# Patient Record
Sex: Male | Born: 1950
Health system: Southern US, Community
[De-identification: ages and names within clinical notes are randomized; demographics above are authoritative.]

## PROBLEM LIST (undated history)

## (undated) DIAGNOSIS — I10 Essential (primary) hypertension: Secondary | ICD-10-CM

## (undated) DIAGNOSIS — K644 Residual hemorrhoidal skin tags: Secondary | ICD-10-CM

## (undated) HISTORY — DX: Residual hemorrhoidal skin tags: K64.4

## (undated) HISTORY — DX: Essential (primary) hypertension: I10

## (undated) HISTORY — PX: APPENDECTOMY: SHX54

## (undated) HISTORY — PX: INGUINAL HERNIA REPAIR: SUR1180

## (undated) HISTORY — PX: KNEE SURGERY: SHX244

---

## 2004-12-18 ENCOUNTER — Ambulatory Visit (HOSPITAL_BASED_OUTPATIENT_CLINIC_OR_DEPARTMENT_OTHER): Admission: RE | Admit: 2004-12-18 | Discharge: 2004-12-18 | Payer: Self-pay | Admitting: General Surgery

## 2004-12-18 ENCOUNTER — Ambulatory Visit (HOSPITAL_COMMUNITY): Admission: RE | Admit: 2004-12-18 | Discharge: 2004-12-18 | Payer: Self-pay | Admitting: General Surgery

## 2010-12-07 ENCOUNTER — Emergency Department (HOSPITAL_COMMUNITY)
Admission: EM | Admit: 2010-12-07 | Discharge: 2010-12-07 | Disposition: A | Discharge status: Home / Self Care | Payer: Self-pay | Attending: Emergency Medicine | Admitting: Emergency Medicine

## 2010-12-07 DIAGNOSIS — K644 Residual hemorrhoidal skin tags: Secondary | ICD-10-CM | POA: Insufficient documentation

## 2010-12-07 DIAGNOSIS — K921 Melena: Secondary | ICD-10-CM | POA: Insufficient documentation

## 2010-12-07 DIAGNOSIS — K5900 Constipation, unspecified: Secondary | ICD-10-CM | POA: Insufficient documentation

## 2010-12-07 DIAGNOSIS — K6289 Other specified diseases of anus and rectum: Secondary | ICD-10-CM | POA: Insufficient documentation

## 2010-12-08 ENCOUNTER — Encounter: Payer: Self-pay | Admitting: Internal Medicine

## 2010-12-30 ENCOUNTER — Encounter: Payer: Self-pay | Admitting: Internal Medicine

## 2010-12-30 ENCOUNTER — Ambulatory Visit (INDEPENDENT_AMBULATORY_CARE_PROVIDER_SITE_OTHER): Payer: Self-pay | Admitting: Internal Medicine

## 2010-12-30 DIAGNOSIS — K625 Hemorrhage of anus and rectum: Secondary | ICD-10-CM

## 2010-12-30 DIAGNOSIS — K219 Gastro-esophageal reflux disease without esophagitis: Secondary | ICD-10-CM | POA: Insufficient documentation

## 2010-12-30 DIAGNOSIS — K644 Residual hemorrhoidal skin tags: Secondary | ICD-10-CM

## 2010-12-30 DIAGNOSIS — K5909 Other constipation: Secondary | ICD-10-CM | POA: Insufficient documentation

## 2010-12-30 MED ORDER — PEG-KCL-NACL-NASULF-NA ASC-C 100 G PO SOLR: 1.0000 | Freq: Once | ORAL | Status: DC

## 2010-12-30 NOTE — Progress Notes (Signed)
Subjective:    Patient ID: Wesley Bright, male    DOB: 1950-07-09, 60 y.o.   MRN: [id number]  HPI Wesley Bright is a 60 year old man who presents to clinic for the first time today accompanied by his daughter for evaluation of hemorrhoidal bleeding.  The patient was seen in the [facility] emergency department in late July for the same problem. At that time he reports he was having pain and bright red rectal bleeding with bowel movement. He was given a prescription for ProctoFoam and reports that this helped his symptoms. He has since stopped having rectal bleeding and is no longer having painful defecation.  He is no longer using ProctoFoam but he is using Preparation H as needed.  At that time he was also started on MiraLax 17 g a day and he reports this helps with constipation.  He reports that he is still having trouble with constipation and reports that his stools are small. He reports that he is having bowel movements 3-4 days per week, having prior daily bowel movement. He is not taking the MiraLax on a regular basis and he is at times using a mint tea to help with his constipation. He also reports intermittent thin stools and the feeling of abdominal fullness and bloating.  He feels like the changes in his bowel habits or diet related. He reports when he eats food his wife cooks he usually has regular bowel movements however when he eats "American food" he becomes irregular. He has not had nausea vomiting or any abdominal pain. His appetite remains good he has not lost weight. He does report intermittent heartburn but prior to the last year he was having heartburn more frequently. No dysphagia or odynophagia.  Of note, he does report years of trouble with hemorrhoids but prior to July 2012 bleeding and pain were never an issue.   Review of Systems  Constitutional: Positive for fever. Negative for chills and fatigue.  Eyes: Negative for pain and visual disturbance.  Respiratory: Negative for cough,  chest tightness and shortness of breath.   Cardiovascular: Negative for chest pain and leg swelling.  Gastrointestinal:       See HPI  Genitourinary: Positive for difficulty urinating. Negative for flank pain.  Musculoskeletal: Negative for back pain, joint swelling and arthralgias.  Skin: Negative for color change and rash.  Neurological: Negative for weakness, numbness and headaches.  Hematological: Does not bruise/bleed easily.  Psychiatric/Behavioral: Negative for dysphoric mood. The patient is not nervous/anxious.    SH: Works at the [employer]. 1/2 pk/day cigarettes. Prior history of heavy drinking but now only rarely drinks alcohol. Denies illicit drug use. FH: His father had esophageal cancer diagnosed in his late 55s, his brother had liver cancer in the setting of alcoholic cirrhosis. No report of colorectal cancer or polyps.     Objective:     Physical Exam  Constitutional: He is oriented to person, place, and time. He appears well-developed and well-nourished. No distress.  HENT:  Head: Normocephalic and atraumatic.  Mouth/Throat: No oropharyngeal exudate.  Eyes: EOM are normal. Pupils are equal, round, and reactive to light. No scleral icterus.  Neck: Normal range of motion. Neck supple. No tracheal deviation present.  Cardiovascular: Normal rate, regular rhythm and normal heart sounds.   Pulmonary/Chest: Effort normal and breath sounds normal. He has no wheezes.  Abdominal: Soft. Bowel sounds are normal. He exhibits no distension and no mass. There is no tenderness.  Musculoskeletal: He exhibits no edema.  Neurological: He  is alert and oriented to person, place, and time.  Skin: Skin is warm and dry. No rash noted.  Psychiatric: He has a normal mood and affect.       Assessment & Plan:  This is a 60 year old man with little past medical history who presents for evaluation of recent painful hemorrhoidal bleeding as well as change in bowel habits.  1. Hemorrhoidal  bleeding/change in stool - it is very likely that the pain and bleeding that he's been experiencing is related to hemorrhoids. However given his change in bowel habits along with his age we will proceed with colonoscopy.  I would like for him to go back on MiraLax 17 g a day in an attempt to avoid constipation. I think the bloating and fullness that he is feeling is related to constipation and not being able to effectively empty his bowels.  I have advised that he is taking this on a daily basis and still feels constipated and he can take it twice daily.  I will better evaluate his hemorrhoids at the colonoscopy and determine if additional medical therapy is needed.  2. GERD - the patient in the past has had daily symptoms but now has symptoms more intermittently. Given his long-standing history of gastroesophageal reflux and family history of esophageal cancer I will perform upper endoscopy on the same day as his colonoscopy. He can take over-the-counter H2 blocker such as famotidine as needed reflux.  Should symptoms become more daily then we would consider PPI therapy.  --EGD/colon --daily miralax

## 2010-12-30 NOTE — Patient Instructions (Signed)
You have been scheduled for an Upper Endoscopy/Colonoscopy for tomorrow.  Separate instructions given. Pick up your prep kit from your pharmacy.

## 2010-12-31 ENCOUNTER — Ambulatory Visit (AMBULATORY_SURGERY_CENTER): Payer: Self-pay | Admitting: Internal Medicine

## 2010-12-31 ENCOUNTER — Encounter: Payer: Self-pay | Admitting: Internal Medicine

## 2010-12-31 DIAGNOSIS — K2100 Gastro-esophageal reflux disease with esophagitis, without bleeding: Secondary | ICD-10-CM

## 2010-12-31 DIAGNOSIS — K219 Gastro-esophageal reflux disease without esophagitis: Secondary | ICD-10-CM

## 2010-12-31 DIAGNOSIS — K2940 Chronic atrophic gastritis without bleeding: Secondary | ICD-10-CM

## 2010-12-31 DIAGNOSIS — D126 Benign neoplasm of colon, unspecified: Secondary | ICD-10-CM

## 2010-12-31 DIAGNOSIS — K625 Hemorrhage of anus and rectum: Secondary | ICD-10-CM

## 2010-12-31 MED ORDER — SODIUM CHLORIDE 0.9 % IV SOLN: 500.0000 mL | INTRAVENOUS | Status: DC

## 2010-12-31 NOTE — Progress Notes (Signed)
PT STATES THAT HIS ABDOMEN IS CRAMPING. ENCOURAGED PT TO LAY ON LEFT SIDE, PULLS KNEES TO CHEST AND BARE DOWN TO PASS GAS. PT DID PASS MINIMAL GAS

## 2010-12-31 NOTE — Progress Notes (Signed)
MEDICINE TITRATED PER MD DURING PROCEDURE. ADDITIONAL FENTANYL OF ORDERED PER DR PYRTLE AFTER INITIAL 100 MCG FENTANYL AND 10MG   VERSED DUE TO PT MOANING AND YELLING OUT IN PAIN. EWM  PT WOKE AT THE END OF PROCEDURE AFTER SLEEPING AFTER REACHING CECUM, STATING HE IS IN PAIN STILL. DR PYRTLE QUESTIONED WHETHER HE WAS FEELING SCOPE IRRITATING HEMORRHOIDS. PT DID GRIMACE AND COMPLAIN OF PAIN WITH END OF PROCEDURE AND MD AWARE BUT NO FURTHER SEDATION GIVEN. AFTER PROCEDURE PT STATED HE WASN'T OK, AND REPOSITIONED TO LEFT SIDE. PT  ENCOURAGED TO PASS GAS. MAY BE A LANGUAGE BARRIER DUE TO [place] AND SPEAKS SOME BUT LITTLE ENGLISH. EWM

## 2010-12-31 NOTE — Progress Notes (Signed)
PT PASSED GAS MULTIPLE TIMES. PT STATES THE HIS CRAMPING IS MUCH BETTER, ALMOST RESOLVED.

## 2010-12-31 NOTE — Patient Instructions (Signed)
Please refer to your blue and neon green sheets for instructions regarding diet and activity for the rest of today.  You may resume your medications as you would normally take them.  You will receive a letter in the mail in about 2 weeks regarding the results of your pathology, if H Pylori results are positive, Dr Lauro Franklin office will contact you regarding treatment.

## 2011-01-01 ENCOUNTER — Telehealth: Payer: Self-pay | Admitting: Certified Registered Nurse Anesthetist

## 2011-01-01 NOTE — Telephone Encounter (Signed)
No answer

## 2011-01-05 ENCOUNTER — Encounter: Payer: Self-pay | Admitting: Internal Medicine

## 2011-11-11 ENCOUNTER — Encounter: Payer: Self-pay | Admitting: Internal Medicine

## 2012-08-30 ENCOUNTER — Encounter: Payer: Self-pay | Admitting: Internal Medicine

## 2016-05-18 DIAGNOSIS — C9590 Leukemia, unspecified not having achieved remission: Secondary | ICD-10-CM

## 2016-05-18 HISTORY — DX: Leukemia, unspecified not having achieved remission: C95.90

## 2016-12-07 ENCOUNTER — Ambulatory Visit (INDEPENDENT_AMBULATORY_CARE_PROVIDER_SITE_OTHER): Payer: BLUE CROSS/BLUE SHIELD | Admitting: Emergency Medicine

## 2016-12-07 ENCOUNTER — Encounter: Payer: Self-pay | Admitting: Emergency Medicine

## 2016-12-07 ENCOUNTER — Ambulatory Visit (INDEPENDENT_AMBULATORY_CARE_PROVIDER_SITE_OTHER): Payer: BLUE CROSS/BLUE SHIELD

## 2016-12-07 VITALS — BP 121/73 | HR 73 | Temp 97.7°F | Resp 16 | Ht 69.25 in | Wt 189.4 lb

## 2016-12-07 DIAGNOSIS — C9190 Lymphoid leukemia, unspecified not having achieved remission: Secondary | ICD-10-CM

## 2016-12-07 DIAGNOSIS — D72829 Elevated white blood cell count, unspecified: Secondary | ICD-10-CM | POA: Diagnosis not present

## 2016-12-07 DIAGNOSIS — R634 Abnormal weight loss: Secondary | ICD-10-CM | POA: Diagnosis not present

## 2016-12-07 DIAGNOSIS — R531 Weakness: Secondary | ICD-10-CM | POA: Diagnosis not present

## 2016-12-07 DIAGNOSIS — F17200 Nicotine dependence, unspecified, uncomplicated: Secondary | ICD-10-CM | POA: Insufficient documentation

## 2016-12-07 DIAGNOSIS — C9110 Chronic lymphocytic leukemia of B-cell type not having achieved remission: Secondary | ICD-10-CM

## 2016-12-07 LAB — POCT CBC
HCT, POC: 343.5 % — AB (ref 43.5–53.7)
Hemoglobin: 14 g/dL — AB (ref 14.1–18.1)
MCH, POC: 29.2 pg (ref 27–31.2)
MCHC: 32.3 g/dL (ref 31.8–35.4)
MCV: 90.5 fL (ref 80–97)
MPV: 7.8 fL (ref 0–99.8)
Platelet Count, POC: 131 10*3/uL — AB (ref 142–424)
RBC: 4.81 M/uL (ref 4.69–6.13)
RDW, POC: 14.2 %
WBC: 142.3 10*3/uL — AB (ref 4.6–10.2)

## 2016-12-07 LAB — POCT GLYCOSYLATED HEMOGLOBIN (HGB A1C): HEMOGLOBIN A1C: 5.4

## 2016-12-07 NOTE — Patient Instructions (Addendum)
IF you received an x-ray today, you will receive an invoice from [facility]. Please contact [facility] at [phone] with questions or concerns regarding your invoice.   IF you received labwork today, you will receive an invoice from [facility]. Please contact [facility] at [phone] with questions or concerns regarding your invoice.   Our billing staff will not be able to assist you with questions regarding bills from these companies.  You will be contacted with the lab results as soon as they are available. The fastest way to get your results is to activate your My Chart account. Instructions are located on the last page of this paperwork. If you have not heard from [place] regarding the results in 2 weeks, please contact this office.     Myelodysplastic Syndrome Myelodysplastic syndrome (MDS) is a group of cancers that affect blood cells. New blood cells are also called immature cells or stem cells. MDS starts in the bone marrow where new blood cells are made. The bone marrow makes:  Red blood cells. These carry oxygen.  White blood cells. These fight infection.  Platelets. These stop bleeding.  With MDS, some immature cells do not grow into adult blood cells. These cells will die before they reach maturity. Immature blood cells build up in the bone marrow and crowd out normal adult blood cells. This causes a low blood count and can affect how many healthy red blood cells, white blood cells, and platelets you have. If you have too few red blood cells, your blood may not have enough oxygen, and you will feel tired. If you do not have enough white blood cells, you might get infections often. If you have too few platelets, you may bruise or bleed easily. What are the causes? This condition may be caused by:  Being exposed to certain medicines or chemicals.  Radiation treatment.  In some cases, the cause may not be known. What increases the risk? This condition is  more likely to develop in:  Caucasians.  Males.  People who are 65 years of age or older.  People who have been treated with cancer medicines or radiation.  People who have been exposed to tobacco smoke, pesticides, lead, mercury, or the benzene chemical.  People with a family history of MDS.  People who inherit certain abnormal genes that lead to bone marrow problems.  What are the signs or symptoms? Symptoms often develop slowly over time. Symptoms depend on which blood cells are affected. Symptoms include:  Being tired all the time.  Feeling out of breath.  Having pale skin.  Getting infections often.  Having pain in the bones.  Feeling weak.  Having a fever often.  Bruising easily.  Bleeding that lasts longer than normal.  Having tiny spots of bleeding under the skin (petechiae).  How is this diagnosed? This condition is diagnosed based on your symptoms, medical history, and physical exam. You may also have tests, including:  A complete blood count. This test counts the number of red, white, and platelet blood cells.  Peripheral blood smear. This test checks the number and type of blood cells. It also checks their size and shape.  Bone marrow aspiration and biopsy. Aspiration involves using a needle to remove the liquid portion of the bone marrow, and a biopsy involves removing the bone marrow tissue. The samples removed will be examined under a microscope.  How is this treated? There is no cure for MDS. Treatment focuses on relieving your symptoms and  slowing down the production of immature blood cells. The type of treatment that is best for you depends on the type of MDS you have. Treatment may include:  Blood transfusions.  Chemotherapy. This is the use of medicines to kill cancer cells.  Targeted therapy. This treatment targets specific parts of cancer cells and the area around them to block the growth and spread of the cancer.  Immunotherapy or  biologic therapy. This treatment helps your body boost its own immune system and natural defenses to stop the growth and spread of cancer cells.  Medicines that: ? Slow down the number of immature blood cells that are made. ? Help immature cells develop into adult cells. ? Make more blood cells. ? Treat infections (antibiotics).  Bone marrow stem cell transplant. In this procedure, your stem cells will be replaced with healthy stem cells from a donor.  Follow these instructions at home: Medicines  Take over-the-counter and prescription medicines only as told by your health care provider.  Do not take aspirin unless your health care provider approves. Aspirin can make bleeding more likely.  If you were prescribed an antibiotic, take it as told by your health care provider. Do not stop taking the antibiotic even if you start to feel better. Lifestyle   Avoid dangerous activities that could lead to bleeding. Ask your health care provider what activities are safe for you.  Wash all fruits and vegetables before eating them. Make sure meat and eggs are well cooked. Do not eat raw fish and shellfish.  Wash your hands often with soap and water. If soap and water are not available, use hand sanitizer.  Do not use any tobacco products, such as cigarettes, chewing tobacco, and e-cigarettes. If you need help quitting, ask your health care provider. General instructions  Stay away from people who are sick.  Keep all follow-up visits as told by your health care provider. This is important.  Talk to your health care provider before having any medical or dental procedure. Contact a health care provider if:  You feel more tired than normal.  You have more bruising than usual.  You have trouble catching your breath.  You have any symptoms of infection, such as: ? Fever. ? Chills. ? Abnormal urinary symptoms. ? Cough. ? Body aches. ? Headache. ? Weakness. ? Dizziness. Get help right  away if:  Your symptoms get worse.  You have bleeding that does not stop.  You have trouble breathing.  You have chest pain. This information is not intended to replace advice given to you by your health care provider. Make sure you discuss any questions you have with your health care provider. Document Released: 11/03/2011 Document Revised: 12/28/2015 Document Reviewed: 05/19/2015 Elsevier Interactive Patient Education  [employer].

## 2016-12-07 NOTE — Progress Notes (Addendum)
Wesley Bright 66 y.o.   Chief Complaint  Patient presents with  . Fatigue    2-3 months  . Weight Loss    HISTORY OF PRESENT ILLNESS: This is a 66 y.o. male complaining of feeling tired and fatigued for 4-5 months along with weight loss, 15 lbs in 5 months.  HPI   Prior to Admission medications   Medication Sig Start Date End Date Taking? Authorizing Provider  ibuprofen (ADVIL,MOTRIN) 600 MG tablet Take 600 mg by mouth 3 (three) times daily with meals.     Yes [provider]  hydrocortisone 1 % cream Apply 1 application topically 2 (two) times daily.      [provider]  hydrocortisone-pramoxine (PROCTOFOAM HC) rectal foam Place 1 applicator rectally 3 (three) times daily.      [provider]  polyethylene glycol powder (GLYCOLAX/MIRALAX) powder Take 17 g by mouth daily.      [provider]    No Known Allergies  Patient Active Problem List   Diagnosis Date Noted  . GERD (gastroesophageal reflux disease) 12/30/2010  . Constipation 12/30/2010  . Hemorrhoids, external 12/30/2010    Past Medical History:  Diagnosis Date  . External hemorrhoid     Past Surgical History:  Procedure Laterality Date  . APPENDECTOMY    . INGUINAL HERNIA REPAIR     left  . KNEE SURGERY     Left Knee    Social History   Social History  . Marital status: Married    Spouse name: N/A  . Number of children: 2  . Years of education: N/A   Occupational History  . 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 Smoking status: Current Every Day Smoker    Packs/day: 0.50  . Smokeless tobacco: Never Used  . Alcohol use Yes     Comment: occasional  . Drug use: No  . Sexual activity: Not on file   Other Topics Concern  . Not on file   Social History Narrative  . No narrative on file    Family History  Problem Relation Age of Onset  . Liver cancer Brother   . Esophageal cancer Father      Review of Systems    Constitutional: Positive for malaise/fatigue and weight loss. Negative for chills and fever.  HENT: Negative.  Negative for congestion, ear pain, nosebleeds and sore throat.   Eyes: Negative.  Negative for blurred vision and double vision.  Respiratory: Negative.  Negative for cough, shortness of breath and wheezing.   Cardiovascular: Negative.  Negative for chest pain, claudication and leg swelling.  Gastrointestinal: Negative.  Negative for abdominal pain, blood in stool, diarrhea, nausea and vomiting.  Genitourinary: Negative.  Negative for dysuria and hematuria.  Musculoskeletal: Negative for back pain, myalgias and neck pain.  Skin: Negative for rash.  Neurological: Positive for weakness. Negative for dizziness, sensory change, focal weakness and headaches.  Endo/Heme/Allergies: Negative.   All other systems reviewed and are negative.  Vitals:   12/07/16 1324  BP: 121/73  Pulse: 73  Resp: 16  Temp: 97.7 F (36.5 C)     Physical Exam  Constitutional: He is oriented to person, place, and time. He appears well-developed and well-nourished.  HENT:  Head: Normocephalic and atraumatic.  Right Ear: External ear normal.  Left Ear: External ear normal.  Nose: Nose normal.  Mouth/Throat: Oropharynx is clear and moist.  Eyes: Pupils are equal, round, and reactive to light. Conjunctivae and EOM are  normal.  Neck: Normal range of motion. Neck supple. No JVD present.  Cardiovascular: Normal rate, regular rhythm, normal heart sounds and intact distal pulses.   Pulmonary/Chest: Effort normal and breath sounds normal.  Abdominal: Soft. Bowel sounds are normal. He exhibits no distension and no mass. There is no tenderness. No hernia.  Musculoskeletal: Normal range of motion.  Lymphadenopathy:    He has no cervical adenopathy.  Neurological: He is alert and oriented to person, place, and time. No sensory deficit. He exhibits normal muscle tone.  Skin: Skin is warm and dry. Capillary  refill takes less than 2 seconds. No rash noted.  Psychiatric: He has a normal mood and affect. His behavior is normal.  Vitals reviewed.  Dg Chest 2 View  Result Date: 12/07/2016 CLINICAL DATA:  Smoker.  Weight loss. EXAM: CHEST  2 VIEW COMPARISON:  06/06/2007 FINDINGS: Heart size is normal. There are no focal consolidations or pleural effusions. No pulmonary edema. Visualized osseous structures have a normal appearance. IMPRESSION: No evidence for acute cardiopulmonary abnormality. Electronically Signed   By: Nolon Nations M.D.   On: 12/07/2016 13:51   Results for orders placed or performed in visit on 12/07/16 (from the past 24 hour(s))  POCT glycosylated hemoglobin (Hb A1C)     Status: None   Collection Time: 12/07/16  2:04 PM  Result Value Ref Range   Hemoglobin A1C 5.4   POCT CBC     Status: Abnormal   Collection Time: 12/07/16  2:06 PM  Result Value Ref Range   WBC 142.3 (A) 4.6 - 10.2 K/uL   Lymph, poc  0.6 - 3.4   POC LYMPH PERCENT  10 - 50 %L   MID (cbc)  0 - 0.9   POC MID %  0 - 12 %M   POC Granulocyte  2 - 6.9   Granulocyte percent  37 - 80 %G   RBC 4.81 4.69 - 6.13 M/uL   Hemoglobin 14.0 (A) 14.1 - 18.1 g/dL   HCT, POC 343.5 (A) 43.5 - 53.7 %   MCV 90.5 80 - 97 fL   MCH, POC 29.2 27 - 31.2 pg   MCHC 32.3 31.8 - 35.4 g/dL   RDW, POC 14.2 %   Platelet Count, POC 131 (A) 142 - 424 K/uL   MPV 7.8 0 - 99.8 fL      ASSESSMENT & PLAN: Wesley Bright was seen today for fatigue and weight loss.  Diagnoses and all orders for this visit:  General weakness -     Comprehensive metabolic panel -     POCT CBC -     POCT glycosylated hemoglobin (Hb A1C) -     CBC with Differential/Platelet  Smoker -     DG Chest 2 View; Future  Loss of weight -     PSA  Leukocytosis, unspecified type Comments: leukemia most likely Orders: -     Ambulatory referral to Hematology -     CBC with Differential/Platelet  CLL (chronic lymphocytic leukemia) ([place])   Case also discussed  over the phone with daughter as requested by patient and wife. 12/08/16 CBC with Diff report reviewed. WBC's mostly atypical lymphocytes with smudge cells. Findings c/w CLL. Scheduled to see Dr. Lebron Conners (Oncology/Hematology) 7/31.   Patient Instructions       IF you received an x-ray today, you will receive an invoice from [facility]. Please contact [facility] at [phone] with questions or concerns regarding your invoice.   IF you received labwork today,  you will receive an invoice from [facility]. Please contact [facility] at [phone] with questions or concerns regarding your invoice.   Our billing staff will not be able to assist you with questions regarding bills from these companies.  You will be contacted with the lab results as soon as they are available. The fastest way to get your results is to activate your My Chart account. Instructions are located on the last page of this paperwork. If you have not heard from [place] regarding the results in 2 weeks, please contact this office.     Myelodysplastic Syndrome Myelodysplastic syndrome (MDS) is a group of cancers that affect blood cells. New blood cells are also called immature cells or stem cells. MDS starts in the bone marrow where new blood cells are made. The bone marrow makes:  Red blood cells. These carry oxygen.  White blood cells. These fight infection.  Platelets. These stop bleeding.  With MDS, some immature cells do not grow into adult blood cells. These cells will die before they reach maturity. Immature blood cells build up in the bone marrow and crowd out normal adult blood cells. This causes a low blood count and can affect how many healthy red blood cells, white blood cells, and platelets you have. If you have too few red blood cells, your blood may not have enough oxygen, and you will feel tired. If you do not have enough white blood cells, you might get infections often. If you have too few  platelets, you may bruise or bleed easily. What are the causes? This condition may be caused by:  Being exposed to certain medicines or chemicals.  Radiation treatment.  In some cases, the cause may not be known. What increases the risk? This condition is more likely to develop in:  Caucasians.  Males.  People who are 54 years of age or older.  People who have been treated with cancer medicines or radiation.  People who have been exposed to tobacco smoke, pesticides, lead, mercury, or the benzene chemical.  People with a family history of MDS.  People who inherit certain abnormal genes that lead to bone marrow problems.  What are the signs or symptoms? Symptoms often develop slowly over time. Symptoms depend on which blood cells are affected. Symptoms include:  Being tired all the time.  Feeling out of breath.  Having pale skin.  Getting infections often.  Having pain in the bones.  Feeling weak.  Having a fever often.  Bruising easily.  Bleeding that lasts longer than normal.  Having tiny spots of bleeding under the skin (petechiae).  How is this diagnosed? This condition is diagnosed based on your symptoms, medical history, and physical exam. You may also have tests, including:  A complete blood count. This test counts the number of red, white, and platelet blood cells.  Peripheral blood smear. This test checks the number and type of blood cells. It also checks their size and shape.  Bone marrow aspiration and biopsy. Aspiration involves using a needle to remove the liquid portion of the bone marrow, and a biopsy involves removing the bone marrow tissue. The samples removed will be examined under a microscope.  How is this treated? There is no cure for MDS. Treatment focuses on relieving your symptoms and slowing down the production of immature blood cells. The type of treatment that is best for you depends on the type of MDS you have. Treatment may  include:  Blood transfusions.  Chemotherapy. This is the  use of medicines to kill cancer cells.  Targeted therapy. This treatment targets specific parts of cancer cells and the area around them to block the growth and spread of the cancer.  Immunotherapy or biologic therapy. This treatment helps your body boost its own immune system and natural defenses to stop the growth and spread of cancer cells.  Medicines that: ? Slow down the number of immature blood cells that are made. ? Help immature cells develop into adult cells. ? Make more blood cells. ? Treat infections (antibiotics).  Bone marrow stem cell transplant. In this procedure, your stem cells will be replaced with healthy stem cells from a donor.  Follow these instructions at home: Medicines  Take over-the-counter and prescription medicines only as told by your health care provider.  Do not take aspirin unless your health care provider approves. Aspirin can make bleeding more likely.  If you were prescribed an antibiotic, take it as told by your health care provider. Do not stop taking the antibiotic even if you start to feel better. Lifestyle   Avoid dangerous activities that could lead to bleeding. Ask your health care provider what activities are safe for you.  Wash all fruits and vegetables before eating them. Make sure meat and eggs are well cooked. Do not eat raw fish and shellfish.  Wash your hands often with soap and water. If soap and water are not available, use hand sanitizer.  Do not use any tobacco products, such as cigarettes, chewing tobacco, and e-cigarettes. If you need help quitting, ask your health care provider. General instructions  Stay away from people who are sick.  Keep all follow-up visits as told by your health care provider. This is important.  Talk to your health care provider before having any medical or dental procedure. Contact a health care provider if:  You feel more tired than  normal.  You have more bruising than usual.  You have trouble catching your breath.  You have any symptoms of infection, such as: ? Fever. ? Chills. ? Abnormal urinary symptoms. ? Cough. ? Body aches. ? Headache. ? Weakness. ? Dizziness. Get help right away if:  Your symptoms get worse.  You have bleeding that does not stop.  You have trouble breathing.  You have chest pain. This information is not intended to replace advice given to you by your health care provider. Make sure you discuss any questions you have with your health care provider. Document Released: 11/03/2011 Document Revised: 12/28/2015 Document Reviewed: 05/19/2015 Elsevier Interactive Patient Education  2018 [employer].     Agustina Caroli, MD [facility]

## 2016-12-08 ENCOUNTER — Telehealth: Payer: Self-pay | Admitting: Hematology and Oncology

## 2016-12-08 ENCOUNTER — Encounter: Payer: Self-pay | Admitting: Hematology and Oncology

## 2016-12-08 ENCOUNTER — Encounter: Payer: Self-pay | Admitting: Emergency Medicine

## 2016-12-08 LAB — CBC WITH DIFFERENTIAL/PLATELET
Basophils Absolute: 0.6 10*3/uL — ABNORMAL HIGH (ref 0.0–0.2)
Basos: 0 %
EOS (ABSOLUTE): 0.3 10*3/uL (ref 0.0–0.4)
Eos: 0 %
Hematocrit: 42.6 % (ref 37.5–51.0)
Hemoglobin: 13.3 g/dL (ref 13.0–17.7)
IMMATURE GRANS (ABS): 0 10*3/uL (ref 0.0–0.1)
Immature Granulocytes: 0 %
LYMPHS: 96 %
Lymphocytes Absolute: 140.2 10*3/uL — ABNORMAL HIGH (ref 0.7–3.1)
MCH: 29.2 pg (ref 26.6–33.0)
MCHC: 31.2 g/dL — ABNORMAL LOW (ref 31.5–35.7)
MCV: 94 fL (ref 79–97)
Monocytes Absolute: 2.4 10*3/uL — ABNORMAL HIGH (ref 0.1–0.9)
Monocytes: 2 %
NEUTROS ABS: 2.9 10*3/uL (ref 1.4–7.0)
Neutrophils: 2 %
PLATELETS: 123 10*3/uL — AB (ref 150–379)
RBC: 4.55 x10E6/uL (ref 4.14–5.80)
RDW: 14.4 % (ref 12.3–15.4)
WBC: 146.5 10*3/uL (ref 3.4–10.8)

## 2016-12-08 LAB — COMPREHENSIVE METABOLIC PANEL
A/G RATIO: 2.2 (ref 1.2–2.2)
ALT: 14 IU/L (ref 0–44)
AST: 17 IU/L (ref 0–40)
Albumin: 4.3 g/dL (ref 3.6–4.8)
Alkaline Phosphatase: 107 IU/L (ref 39–117)
BILIRUBIN TOTAL: 0.3 mg/dL (ref 0.0–1.2)
BUN/Creatinine Ratio: 8 — ABNORMAL LOW (ref 10–24)
BUN: 11 mg/dL (ref 8–27)
CHLORIDE: 108 mmol/L — AB (ref 96–106)
CO2: 22 mmol/L (ref 20–29)
Calcium: 8.8 mg/dL (ref 8.6–10.2)
Creatinine, Ser: 1.3 mg/dL — ABNORMAL HIGH (ref 0.76–1.27)
GFR calc non Af Amer: 58 mL/min/{1.73_m2} — ABNORMAL LOW (ref >59)
GFR, EST AFRICAN AMERICAN: 67 mL/min/{1.73_m2} (ref >59)
Globulin, Total: 2 g/dL (ref 1.5–4.5)
Glucose: 91 mg/dL (ref 65–99)
POTASSIUM: 4.7 mmol/L (ref 3.5–5.2)
Sodium: 145 mmol/L — ABNORMAL HIGH (ref 134–144)
Total Protein: 6.3 g/dL (ref 6.0–8.5)

## 2016-12-08 LAB — PSA: Prostate Specific Ag, Serum: 5.1 ng/mL — ABNORMAL HIGH (ref 0.0–4.0)

## 2016-12-08 NOTE — Telephone Encounter (Signed)
Appt has been scheduled for the pt to see Dr. Lebron Conners on 7/31 at 10am. Aware to arrive 30 minutes early. Letter mailed.

## 2016-12-08 NOTE — Telephone Encounter (Signed)
Please look into Hematology/Oncology referral and schedule ASAP. Call patient to let them know.

## 2016-12-09 DIAGNOSIS — C9110 Chronic lymphocytic leukemia of B-cell type not having achieved remission: Secondary | ICD-10-CM | POA: Insufficient documentation

## 2016-12-15 ENCOUNTER — Ambulatory Visit (HOSPITAL_BASED_OUTPATIENT_CLINIC_OR_DEPARTMENT_OTHER): Payer: BLUE CROSS/BLUE SHIELD | Admitting: Hematology and Oncology

## 2016-12-15 ENCOUNTER — Telehealth: Payer: Self-pay

## 2016-12-15 ENCOUNTER — Other Ambulatory Visit (HOSPITAL_COMMUNITY)
Admission: RE | Admit: 2016-12-15 | Discharge: 2016-12-15 | Disposition: A | Discharge status: Home / Self Care | Payer: BLUE CROSS/BLUE SHIELD | Source: Ambulatory Visit | Attending: Hematology and Oncology | Admitting: Hematology and Oncology

## 2016-12-15 ENCOUNTER — Ambulatory Visit (HOSPITAL_BASED_OUTPATIENT_CLINIC_OR_DEPARTMENT_OTHER): Payer: BLUE CROSS/BLUE SHIELD

## 2016-12-15 ENCOUNTER — Encounter: Payer: Self-pay | Admitting: Hematology and Oncology

## 2016-12-15 VITALS — BP 139/69 | HR 71 | Temp 98.6°F | Resp 18 | Ht 70.5 in | Wt 191.9 lb

## 2016-12-15 DIAGNOSIS — C9190 Lymphoid leukemia, unspecified not having achieved remission: Secondary | ICD-10-CM | POA: Insufficient documentation

## 2016-12-15 DIAGNOSIS — C9110 Chronic lymphocytic leukemia of B-cell type not having achieved remission: Secondary | ICD-10-CM | POA: Diagnosis not present

## 2016-12-15 LAB — CBC & DIFF AND RETIC
BASO%: 0.4 % (ref 0.0–2.0)
Basophils Absolute: 0.6 10*3/uL — ABNORMAL HIGH (ref 0.0–0.1)
EOS%: 0.1 % (ref 0.0–7.0)
Eosinophils Absolute: 0.2 10*3/uL (ref 0.0–0.5)
HCT: 43.7 % (ref 38.4–49.9)
HGB: 13.7 g/dL (ref 13.0–17.1)
Immature Retic Fract: 2.3 % — ABNORMAL LOW (ref 3.00–10.60)
LYMPH%: 97.7 % — ABNORMAL HIGH (ref 14.0–49.0)
MCH: 29.5 pg (ref 27.2–33.4)
MCHC: 31.4 g/dL — ABNORMAL LOW (ref 32.0–36.0)
MCV: 94.2 fL (ref 79.3–98.0)
MONO#: 0.2 10*3/uL (ref 0.1–0.9)
MONO%: 0.1 % (ref 0.0–14.0)
NEUT%: 1.7 % — ABNORMAL LOW (ref 39.0–75.0)
NEUTROS ABS: 2.4 10*3/uL (ref 1.5–6.5)
NRBC: 0 % (ref 0–0)
Platelets: 109 10*3/uL — ABNORMAL LOW (ref 140–400)
RBC: 4.64 10*6/uL (ref 4.20–5.82)
RDW: 15 % — AB (ref 11.0–14.6)
Retic %: 0.7 % — ABNORMAL LOW (ref 0.80–1.80)
Retic Ct Abs: 32.48 10*3/uL — ABNORMAL LOW (ref 34.80–93.90)
WBC: 143.8 10*3/uL — AB (ref 4.0–10.3)
lymph#: 140.5 10*3/uL — ABNORMAL HIGH (ref 0.9–3.3)

## 2016-12-15 LAB — COMPREHENSIVE METABOLIC PANEL
ALT: 12 U/L (ref 0–55)
ANION GAP: 6 meq/L (ref 3–11)
AST: 18 U/L (ref 5–34)
Albumin: 3.8 g/dL (ref 3.5–5.0)
Alkaline Phosphatase: 104 U/L (ref 40–150)
BILIRUBIN TOTAL: 0.33 mg/dL (ref 0.20–1.20)
BUN: 11.6 mg/dL (ref 7.0–26.0)
CALCIUM: 9.1 mg/dL (ref 8.4–10.4)
CO2: 26 meq/L (ref 22–29)
CREATININE: 1.3 mg/dL (ref 0.7–1.3)
Chloride: 110 mEq/L — ABNORMAL HIGH (ref 98–109)
EGFR: 58 mL/min/{1.73_m2} — ABNORMAL LOW (ref >90)
Glucose: 93 mg/dl (ref 70–140)
Potassium: 4.4 mEq/L (ref 3.5–5.1)
Sodium: 142 mEq/L (ref 136–145)
TOTAL PROTEIN: 6.5 g/dL (ref 6.4–8.3)

## 2016-12-15 LAB — MORPHOLOGY: PLT EST: DECREASED

## 2016-12-15 LAB — LACTATE DEHYDROGENASE: LDH: 159 U/L (ref 125–245)

## 2016-12-15 LAB — URIC ACID: Uric Acid, Serum: 6.1 mg/dl (ref 2.6–7.4)

## 2016-12-15 NOTE — Telephone Encounter (Signed)
appts made and avs printed for patient,patient aware that they will get a call with scan appts,contrast also given  Wesley Bright

## 2016-12-15 NOTE — Progress Notes (Signed)
[facility] New Visit:  Assessment: CLL (chronic lymphocytic leukemia) ([place]) 66 year old male presenting with severe lymphocytic leukocytosis with atypical lymphocytes and smudge cells present on the peripheral blood smear suggestive of presence of chronic lymphocytic leukemia or small lymphocytic lymphoma. At the present time, patient only qualifies for Rai Stage 0 as he does not have significant enough thrombocytopenia, anemia, and no palpable lymphadenopathy. The initial phase of assessment will involve [place] confirming presence of the diagnosis as well as completion staging by obtaining additional imaging.  Once diagnosis is confirmed, I'm likely to recommend treatment for his disease due to current symptoms of severe fatigue as suspected be directly related to presence of the malignancy. The treatment will depend on the cytogenetics and molecular is off the underlying malignancy. Due to presence of thrombocytopenia, I also recommend patient to undergo a bone marrow biopsy  Plan: --Labs today as outlined below --CT N/C/A/P --Consult IR for BM Bx --RTC 1 week after CT scans  Voice recognition software was used and creation of this note. Despite my best effort at editing the text, some misspelling/errors may have occurred.   Orders Placed This Encounter  Procedures  . CT Soft Tissue Neck W Contrast    Standing Status:   Future    Standing Expiration Date:   12/15/2017    Order Specific Question:   If indicated for the ordered procedure, I authorize the administration of contrast media per Radiology protocol    Answer:   Yes    Order Specific Question:   Reason for Exam (SYMPTOM  OR DIAGNOSIS REQUIRED)    Answer:   Evaluation for pathological lymphadenopathy in context of CLL    Order Specific Question:   Preferred imaging location?    Answer:   [facility]    Order Specific Question:   Radiology Contrast Protocol - do NOT remove file path    Answer:    \\[email or web address]  . CT Chest W Contrast    Standing Status:   Future    Standing Expiration Date:   12/15/2017    Order Specific Question:   If indicated for the ordered procedure, I authorize the administration of contrast media per Radiology protocol    Answer:   Yes    Order Specific Question:   Reason for Exam (SYMPTOM  OR DIAGNOSIS REQUIRED)    Answer:   Evaluation for pathological lymphadenopathy in context of CLL    Order Specific Question:   Preferred imaging location?    Answer:   [facility]    Order Specific Question:   Radiology Contrast Protocol - do NOT remove file path    Answer:   \\[email or web address]  . CT Abdomen Pelvis W Contrast    Standing Status:   Future    Standing Expiration Date:   12/15/2017    Order Specific Question:   If indicated for the ordered procedure, I authorize the administration of contrast media per Radiology protocol    Answer:   Yes    Order Specific Question:   Reason for Exam (SYMPTOM  OR DIAGNOSIS REQUIRED)    Answer:   Evaluation for pathological lymphadenopathy in context of CLL    Order Specific Question:   Preferred imaging location?    Answer:   [facility]    Order Specific Question:   Radiology Contrast Protocol - do NOT remove file path    Answer:   \\[email or web address]  . CT Biopsy    Standing Status:  Future    Standing Expiration Date:   12/15/2017    Order Specific Question:   Lab orders requested (DO NOT place separate lab orders, these will be automatically ordered during procedure specimen collection):    Answer:   Cytology - Non Pap    Order Specific Question:   Lab orders requested (DO NOT place separate lab orders, these will be automatically ordered during procedure specimen collection):    Answer:   Surgical Pathology    Order Specific Question:   Reason for Exam (SYMPTOM  OR DIAGNOSIS REQUIRED)    Answer:   CLL evaluation     Order Specific Question:   Preferred imaging location?    Answer:   [facility]    Order Specific Question:   Radiology Contrast Protocol - do NOT remove file path    Answer:   \\[email or web address]  . CT BONE MARROW BIOPSY & ASPIRATION    Standing Status:   Future    Standing Expiration Date:   03/17/2018    Order Specific Question:   Reason for Exam (SYMPTOM  OR DIAGNOSIS REQUIRED)    Answer:   CLL evaluation    Order Specific Question:   Preferred imaging location?    Answer:   [facility]    Order Specific Question:   Radiology Contrast Protocol - do NOT remove file path    Answer:   \\[email or web address]  . CBC & Diff and Retic    Standing Status:   Future    Number of Occurrences:   1    Standing Expiration Date:   12/15/2017  . Morphology    Standing Status:   Future    Number of Occurrences:   1    Standing Expiration Date:   12/15/2017  . Lactate dehydrogenase (LDH)    Standing Status:   Future    Number of Occurrences:   1    Standing Expiration Date:   12/15/2017  . Comprehensive metabolic panel    Standing Status:   Future    Number of Occurrences:   1    Standing Expiration Date:   12/15/2017  . Uric acid    Standing Status:   Future    Number of Occurrences:   1    Standing Expiration Date:   12/15/2017  . FISH, CLL Prognostic Panel    Standing Status:   Future    Number of Occurrences:   1    Standing Expiration Date:   12/15/2017  . Cytogenetics, Peripheral Blood    Standing Status:   Future    Number of Occurrences:   1    Standing Expiration Date:   12/15/2017  . Flow Cytometry    Standing Status:   Future    Number of Occurrences:   1    Standing Expiration Date:   12/15/2017  . Haptoglobin    Standing Status:   Future    Number of Occurrences:   1    Standing Expiration Date:   12/15/2017  . QIG  (Quant. immunoglobulins  - IgG, IgA, IgM)    Standing Status:   Future    Number of Occurrences:    1    Standing Expiration Date:   12/15/2017  . Beta 2 microglobulin    Standing Status:   Future    Number of Occurrences:   1    Standing Expiration Date:   12/15/2017    All questions were answered.  . The patient knows to  call the clinic with any problems, questions or concerns.  This note was electronically signed.    History of Presenting Illness Wesley Bright 66 y.o. presenting to the [place] [facility] for Evaluation of lymphocytic leukocytosis. Patient initially presented to his primary care provider with complaints of progressive fatigue, excessive tiredness, decreasing ability to participate in the work and daily activities for the past 4-5 months. Patient also has had a weight loss of about 15 pounds over the same.  Time., Patient's wife has had infections such as a walking pneumonia, but patient did not have any fevers, chills or night sweats. Not to have any shortness of breath, chest pain, or cough. He denies swelling in the neck, armpits, or groin. Denies early satiety, nausea, or vomiting, but has been noticing increasing frequency of constipation bouts with severe abdominal distention and discomfort which was limiting his appetite.  Oncological/hematological History: --Labs, 12/07/16: WBC 147, ANC 2.9, ALC 140, Mono 2.4, Baso 0.6, Hgb 13.3, Plt 123; Peripheral blood smear positive for atypical lymphocytes and smudge cells.   Medical History: Past Medical History:  Diagnosis Date  . External hemorrhoid     Surgical History: Past Surgical History:  Procedure Laterality Date  . APPENDECTOMY    . INGUINAL HERNIA REPAIR     left  . KNEE SURGERY     Left Knee    Family History: Family History  Problem Relation Age of Onset  . Liver cancer Brother   . Cancer Brother        Liver  . Esophageal cancer Father   . Cancer Father        Throat   . Cancer Mother        Bladder - cause of death    Social History: Social History   Social History  . Marital status:  Married    Spouse name: N/A  . Number of children: 2  . Years of education: N/A   Occupational History  . 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 Smoking status: Current Every Day Smoker    Packs/day: 0.50  . Smokeless tobacco: Never Used  . Alcohol use Yes     Comment: occasional  . Drug use: No  . Sexual activity: Not on file   Other Topics Concern  . Not on file   Social History Narrative  . No narrative on file    Allergies: No Known Allergies  Medications:  No current outpatient prescriptions on file.   No current facility-administered medications for this visit.     Review of Systems: Review of Systems  Constitutional: Positive for appetite change, fatigue and unexpected weight change. Negative for diaphoresis and fever.  HENT:   Negative for hearing loss, lump/mass, mouth sores, nosebleeds, sore throat, tinnitus, trouble swallowing and voice change.   Eyes: Negative for eye problems and icterus.  Respiratory: Negative for chest tightness, cough, hemoptysis, shortness of breath and wheezing.   Cardiovascular: Negative for chest pain, leg swelling and palpitations.  Gastrointestinal: Positive for abdominal distention and constipation. Negative for abdominal pain, blood in stool, diarrhea, nausea, rectal pain and vomiting.  Genitourinary: Negative.    Musculoskeletal: Negative.   Skin: Negative.   Neurological: Negative.   Hematological: Negative.   Psychiatric/Behavioral: Negative.      PHYSICAL EXAMINATION Blood pressure 139/69, pulse 71, temperature 98.6 F (37 C), temperature source Oral, resp. rate 18, height 5' 10.5" (1.791 m), weight 191 lb 14.4 oz (87 kg), SpO2 100 %.  ECOG PERFORMANCE  STATUS: 1 - Symptomatic but completely ambulatory  Physical Exam  Constitutional: He is oriented to person, place, and time and well-developed, well-nourished, and in no distress. He appears not lethargic, not malnourished and not jaundiced.  He appears not cachectic. No distress.  HENT:  Head: Normocephalic and atraumatic.  Mouth/Throat: Oropharynx is clear and moist and mucous membranes are normal. He has dentures.  Patient has no residual natural teeth with both upper and lower dentures.  Eyes: Pupils are equal, round, and reactive to light. Conjunctivae and EOM are normal. No scleral icterus.  Neck: No JVD present. No thyroid mass and no thyromegaly present.  Cardiovascular: Normal rate, regular rhythm, S1 normal and S2 normal.  Exam reveals gallop.   No murmur heard. Pulmonary/Chest: Effort normal and breath sounds normal. He has no decreased breath sounds. He has no wheezes. He has no rhonchi. He has no rales.  Abdominal:  Abdomen appears to be slightly distended, but soft, nontender and without palpable masses or hepatosplenomegaly  Lymphadenopathy:       Head (right side): No submandibular, no posterior auricular and no occipital adenopathy present.       Head (left side): No submandibular, no posterior auricular and no occipital adenopathy present.    He has no cervical adenopathy.    He has no axillary adenopathy.       Right: No inguinal and no supraclavicular adenopathy present.       Left: No inguinal and no supraclavicular adenopathy present.  Neurological: He is alert and oriented to person, place, and time. He has normal sensation, normal strength, normal reflexes and intact cranial nerves. He appears not lethargic.  Skin: He is not diaphoretic. No pallor.     LABORATORY DATA: I have personally reviewed the data as listed: Appointment on 12/15/2016  Component Date Value Ref Range Status  . WBC 12/15/2016 143.8* 4.0 - 10.3 10e3/uL Final  . NEUT# 12/15/2016 2.4  1.5 - 6.5 10e3/uL Final  . HGB 12/15/2016 13.7  13.0 - 17.1 g/dL Final  . HCT 12/15/2016 43.7  38.4 - 49.9 % Final  . Platelets 12/15/2016 109* 140 - 400 10e3/uL Final  . MCV 12/15/2016 94.2  79.3 - 98.0 fL Final  . MCH 12/15/2016 29.5  27.2 - 33.4  pg Final  . MCHC 12/15/2016 31.4* 32.0 - 36.0 g/dL Final  . RBC 12/15/2016 4.64  4.20 - 5.82 10e6/uL Final  . RDW 12/15/2016 15.0* 11.0 - 14.6 % Final  . lymph# 12/15/2016 140.5* 0.9 - 3.3 10e3/uL Final  . MONO# 12/15/2016 0.2  0.1 - 0.9 10e3/uL Final  . Eosinophils Absolute 12/15/2016 0.2  0.0 - 0.5 10e3/uL Final  . Basophils Absolute 12/15/2016 0.6* 0.0 - 0.1 10e3/uL Final  . NEUT% 12/15/2016 1.7* 39.0 - 75.0 % Final  . LYMPH% 12/15/2016 97.7* 14.0 - 49.0 % Final  . MONO% 12/15/2016 0.1  0.0 - 14.0 % Final  . EOS% 12/15/2016 0.1  0.0 - 7.0 % Final  . BASO% 12/15/2016 0.4  0.0 - 2.0 % Final  . nRBC 12/15/2016 0  0 - 0 % Final  . Retic % 12/15/2016 0.70* 0.80 - 1.80 % Final  . Retic Ct Abs 12/15/2016 32.48* 34.80 - 93.90 10e3/uL Final  . Immature Retic Fract 12/15/2016 2.30* 3.00 - 10.60 % Final  . Polychromasia 12/15/2016 Slight  Slight Final  . Smudge Cells 12/15/2016 Few  Negative Final  . White Cell Comments 12/15/2016 Some lymphs contain cleaved nucleus   Final  . PLT EST  12/15/2016 Decreased  Adequate Final  . LDH 12/15/2016 159  125 - 245 U/L Final  . Sodium 12/15/2016 142  136 - 145 mEq/L Final  . Potassium 12/15/2016 4.4  3.5 - 5.1 mEq/L Final  . Chloride 12/15/2016 110* 98 - 109 mEq/L Final  . CO2 12/15/2016 26  22 - 29 mEq/L Final  . Glucose 12/15/2016 93  70 - 140 mg/dl Final   Glucose reference range is for nonfasting patients. Fasting glucose reference range is 70- 100.  Marland Kitchen BUN 12/15/2016 11.6  7.0 - 26.0 mg/dL Final  . Creatinine 12/15/2016 1.3  0.7 - 1.3 mg/dL Final  . Total Bilirubin 12/15/2016 0.33  0.20 - 1.20 mg/dL Final  . Alkaline Phosphatase 12/15/2016 104  40 - 150 U/L Final  . AST 12/15/2016 18  5 - 34 U/L Final  . ALT 12/15/2016 12  0 - 55 U/L Final  . Total Protein 12/15/2016 6.5  6.4 - 8.3 g/dL Final  . Albumin 12/15/2016 3.8  3.5 - 5.0 g/dL Final  . Calcium 12/15/2016 9.1  8.4 - 10.4 mg/dL Final  . Anion Gap 12/15/2016 6  3 - 11 mEq/L Final  . EGFR  12/15/2016 58* >90 ml/min/1.73 m2 Final   eGFR is calculated using the CKD-EPI Creatinine Equation (2009)  . Uric Acid, Serum 12/15/2016 6.1  2.6 - 7.4 mg/dl Final         Ardath Sax, MD

## 2016-12-15 NOTE — Assessment & Plan Note (Signed)
66 year old male presenting with severe lymphocytic leukocytosis with atypical lymphocytes and smudge cells present on the peripheral blood smear suggestive of presence of chronic lymphocytic leukemia or small lymphocytic lymphoma. At the present time, patient only qualifies for Rai Stage 0 as he does not have significant enough thrombocytopenia, anemia, and no palpable lymphadenopathy. The initial phase of assessment will involve [place] confirming presence of the diagnosis as well as completion staging by obtaining additional imaging.  Once diagnosis is confirmed, I'm likely to recommend treatment for his disease due to current symptoms of severe fatigue as suspected be directly related to presence of the malignancy. The treatment will depend on the cytogenetics and molecular is off the underlying malignancy. Due to presence of thrombocytopenia, I also recommend patient to undergo a bone marrow biopsy  Plan: --Labs today as outlined below --CT N/C/A/P --Consult IR for BM Bx --RTC 1 week after CT scans  Voice recognition software was used and creation of this note. Despite my best effort at editing the text, some misspelling/errors may have occurred.

## 2016-12-15 NOTE — Telephone Encounter (Signed)
FMLA forms left in [employer] dropbox for pt and daughter (caregiver). FMLA questionnaire filled out for both individuals.

## 2016-12-16 LAB — IGG, IGA, IGM
IGA/IMMUNOGLOBULIN A, SERUM: 106 mg/dL (ref 61–437)
IGM (IMMUNOGLOBIN M), SRM: 21 mg/dL (ref 20–172)
IgG, Qn, Serum: 725 mg/dL (ref 700–1600)

## 2016-12-16 LAB — BETA 2 MICROGLOBULIN, SERUM: Beta-2: 2.7 mg/L — ABNORMAL HIGH (ref 0.6–2.4)

## 2016-12-16 LAB — HAPTOGLOBIN: Haptoglobin: 220 mg/dL — ABNORMAL HIGH (ref 34–200)

## 2016-12-17 LAB — FLOW CYTOMETRY

## 2016-12-22 ENCOUNTER — Encounter (HOSPITAL_COMMUNITY): Payer: Self-pay

## 2016-12-22 ENCOUNTER — Ambulatory Visit (HOSPITAL_COMMUNITY)
Admission: RE | Admit: 2016-12-22 | Discharge: 2016-12-22 | Disposition: A | Discharge status: Home / Self Care | Payer: BLUE CROSS/BLUE SHIELD | Source: Ambulatory Visit | Attending: Hematology and Oncology | Admitting: Hematology and Oncology

## 2016-12-22 DIAGNOSIS — M5136 Other intervertebral disc degeneration, lumbar region: Secondary | ICD-10-CM | POA: Diagnosis not present

## 2016-12-22 DIAGNOSIS — R161 Splenomegaly, not elsewhere classified: Secondary | ICD-10-CM | POA: Diagnosis not present

## 2016-12-22 DIAGNOSIS — K5730 Diverticulosis of large intestine without perforation or abscess without bleeding: Secondary | ICD-10-CM | POA: Diagnosis not present

## 2016-12-22 DIAGNOSIS — K769 Liver disease, unspecified: Secondary | ICD-10-CM | POA: Insufficient documentation

## 2016-12-22 DIAGNOSIS — C9190 Lymphoid leukemia, unspecified not having achieved remission: Secondary | ICD-10-CM | POA: Diagnosis not present

## 2016-12-22 DIAGNOSIS — I700 Atherosclerosis of aorta: Secondary | ICD-10-CM | POA: Insufficient documentation

## 2016-12-22 DIAGNOSIS — N400 Enlarged prostate without lower urinary tract symptoms: Secondary | ICD-10-CM | POA: Insufficient documentation

## 2016-12-22 DIAGNOSIS — C9110 Chronic lymphocytic leukemia of B-cell type not having achieved remission: Secondary | ICD-10-CM

## 2016-12-22 DIAGNOSIS — N433 Hydrocele, unspecified: Secondary | ICD-10-CM | POA: Insufficient documentation

## 2016-12-22 DIAGNOSIS — M12852 Other specific arthropathies, not elsewhere classified, left hip: Secondary | ICD-10-CM | POA: Diagnosis not present

## 2016-12-22 MED ORDER — IOPAMIDOL (ISOVUE-300) INJECTION 61%
INTRAVENOUS | Status: AC
  Filled 2016-12-22: qty 100

## 2016-12-22 MED ORDER — IOPAMIDOL (ISOVUE-300) INJECTION 61%
100.0000 mL | Freq: Once | INTRAVENOUS | Status: AC | PRN
  Administered 2016-12-22: 100 mL via INTRAVENOUS

## 2016-12-28 ENCOUNTER — Ambulatory Visit (HOSPITAL_BASED_OUTPATIENT_CLINIC_OR_DEPARTMENT_OTHER): Payer: BLUE CROSS/BLUE SHIELD | Admitting: Hematology and Oncology

## 2016-12-28 ENCOUNTER — Other Ambulatory Visit: Payer: Self-pay | Admitting: Radiology

## 2016-12-28 VITALS — BP 138/83 | HR 72 | Temp 98.7°F | Resp 18 | Ht 70.5 in | Wt 193.5 lb

## 2016-12-28 DIAGNOSIS — C9110 Chronic lymphocytic leukemia of B-cell type not having achieved remission: Secondary | ICD-10-CM | POA: Diagnosis not present

## 2016-12-28 DIAGNOSIS — D696 Thrombocytopenia, unspecified: Secondary | ICD-10-CM | POA: Diagnosis not present

## 2016-12-29 ENCOUNTER — Ambulatory Visit (HOSPITAL_COMMUNITY)
Admission: RE | Admit: 2016-12-29 | Discharge: 2016-12-29 | Disposition: A | Discharge status: Home / Self Care | Payer: BLUE CROSS/BLUE SHIELD | Source: Ambulatory Visit | Attending: Hematology and Oncology | Admitting: Hematology and Oncology

## 2016-12-29 ENCOUNTER — Encounter (HOSPITAL_COMMUNITY): Payer: Self-pay

## 2016-12-29 DIAGNOSIS — C9110 Chronic lymphocytic leukemia of B-cell type not having achieved remission: Secondary | ICD-10-CM | POA: Diagnosis not present

## 2016-12-29 DIAGNOSIS — D649 Anemia, unspecified: Secondary | ICD-10-CM | POA: Insufficient documentation

## 2016-12-29 DIAGNOSIS — Z8052 Family history of malignant neoplasm of bladder: Secondary | ICD-10-CM | POA: Diagnosis not present

## 2016-12-29 DIAGNOSIS — D696 Thrombocytopenia, unspecified: Secondary | ICD-10-CM | POA: Diagnosis not present

## 2016-12-29 DIAGNOSIS — F17210 Nicotine dependence, cigarettes, uncomplicated: Secondary | ICD-10-CM | POA: Insufficient documentation

## 2016-12-29 DIAGNOSIS — Z800 Family history of malignant neoplasm of digestive organs: Secondary | ICD-10-CM | POA: Diagnosis not present

## 2016-12-29 LAB — CBC WITH DIFFERENTIAL/PLATELET
BASOS PCT: 0 %
Basophils Absolute: 0 10*3/uL (ref 0.0–0.1)
EOS ABS: 0 10*3/uL (ref 0.0–0.7)
EOS PCT: 0 %
HCT: 39.8 % (ref 39.0–52.0)
HEMOGLOBIN: 12.5 g/dL — AB (ref 13.0–17.0)
LYMPHS PCT: 97 %
Lymphs Abs: 117.8 10*3/uL — ABNORMAL HIGH (ref 0.7–4.0)
MCH: 28.8 pg (ref 26.0–34.0)
MCHC: 31.4 g/dL (ref 30.0–36.0)
MCV: 91.7 fL (ref 78.0–100.0)
MONOS PCT: 1 %
Monocytes Absolute: 1.2 10*3/uL — ABNORMAL HIGH (ref 0.1–1.0)
NEUTROS PCT: 2 %
Neutro Abs: 2.4 10*3/uL (ref 1.7–7.7)
Platelets: 106 10*3/uL — ABNORMAL LOW (ref 150–400)
RBC: 4.34 MIL/uL (ref 4.22–5.81)
RDW: 15 % (ref 11.5–15.5)
WBC: 121.4 10*3/uL (ref 4.0–10.5)

## 2016-12-29 LAB — PROTIME-INR
INR: 0.98
PROTHROMBIN TIME: 13 s (ref 11.4–15.2)

## 2016-12-29 LAB — FISH,CLL PROGNOSTIC PANEL

## 2016-12-29 MED ORDER — MIDAZOLAM HCL 2 MG/2ML IJ SOLN
INTRAMUSCULAR | Status: AC | PRN
  Administered 2016-12-29 (×2): 1 mg via INTRAVENOUS

## 2016-12-29 MED ORDER — FENTANYL CITRATE (PF) 100 MCG/2ML IJ SOLN
INTRAMUSCULAR | Status: AC | PRN
  Administered 2016-12-29 (×2): 50 ug via INTRAVENOUS

## 2016-12-29 MED ORDER — MIDAZOLAM HCL 2 MG/2ML IJ SOLN
INTRAMUSCULAR | Status: AC
  Filled 2016-12-29: qty 4

## 2016-12-29 MED ORDER — FENTANYL CITRATE (PF) 100 MCG/2ML IJ SOLN
INTRAMUSCULAR | Status: AC
  Filled 2016-12-29: qty 4

## 2016-12-29 MED ORDER — SODIUM CHLORIDE 0.9 % IV SOLN
INTRAVENOUS | Status: DC
  Administered 2016-12-29: 08:00:00 via INTRAVENOUS

## 2016-12-29 NOTE — H&P (Signed)
Chief Complaint: Patient was seen in consultation today for bone marrow biopsy at the request of Perlov,Mikhail G  Referring Physician(s): Orme G  Supervising Physician: Aletta Edouard  Patient Status: [facility] - Out-pt  History of Present Illness: Wesley Bright is a 66 y.o. male being worked up for leukocytosis. He is referred for bone marrow biopsy. PMHx, meds, labs, allergies reviewed. Has been NPO this am Daughter at bedside  Past Medical History:  Diagnosis Date  . External hemorrhoid     Past Surgical History:  Procedure Laterality Date  . APPENDECTOMY    . INGUINAL HERNIA REPAIR     left  . KNEE SURGERY     Left Knee    Allergies: Patient has no known allergies.  Medications: Prior to Admission medications   Medication Sig Start Date End Date Taking? Authorizing Provider  cholecalciferol (VITAMIN D) 1000 units tablet Take 1,000 Units by mouth daily.   Yes [provider]  co-enzyme Q-10 30 MG capsule Take 30 mg by mouth 3 (three) times daily.   Yes [provider]  vitamin B-12 (CYANOCOBALAMIN) 100 MCG tablet Take 100 mcg by mouth daily.   Yes [provider]     Family History  Problem Relation Age of Onset  . Liver cancer Brother   . Cancer Brother        Liver  . Esophageal cancer Father   . Cancer Father        Throat   . Cancer Mother        Bladder - cause of death    Social History   Social History  . Marital status: Married    Spouse name: N/A  . Number of children: 2  . Years of education: N/A   Occupational History  . 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 Smoking status: Current Every Day Smoker    Packs/day: 0.50  . Smokeless tobacco: Never Used  . Alcohol use Yes     Comment: occasional  . Drug use: No  . Sexual activity: Not Asked   Other Topics Concern  . None   Social History Narrative  . None   Review of Systems: A 12 point ROS discussed and pertinent  positives are indicated in the HPI above.  All other systems are negative.  Review of Systems  Vital Signs: There were no vitals taken for this visit.  Physical Exam  Constitutional: He is oriented to person, place, and time. He appears well-developed. No distress.  HENT:  Head: Normocephalic.  Mouth/Throat: Oropharynx is clear and moist.  Neck: Normal range of motion. No tracheal deviation present.  Cardiovascular: Normal rate, regular rhythm and normal heart sounds.   Pulmonary/Chest: Effort normal and breath sounds normal. No respiratory distress.  Neurological: He is alert and oriented to person, place, and time.  Skin: Skin is warm and dry.  Psychiatric: He has a normal mood and affect.    Mallampati Score:  MD Evaluation Airway: WNL Heart: WNL Abdomen: WNL Chest/ Lungs: WNL ASA  Classification: 2 Mallampati/Airway Score: One  Imaging: Dg Chest 2 View  Result Date: 12/07/2016 CLINICAL DATA:  Smoker.  Weight loss. EXAM: CHEST  2 VIEW COMPARISON:  06/06/2007 FINDINGS: Heart size is normal. There are no focal consolidations or pleural effusions. No pulmonary edema. Visualized osseous structures have a normal appearance. IMPRESSION: No evidence for acute cardiopulmonary abnormality. Electronically Signed   By: Nolon Nations M.D.   On: 12/07/2016 13:51  Ct Soft Tissue Neck W Contrast  Result Date: 12/22/2016 CLINICAL DATA:  CLL.  Evaluation for cervical lymphadenopathy. EXAM: CT NECK WITH CONTRAST TECHNIQUE: Multidetector CT imaging of the neck was performed using the standard protocol following the bolus administration of intravenous contrast. CONTRAST:  173m ISOVUE-300 IOPAMIDOL (ISOVUE-300) INJECTION 61% COMPARISON:  None. FINDINGS: Pharynx and larynx: --Nasopharynx: Fossae of Rosenmuller are clear. Normal adenoid tonsils for age. --Oral cavity and oropharynx: The palatine and lingual tonsils are normal. The visible oral cavity and floor of mouth are normal. --Hypopharynx:  Normal vallecula and pyriform sinuses. --Larynx: Normal epiglottis and pre-epiglottic space. Normal aryepiglottic and vocal folds. --Retropharyngeal space: No abscess, effusion or lymphadenopathy. Salivary glands: --Parotid: No mass lesion or inflammation. No sialolithiasis or ductal dilatation. --Submandibular: Symmetric without inflammation. No sialolithiasis or ductal dilatation. --Sublingual: Normal. No ranula or other visible lesion of the base of tongue and floor of mouth. Thyroid: Normal. Lymph nodes: There are multiple bilateral subcentimeter cervical lymph nodes, but none that are of abnormal density or enlarged by CT size criteria. Vascular: Major cervical vessels are patent. Limited intracranial: Normal. Visualized orbits: Normal. Mastoids and visualized paranasal sinuses: No fluid levels or advanced mucosal thickening. No mastoid effusion. Skeleton: No bony spinal canal stenosis. No lytic or blastic lesions. Upper chest: Clear. Other: None. IMPRESSION: No pathologically enlarged or abnormal density cervical lymph nodes. Electronically Signed   By: KUlyses JarredM.D.   On: 12/22/2016 16:54   Ct Chest W Contrast  Result Date: 12/23/2016 CLINICAL DATA:  Chronic lymphocytic leukemia staging workup EXAM: CT CHEST, ABDOMEN, AND PELVIS WITH CONTRAST TECHNIQUE: Multidetector CT imaging of the chest, abdomen and pelvis was performed following the standard protocol during bolus administration of intravenous contrast. CONTRAST:  1085mISOVUE-300 IOPAMIDOL (ISOVUE-300) INJECTION 61% COMPARISON:  Chest radiograph 12/07/2016 FINDINGS: CT CHEST FINDINGS Cardiovascular: Mild atherosclerotic calcification of the aortic arch. Mediastinum/Nodes: Right lower paratracheal lymph node 1.0 cm in short axis on image 19/2. Right hilar lymph node 1.1 cm in short axis on image 23/2. Right infrahilar node 0.8 cm in short axis on image 29/2. Left infrahilar lymph node 1.0 cm in short axis image 26/2. Subcarinal lymph node 1.1 cm  in short axis, image 23/2. Scattered additional but smaller mediastinal, axillary, and subpectoral lymph nodes are present Lungs/Pleura: Linear subsegmental atelectasis or scarring in the lower lingula. No significant pulmonary nodule identified. Musculoskeletal: Mild thoracic spondylosis. CT ABDOMEN PELVIS FINDINGS Hepatobiliary: A total of 8 small hypodense liver lesions are present. One of the larger lesions is in the dome of segment 8 measures 0.9 by 0.6 cm. The larger lesions seen to have fluid density but the smaller lesions are technically nonspecific due to small size. Contracted gallbladder.  No biliary dilatation. Pancreas: Unremarkable Spleen: Splenomegaly, spleen measures 13.8 by 7.7 by 22.0 cm (volume = 1200 cm^3). Mild diffuse splenic heterogeneity. Adrenals/Urinary Tract: 5 mm hypodense lesion of the right kidney lower pole, likely a cyst but technically nonspecific due to small size. Adrenal glands normal. Stomach/Bowel: Sigmoid colon diverticulosis without active diverticulitis. Vascular/Lymphatic: Celiac chain lymph node 1.0 cm in short axis on image 58/ 2. Left periaortic node 1.1 cm in short axis on image 64/2. Right external iliac node 1.2 cm in short axis on image 114/2. Left external iliac node 1.2 cm in short axis on image 115/2. Left common iliac node 0.9 cm in short axis on image 98/2. Additional smaller retroperitoneal and mesenteric lymph nodes are present. Aortoiliac atherosclerotic vascular disease. Mildly irregular mural thrombus in the left  common iliac artery. Reproductive: Enlarged prostate gland indents the bladder base and measures 5.7 by 4.1 by 5.8 cm (volume = 71 cm^3). Left scrotal hydrocele is partially included on today's imaging. Other: No supplemental non-categorized findings. Musculoskeletal: Synovial herniation pit in the left proximal femur. Asymmetric degenerative arthropathy of the left hip. New lower lumbar degenerative disc disease and facet arthropathy with  suspected impingement at L3-4, L4-5, and L5-S1. IMPRESSION: 1. Notable splenomegaly (splenic volume 1200 cc) with mild paratracheal, right hilar, left infrahilar, retroperitoneal, external iliac, and celiac chain nodal enlargement. Appearance compatible with chronic lymphocytic leukemia. 2. Prostatomegaly.  Small left scrotal hydrocele. 3. Lumbar spondylosis and degenerative disc disease causing multilevel lower lumbar impingement. 4. There are 8 small hypodense lesions in the liver which technically nonspecific, although statistically likely to represent cysts. If the patient has abnormal liver enzymes or if otherwise clinically warranted, hepatic protocol MRI with and without contrast could be utilized for further characterization. 5.  Aortic Atherosclerosis (ICD10-I70.0). 6. Sigmoid colon diverticulosis. 7. Asymmetric arthropathy in the left hip. Electronically Signed   By: Van Clines M.D.   On: 12/23/2016 08:30   Ct Abdomen Pelvis W Contrast  Result Date: 12/23/2016 CLINICAL DATA:  Chronic lymphocytic leukemia staging workup EXAM: CT CHEST, ABDOMEN, AND PELVIS WITH CONTRAST TECHNIQUE: Multidetector CT imaging of the chest, abdomen and pelvis was performed following the standard protocol during bolus administration of intravenous contrast. CONTRAST:  16m ISOVUE-300 IOPAMIDOL (ISOVUE-300) INJECTION 61% COMPARISON:  Chest radiograph 12/07/2016 FINDINGS: CT CHEST FINDINGS Cardiovascular: Mild atherosclerotic calcification of the aortic arch. Mediastinum/Nodes: Right lower paratracheal lymph node 1.0 cm in short axis on image 19/2. Right hilar lymph node 1.1 cm in short axis on image 23/2. Right infrahilar node 0.8 cm in short axis on image 29/2. Left infrahilar lymph node 1.0 cm in short axis image 26/2. Subcarinal lymph node 1.1 cm in short axis, image 23/2. Scattered additional but smaller mediastinal, axillary, and subpectoral lymph nodes are present Lungs/Pleura: Linear subsegmental atelectasis or  scarring in the lower lingula. No significant pulmonary nodule identified. Musculoskeletal: Mild thoracic spondylosis. CT ABDOMEN PELVIS FINDINGS Hepatobiliary: A total of 8 small hypodense liver lesions are present. One of the larger lesions is in the dome of segment 8 measures 0.9 by 0.6 cm. The larger lesions seen to have fluid density but the smaller lesions are technically nonspecific due to small size. Contracted gallbladder.  No biliary dilatation. Pancreas: Unremarkable Spleen: Splenomegaly, spleen measures 13.8 by 7.7 by 22.0 cm (volume = 1200 cm^3). Mild diffuse splenic heterogeneity. Adrenals/Urinary Tract: 5 mm hypodense lesion of the right kidney lower pole, likely a cyst but technically nonspecific due to small size. Adrenal glands normal. Stomach/Bowel: Sigmoid colon diverticulosis without active diverticulitis. Vascular/Lymphatic: Celiac chain lymph node 1.0 cm in short axis on image 58/ 2. Left periaortic node 1.1 cm in short axis on image 64/2. Right external iliac node 1.2 cm in short axis on image 114/2. Left external iliac node 1.2 cm in short axis on image 115/2. Left common iliac node 0.9 cm in short axis on image 98/2. Additional smaller retroperitoneal and mesenteric lymph nodes are present. Aortoiliac atherosclerotic vascular disease. Mildly irregular mural thrombus in the left common iliac artery. Reproductive: Enlarged prostate gland indents the bladder base and measures 5.7 by 4.1 by 5.8 cm (volume = 71 cm^3). Left scrotal hydrocele is partially included on today's imaging. Other: No supplemental non-categorized findings. Musculoskeletal: Synovial herniation pit in the left proximal femur. Asymmetric degenerative arthropathy of the left hip.  New lower lumbar degenerative disc disease and facet arthropathy with suspected impingement at L3-4, L4-5, and L5-S1. IMPRESSION: 1. Notable splenomegaly (splenic volume 1200 cc) with mild paratracheal, right hilar, left infrahilar, retroperitoneal,  external iliac, and celiac chain nodal enlargement. Appearance compatible with chronic lymphocytic leukemia. 2. Prostatomegaly.  Small left scrotal hydrocele. 3. Lumbar spondylosis and degenerative disc disease causing multilevel lower lumbar impingement. 4. There are 8 small hypodense lesions in the liver which technically nonspecific, although statistically likely to represent cysts. If the patient has abnormal liver enzymes or if otherwise clinically warranted, hepatic protocol MRI with and without contrast could be utilized for further characterization. 5.  Aortic Atherosclerosis (ICD10-I70.0). 6. Sigmoid colon diverticulosis. 7. Asymmetric arthropathy in the left hip. Electronically Signed   By: Van Clines M.D.   On: 12/23/2016 08:30    Labs:  CBC:  Recent Labs  12/07/16 1406 12/07/16 1444 12/15/16 1113 12/29/16 0720  WBC 142.3* 146.5* 143.8* 121.4*  HGB 14.0* 13.3 13.7 12.5*  HCT 343.5* 42.6 43.7 39.8  PLT  --  123* 109* 106*    COAGS:  Recent Labs  12/29/16 0720  INR 0.98    BMP:  Recent Labs  12/07/16 1345 12/15/16 1113  NA 145* 142  K 4.7 4.4  CL 108*  --   CO2 22 26  GLUCOSE 91 93  BUN 11 11.6  CALCIUM 8.8 9.1  CREATININE 1.30* 1.3  GFRNONAA 58*  --   GFRAA 67  --     LIVER FUNCTION TESTS:  Recent Labs  12/07/16 1345 12/15/16 1113  BILITOT 0.3 0.33  AST 17 18  ALT 14 12  ALKPHOS 107 104  PROT 6.3 6.5  ALBUMIN 4.3 3.8    TUMOR MARKERS: No results for input(s): AFPTM, CEA, CA199, CHROMGRNA in the last 8760 hours.  Assessment and Plan: Leukocytosis For CT guided bone marrow biopsy Labs reviewed. Risks and benefits discussed with the patient including, but not limited to bleeding, infection, damage to adjacent structures or low yield requiring additional tests. All of the patient's questions were answered, patient is agreeable to proceed. Consent signed and in chart.    Thank you for this interesting consult.  I greatly enjoyed  meeting Wesley Bright and look forward to participating in their care.  A copy of this report was sent to the requesting provider on this date.  Electronically Signed: Ascencion Dike, PA-C 12/29/2016, 8:31 AM   I spent a total of 20 minutes in face to face in clinical consultation, greater than 50% of which was counseling/coordinating care for bone marrow biopsy

## 2016-12-29 NOTE — Procedures (Signed)
Interventional Radiology Procedure Note  Procedure: CT guided aspirate and core biopsy of right iliac bone Complications: None Recommendations: - Bedrest supine x 1 hrs - Follow biopsy results  Kimanh Templeman T. Cledith Abdou, M.D Pager:  [phone]   

## 2016-12-29 NOTE — Discharge Instructions (Signed)
Moderate Conscious Sedation, Adult, Care After °These instructions provide you with information about caring for yourself after your procedure. Your health care provider may also give you more specific instructions. Your treatment has been planned according to current medical practices, but problems sometimes occur. Call your health care provider if you have any problems or questions after your procedure. °What can I expect after the procedure? °After your procedure, it is common: °· To feel sleepy for several hours. °· To feel clumsy and have poor balance for several hours. °· To have poor judgment for several hours. °· To vomit if you eat too soon. ° °Follow these instructions at home: °For at least 24 hours after the procedure: ° °· Do not: °? Participate in activities where you could fall or become injured. °? Drive. °? Use heavy machinery. °? Drink alcohol. °? Take sleeping pills or medicines that cause drowsiness. °? Make important decisions or sign legal documents. °? Take care of children on your own. °· Rest. °Eating and drinking °· Follow the diet recommended by your health care provider. °· If you vomit: °? Drink water, juice, or soup when you can drink without vomiting. °? Make sure you have little or no nausea before eating solid foods. °General instructions °· Have a responsible adult stay with you until you are awake and alert. °· Take over-the-counter and prescription medicines only as told by your health care provider. °· If you smoke, do not smoke without supervision. °· Keep all follow-up visits as told by your health care provider. This is important. °Contact a health care provider if: °· You keep feeling nauseous or you keep vomiting. °· You feel light-headed. °· You develop a rash. °· You have a fever. °Get help right away if: °· You have trouble breathing. °This information is not intended to replace advice given to you by your health care provider. Make sure you discuss any questions you have  with your health care provider. °Document Released: 02/22/2013 Document Revised: 10/07/2015 Document Reviewed: 08/24/2015 °Elsevier Interactive Patient Education © 2018 [employer] °Bone Marrow Aspiration and Bone Marrow Biopsy, Adult, Care After °This sheet gives you information about how to care for yourself after your procedure. Your health care provider may also give you more specific instructions. If you have problems or questions, contact your health care provider. °What can I expect after the procedure? °After the procedure, it is common to have: °· Mild pain and tenderness. °· Swelling. °· Bruising. ° °Follow these instructions at home: °· Take over-the-counter or prescription medicines only as told by your health care provider. °· Do not take baths, swim, or use a hot tub until your health care provider approves. Ask if you can take a shower or have a sponge bath. °· Follow instructions from your health care provider about how to take care of the puncture site. Make sure you: °? Wash your hands with soap and water before you change your bandage (dressing). If soap and water are not available, use hand sanitizer. °? Change your dressing as told by your health care provider. °· Check your puncture site every day for signs of infection. Check for: °? More redness, swelling, or pain. °? More fluid or blood. °? Warmth. °? Pus or a bad smell. °· Return to your normal activities as told by your health care provider. Ask your health care provider what activities are safe for you. °· Do not drive for 24 hours if you were given a medicine to help you relax (sedative). °·   Keep all follow-up visits as told by your health care provider. This is important. °Contact a health care provider if: °· You have more redness, swelling, or pain around the puncture site. °· You have more fluid or blood coming from the puncture site. °· Your puncture site feels warm to the touch. °· You have pus or a bad smell coming from the  puncture site. °· You have a fever. °· Your pain is not controlled with medicine. °This information is not intended to replace advice given to you by your health care provider. Make sure you discuss any questions you have with your health care provider. °Document Released: 11/21/2004 Document Revised: 11/22/2015 Document Reviewed: 10/16/2015 °Elsevier Interactive Patient Education © 2018 [employer] ° °

## 2016-12-29 NOTE — Progress Notes (Signed)
CRITICAL VALUE ALERT  Critical Value:  121.4 WBC  Date & Time Notied:  0828 12/29/16  Provider Notified: Ascencion Dike, PA  Orders Received/Actions taken: PA had already seen result, result was expected, pt here for BMB for high WBC.  No further orders at this time.

## 2016-12-30 NOTE — Assessment & Plan Note (Signed)
66 year old male with confirmed diagnosis of chronic lymphocytic leukemia. Since the last visit in the clinic, patient underwent staging and prognostic assessment. In presence of lymphadenopathy and splenomegaly in addition to the lymphocytosis, patient is staged at [place] [facility]. Patient does have a mild thrombocytopenia, but that does not meet the diagnostic threshold to be considered stage IV. Prognostic evaluation demonstrates presence of isolated deletion 13q which places patient in good prognostic category along side negative testing for CD38.  Normally, I would not recommend treating the stage of disease, but patient has expressed significant concern about the degree of fatigue he has been experiencing. With that in mind, considering his excellent baseline performance status, I would offer patient a treatment with FCR combination of chemotherapy containing fludarabine, cyclophosphamide, and rituximab. After discussing the management options, which also include bendamustine and rituximab and approved them and decided against proceeding with any treatment at this point in time.   Plan: --No therapy at this time based on patient's preference --Return to clinic in 3 months with labs to monitor for disease progression.  Voice recognition software was used and creation of this note. Despite my best effort at editing the text, some misspelling/errors may have occurred.

## 2016-12-30 NOTE — Progress Notes (Signed)
[facility] Follow-up Visit:  Assessment: CLL (chronic lymphocytic leukemia) ([place]) 66 year old male with confirmed diagnosis of chronic lymphocytic leukemia. Since the last visit in the clinic, patient underwent staging and prognostic assessment. In presence of lymphadenopathy and splenomegaly in addition to the lymphocytosis, patient is staged at [place] II. Patient does have a mild thrombocytopenia, but that does not meet the diagnostic threshold to be considered stage IV. Prognostic evaluation demonstrates presence of isolated deletion 13q which places patient in good prognostic category along side negative testing for CD38.  Normally, I would not recommend treating the stage of disease, but patient has expressed significant concern about the degree of fatigue he has been experiencing. With that in mind, considering his excellent baseline performance status, I would offer patient a treatment with FCR combination of chemotherapy containing fludarabine, cyclophosphamide, and rituximab. After discussing the management options, which also include bendamustine and rituximab and approved them and decided against proceeding with any treatment at this point in time.   Plan: --No therapy at this time based on patient's preference --Return to clinic in 3 months with labs to monitor for disease progression.  Voice recognition software was used and creation of this note. Despite my best effort at editing the text, some misspelling/errors may have occurred.   Orders Placed This Encounter  Procedures  . CBC with Differential    Standing Status:   Future    Standing Expiration Date:   12/28/2017  . Comprehensive metabolic panel    Standing Status:   Future    Standing Expiration Date:   12/28/2017  . Lactate dehydrogenase (LDH)    Standing Status:   Future    Standing Expiration Date:   12/28/2017  . Uric acid    Standing Status:   Future    Standing Expiration Date:   12/28/2017  .  QIG  (Quant. immunoglobulins  - IgG, IgA, IgM)    Standing Status:   Future    Standing Expiration Date:   12/28/2017    Cancer Staging CLL (chronic lymphocytic leukemia) ([place]) Staging form: Chronic Lymphocytic Leukemia / Small Lymphocytic Lymphoma, AJCC 8th Edition - Clinical stage from 12/30/2016: Modified Rai Stage II (Modified Rai risk: Intermediate, Lymphocytosis: Present, Adenopathy: Present, Organomegaly: Present, Anemia: Absent, Thrombocytopenia: Absent) - Signed by Ardath Sax, MD on 12/30/2016   All questions were answered. . The patient knows to call the clinic with any problems, questions or concerns.  This note was electronically signed.    History of Presenting Illness Wesley Bright is a 66 y.o. male followed in the [facility] for Diagnosis of chronic lymphocytic leukemia, Rai II, del13q-positive. Patient initially presented to his primary care provider with complaints of progressive fatigue, excessive tiredness, decreasing ability to participate in the work and daily activities for the past 4-5 months. Patient also has had a weight loss of about 15 pounds over the same. Patient underwent evaluation which she presents to the clinic to discuss today.  Patient denies any new complaints since the last visit to the clinic other than persistent fatigue.  Oncological/hematological History: --Labs, 12/15/16: WBC 144, ANC 2.4, ALC 141, Mono 0.2, Baso 0.6, Hgb 13.7, Plt 109; Peripheral blood smear positive for atypical lymphocytes and smudge cells; LDH 159, beta-2 microglobulin 2.7, Haptoglobin 220, IgG 725.    Oncological/hematological History:   CLL (chronic lymphocytic leukemia) ([place])   12/15/2016 Pathology Results    pBlood Flow/CLL FISH: Peripheral blood flow cytometry positive for presence of monoclonal B cells. IHC -- positive  for CD5, CD19, CD20, CD21, CD22, CD23, HLA-DR, and lambda light-chains. FISH -- positive for heterozygous del13q, negative for del 17p, del 11q,  and tr 12.      12/22/2016 Imaging    CT N/C/A/P: Diffuse non-bulky lymphadenopathy in the chest, abdomen, and pelvis and no significant lymphadenopathy in the neck. Splenomegaly with spleen measuring 13.8 cm.      12/28/2016 Initial Diagnosis    CLL (chronic lymphocytic leukemia) ([facility])       Medical History: Past Medical History:  Diagnosis Date  . External hemorrhoid     Surgical History: Past Surgical History:  Procedure Laterality Date  . APPENDECTOMY    . INGUINAL HERNIA REPAIR     left  . KNEE SURGERY     Left Knee    Family History: Family History  Problem Relation Age of Onset  . Liver cancer Brother   . Cancer Brother        Liver  . Esophageal cancer Father   . Cancer Father        Throat   . Cancer Mother        Bladder - cause of death    Social History: Social History   Social History  . Marital status: Married    Spouse name: N/A  . Number of children: 2  . Years of education: N/A   Occupational History  . 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 Smoking status: Current Every Day Smoker    Packs/day: 0.50  . Smokeless tobacco: Never Used  . Alcohol use Yes     Comment: occasional  . Drug use: No  . Sexual activity: Not on file   Other Topics Concern  . Not on file   Social History Narrative  . No narrative on file    Allergies: No Known Allergies  Medications:  Current Outpatient Prescriptions  Medication Sig Dispense Refill  . cholecalciferol (VITAMIN D) 1000 units tablet Take 1,000 Units by mouth daily.    Marland Kitchen co-enzyme Q-10 30 MG capsule Take 30 mg by mouth 3 (three) times daily.    . vitamin B-12 (CYANOCOBALAMIN) 100 MCG tablet Take 100 mcg by mouth daily.     No current facility-administered medications for this visit.     Review of Systems: Review of Systems  Constitutional: Positive for appetite change, fatigue and unexpected weight change. Negative for diaphoresis and fever.  HENT:    Negative for hearing loss, lump/mass, mouth sores, nosebleeds, sore throat, tinnitus, trouble swallowing and voice change.   Eyes: Negative for eye problems and icterus.  Respiratory: Negative for chest tightness, cough, hemoptysis, shortness of breath and wheezing.   Cardiovascular: Negative for chest pain, leg swelling and palpitations.  Gastrointestinal: Positive for abdominal distention and constipation. Negative for abdominal pain, blood in stool, diarrhea, nausea, rectal pain and vomiting.  Genitourinary: Negative.    Musculoskeletal: Negative.   Skin: Negative.   Neurological: Negative.   Hematological: Negative.   Psychiatric/Behavioral: Negative.      PHYSICAL EXAMINATION Blood pressure 138/83, pulse 72, temperature 98.7 F (37.1 C), temperature source Oral, resp. rate 18, height 5' 10.5" (1.791 m), weight 193 lb 8 oz (87.8 kg), SpO2 98 %.  ECOG PERFORMANCE STATUS: 1 - Symptomatic but completely ambulatory  Physical Exam  Constitutional: He is oriented to person, place, and time and well-developed, well-nourished, and in no distress. He appears not lethargic, not malnourished and not jaundiced. He appears not cachectic. No distress.  HENT:  Head: Normocephalic and atraumatic.  Mouth/Throat: Oropharynx is clear and moist and mucous membranes are normal. He has dentures.  Patient has no residual natural teeth with both upper and lower dentures.  Eyes: Pupils are equal, round, and reactive to light. Conjunctivae and EOM are normal. No scleral icterus.  Neck: No JVD present. No thyroid mass and no thyromegaly present.  Cardiovascular: Normal rate, regular rhythm, S1 normal and S2 normal.  Exam reveals gallop.   No murmur heard. Pulmonary/Chest: Effort normal and breath sounds normal. He has no decreased breath sounds. He has no wheezes. He has no rhonchi. He has no rales.  Abdominal:  Abdomen appears to be slightly distended, but soft, nontender and without palpable masses or  hepatosplenomegaly  Lymphadenopathy:       Head (right side): No submandibular, no posterior auricular and no occipital adenopathy present.       Head (left side): No submandibular, no posterior auricular and no occipital adenopathy present.    He has no cervical adenopathy.    He has no axillary adenopathy.       Right: No inguinal and no supraclavicular adenopathy present.       Left: No inguinal and no supraclavicular adenopathy present.  Neurological: He is alert and oriented to person, place, and time. He has normal sensation, normal strength, normal reflexes and intact cranial nerves. He appears not lethargic.  Skin: He is not diaphoretic. No pallor.     LABORATORY DATA: I have personally reviewed the data as listed: No visits with results within 1 Week(s) from this visit.  Latest known visit with results is:  Clinical Support on 12/15/2016  Component Date Value Ref Range Status  . WBC 12/15/2016 143.8* 4.0 - 10.3 10e3/uL Final  . NEUT# 12/15/2016 2.4  1.5 - 6.5 10e3/uL Final  . HGB 12/15/2016 13.7  13.0 - 17.1 g/dL Final  . HCT 12/15/2016 43.7  38.4 - 49.9 % Final  . Platelets 12/15/2016 109* 140 - 400 10e3/uL Final  . MCV 12/15/2016 94.2  79.3 - 98.0 fL Final  . MCH 12/15/2016 29.5  27.2 - 33.4 pg Final  . MCHC 12/15/2016 31.4* 32.0 - 36.0 g/dL Final  . RBC 12/15/2016 4.64  4.20 - 5.82 10e6/uL Final  . RDW 12/15/2016 15.0* 11.0 - 14.6 % Final  . lymph# 12/15/2016 140.5* 0.9 - 3.3 10e3/uL Final  . MONO# 12/15/2016 0.2  0.1 - 0.9 10e3/uL Final  . Eosinophils Absolute 12/15/2016 0.2  0.0 - 0.5 10e3/uL Final  . Basophils Absolute 12/15/2016 0.6* 0.0 - 0.1 10e3/uL Final  . NEUT% 12/15/2016 1.7* 39.0 - 75.0 % Final  . LYMPH% 12/15/2016 97.7* 14.0 - 49.0 % Final  . MONO% 12/15/2016 0.1  0.0 - 14.0 % Final  . EOS% 12/15/2016 0.1  0.0 - 7.0 % Final  . BASO% 12/15/2016 0.4  0.0 - 2.0 % Final  . nRBC 12/15/2016 0  0 - 0 % Final  . Retic % 12/15/2016 0.70* 0.80 - 1.80 % Final  .  Retic Ct Abs 12/15/2016 32.48* 34.80 - 93.90 10e3/uL Final  . Immature Retic Fract 12/15/2016 2.30* 3.00 - 10.60 % Final  . Polychromasia 12/15/2016 Slight  Slight Final  . Smudge Cells 12/15/2016 Few  Negative Final  . White Cell Comments 12/15/2016 Some lymphs contain cleaved nucleus   Final  . PLT EST 12/15/2016 Decreased  Adequate Final  . LDH 12/15/2016 159  125 - 245 U/L Final  . Sodium 12/15/2016 142  136 - 145 mEq/L  Final  . Potassium 12/15/2016 4.4  3.5 - 5.1 mEq/L Final  . Chloride 12/15/2016 110* 98 - 109 mEq/L Final  . CO2 12/15/2016 26  22 - 29 mEq/L Final  . Glucose 12/15/2016 93  70 - 140 mg/dl Final   Glucose reference range is for nonfasting patients. Fasting glucose reference range is 70- 100.  Marland Kitchen BUN 12/15/2016 11.6  7.0 - 26.0 mg/dL Final  . Creatinine 12/15/2016 1.3  0.7 - 1.3 mg/dL Final  . Total Bilirubin 12/15/2016 0.33  0.20 - 1.20 mg/dL Final  . Alkaline Phosphatase 12/15/2016 104  40 - 150 U/L Final  . AST 12/15/2016 18  5 - 34 U/L Final  . ALT 12/15/2016 12  0 - 55 U/L Final  . Total Protein 12/15/2016 6.5  6.4 - 8.3 g/dL Final  . Albumin 12/15/2016 3.8  3.5 - 5.0 g/dL Final  . Calcium 12/15/2016 9.1  8.4 - 10.4 mg/dL Final  . Anion Gap 12/15/2016 6  3 - 11 mEq/L Final  . EGFR 12/15/2016 58* >90 ml/min/1.73 m2 Final   eGFR is calculated using the CKD-EPI Creatinine Equation (2009)  . Uric Acid, Serum 12/15/2016 6.1  2.6 - 7.4 mg/dl Final  . Fish, CLL 12/15/2016 See report in EPIC.   Final  . Flow Cytometry 12/15/2016 See Separate Report   Final  . Haptoglobin 12/15/2016 220* 34 - 200 mg/dL Final  . IgG, Qn, Serum 12/15/2016 725  700 - 1,600 mg/dL Final  . IgA, Qn, Serum 12/15/2016 106  61 - 437 mg/dL Final  . IgM, Qn, Serum 12/15/2016 21  20 - 172 mg/dL Final   Result confirmed on concentration.  . Beta-2 12/15/2016 2.7* 0.6 - 2.4 mg/L Final   Siemens Immulite 2000 Immunochemiluminometric assay (ICMA)       Ardath Sax, MD

## 2017-01-08 ENCOUNTER — Encounter: Payer: Self-pay | Admitting: Hematology and Oncology

## 2017-01-25 ENCOUNTER — Encounter: Payer: Self-pay | Admitting: Hematology and Oncology

## 2017-01-25 ENCOUNTER — Telehealth: Payer: Self-pay | Admitting: Pharmacist

## 2017-01-25 DIAGNOSIS — C9110 Chronic lymphocytic leukemia of B-cell type not having achieved remission: Secondary | ICD-10-CM

## 2017-01-25 MED ORDER — IBRUTINIB 420 MG PO TABS: 420.0000 mg | ORAL_TABLET | Freq: Every day | ORAL | 0 refills | Status: DC

## 2017-01-25 NOTE — Telephone Encounter (Signed)
Oral Oncology Pharmacist Encounter  Received notification from MD that patient is considering treatment options for chronic lymphocytic leukemia and would like initial counseling for ibrutinib for the treatment of CLL until disease progression or unacceptable toxicity.  Recent labs from Epic assessed, no organ dysfunction noted. No EKG in Epic, however, no evidence of cardiac dysfunction. BPs in Epic reviewed,OK for treatment, will continue to be monitored.  Current medication list in Epic reviewed, no DDIs with ibrutinib identified.   I spoke with patient and daughter for overview of new oral chemotherapy medication: ibrutinib.   Counseled patient on administration, dosing, side effects, safe handling, and monitoring. Patient will take ibrutinib 420mg  tablet, 1 tablet by mouth once daily with water, without regard to food. Patient knows to avoid grapefruit or grapefruit juice.  Side effects include but not limited to: rash, decreased blood counts, GI upset, increased bruising or bleeding, fatigue, hypertension, and altered cardiac conduction.    Reviewed with patient importance of keeping a medication schedule and plan for any missed doses.  The Bior's voiced understanding and appreciation.   All questions answered.  They would like to be seen back in the office ASAP to discuss treatment plan with Dr. Lebron Conners. Collaborative practice RN has been alerted to reach out to scheduling to get patient back in office prior to November appointment already scheduled.  Patient knows to call the office with questions or concerns. Oral Oncology Clinic will continue to follow.  Thank you,  Johny Drilling, PharmD, BCPS, BCOP 01/25/2017  4:34 PM Oral Oncology Clinic [phone]

## 2017-01-27 ENCOUNTER — Ambulatory Visit (HOSPITAL_BASED_OUTPATIENT_CLINIC_OR_DEPARTMENT_OTHER): Payer: BLUE CROSS/BLUE SHIELD | Admitting: Hematology and Oncology

## 2017-01-27 ENCOUNTER — Telehealth: Payer: Self-pay | Admitting: Hematology and Oncology

## 2017-01-27 VITALS — BP 128/98 | HR 64 | Temp 99.1°F | Resp 16 | Ht 70.0 in | Wt 193.7 lb

## 2017-01-27 DIAGNOSIS — Z5111 Encounter for antineoplastic chemotherapy: Secondary | ICD-10-CM

## 2017-01-27 DIAGNOSIS — C9110 Chronic lymphocytic leukemia of B-cell type not having achieved remission: Secondary | ICD-10-CM | POA: Diagnosis not present

## 2017-01-27 MED ORDER — IBRUTINIB 420 MG PO TABS: 420.0000 mg | ORAL_TABLET | Freq: Every day | ORAL | 0 refills | Status: DC

## 2017-01-27 MED ORDER — ALLOPURINOL 300 MG PO TABS: 300.0000 mg | ORAL_TABLET | Freq: Every day | ORAL | 3 refills | Status: DC

## 2017-01-27 NOTE — Telephone Encounter (Signed)
Scheduled appt per 9/12 los - Gave patient AVS and calender per los.  

## 2017-01-28 ENCOUNTER — Other Ambulatory Visit (HOSPITAL_BASED_OUTPATIENT_CLINIC_OR_DEPARTMENT_OTHER): Payer: BLUE CROSS/BLUE SHIELD

## 2017-01-28 ENCOUNTER — Other Ambulatory Visit: Payer: Self-pay | Admitting: Pharmacist

## 2017-01-28 DIAGNOSIS — C9110 Chronic lymphocytic leukemia of B-cell type not having achieved remission: Secondary | ICD-10-CM

## 2017-01-28 DIAGNOSIS — D696 Thrombocytopenia, unspecified: Secondary | ICD-10-CM | POA: Diagnosis not present

## 2017-01-28 LAB — CBC & DIFF AND RETIC
BASO%: 0.3 % (ref 0.0–2.0)
BASOS ABS: 0.4 10*3/uL — AB (ref 0.0–0.1)
EOS%: 0.1 % (ref 0.0–7.0)
Eosinophils Absolute: 0.2 10*3/uL (ref 0.0–0.5)
HEMATOCRIT: 41.1 % (ref 38.4–49.9)
HEMOGLOBIN: 13.1 g/dL (ref 13.0–17.1)
Immature Retic Fract: 5.2 % (ref 3.00–10.60)
LYMPH%: 96.4 % — ABNORMAL HIGH (ref 14.0–49.0)
MCH: 29.8 pg (ref 27.2–33.4)
MCHC: 31.9 g/dL — ABNORMAL LOW (ref 32.0–36.0)
MCV: 93.6 fL (ref 79.3–98.0)
MONO#: 2.2 10*3/uL — ABNORMAL HIGH (ref 0.1–0.9)
MONO%: 1.6 % (ref 0.0–14.0)
NEUT#: 2.1 10*3/uL (ref 1.5–6.5)
NEUT%: 1.6 % — ABNORMAL LOW (ref 39.0–75.0)
PLATELETS: 104 10*3/uL — AB (ref 140–400)
RBC: 4.39 10*6/uL (ref 4.20–5.82)
RDW: 15.1 % — AB (ref 11.0–14.6)
Retic %: 0.86 % (ref 0.80–1.80)
Retic Ct Abs: 37.75 10*3/uL (ref 34.80–93.90)
WBC: 137.5 10*3/uL (ref 4.0–10.3)
lymph#: 132.5 10*3/uL — ABNORMAL HIGH (ref 0.9–3.3)
nRBC: 0 % (ref 0–0)

## 2017-01-28 LAB — COMPREHENSIVE METABOLIC PANEL
ALBUMIN: 3.8 g/dL (ref 3.5–5.0)
ALK PHOS: 99 U/L (ref 40–150)
ALT: 7 U/L (ref 0–55)
ANION GAP: 7 meq/L (ref 3–11)
AST: 15 U/L (ref 5–34)
BILIRUBIN TOTAL: 0.36 mg/dL (ref 0.20–1.20)
BUN: 15.7 mg/dL (ref 7.0–26.0)
CO2: 24 meq/L (ref 22–29)
CREATININE: 1.2 mg/dL (ref 0.7–1.3)
Calcium: 8.9 mg/dL (ref 8.4–10.4)
Chloride: 111 mEq/L — ABNORMAL HIGH (ref 98–109)
EGFR: 62 mL/min/{1.73_m2} — AB (ref >90)
Glucose: 87 mg/dl (ref 70–140)
Potassium: 4.2 mEq/L (ref 3.5–5.1)
Sodium: 142 mEq/L (ref 136–145)
Total Protein: 6.5 g/dL (ref 6.4–8.3)

## 2017-01-28 LAB — LACTATE DEHYDROGENASE: LDH: 157 U/L (ref 125–245)

## 2017-01-28 LAB — URIC ACID: URIC ACID, SERUM: 6.4 mg/dL (ref 2.6–7.4)

## 2017-01-28 LAB — MAGNESIUM: MAGNESIUM: 2.3 mg/dL (ref 1.5–2.5)

## 2017-01-28 LAB — TECHNOLOGIST REVIEW

## 2017-01-28 MED ORDER — IBRUTINIB 420 MG PO TABS: 420.0000 mg | ORAL_TABLET | Freq: Every day | ORAL | 0 refills | Status: DC

## 2017-01-28 NOTE — Telephone Encounter (Signed)
Oral Chemotherapy Pharmacist Encounter  Received new prescription for ibrutinib for the treatment of CLL until disease progression or unacceptable toxicity as patient has decided to proceed with this treatment option.  Labs from 01/28/17 reviewed, OK for treatment. No DDIs identified.  The prescription has been e-scribed to the [facility] for benefit analysis and approval.   Will follow up with patient regarding insurance, pharmacy, and start date.   Oral Oncology Clinic will continue to follow.  Thank you,  Johny Drilling, PharmD, BCPS, BCOP 01/28/2017  4:56 PM Oral Oncology Clinic [phone]

## 2017-01-29 ENCOUNTER — Telehealth: Payer: Self-pay | Admitting: Pharmacy Technician

## 2017-01-29 LAB — PHOSPHORUS: Phosphorus, Ser: 2.3 mg/dL — ABNORMAL LOW (ref 2.5–4.5)

## 2017-01-29 MED FILL — IMBRUVICA 420 MG TAB: 420 | 28 days supply | Qty: 28 | Fill #0

## 2017-01-29 NOTE — Telephone Encounter (Signed)
Oral Oncology Patient Advocate Encounter  Received notification from [place] that prior authorization for Imbruvica is required.  PA submitted on CoverMyMeds Key [id number] Status is pending  Oral Oncology Clinic will continue to follow.  [name]. [name], [place] Patient [place] [phone] 01/29/2017 9:35 AM

## 2017-01-29 NOTE — Telephone Encounter (Signed)
Oral Chemotherapy Patient Advocate Encounter  Spoke with patient's daughter, Delcie Roch.  Copayment for the Imbruvica is $0.  The patient will pick up at the [facility] on Monday 02/01/2017.    They know to contact the office with any additional concerns or questions.     [name]. [name], [place] Patient [place] [phone] 01/29/2017 12:25 PM

## 2017-01-29 NOTE — Telephone Encounter (Signed)
Oral Oncology Patient Advocate Encounter  Prior Authorization for Wesley Bright has been approved.    PA# [id number] Effective dates: 01/29/2017 through 01/29/2018  Oral Oncology Clinic will continue to follow.   [name]. [name], [place] Patient [place] [phone] 01/29/2017 12:06 PM

## 2017-02-01 ENCOUNTER — Other Ambulatory Visit: Payer: BLUE CROSS/BLUE SHIELD

## 2017-02-01 ENCOUNTER — Telehealth: Payer: Self-pay

## 2017-02-01 ENCOUNTER — Encounter: Payer: Self-pay | Admitting: *Deleted

## 2017-02-01 ENCOUNTER — Other Ambulatory Visit: Payer: Self-pay | Admitting: Hematology and Oncology

## 2017-02-01 ENCOUNTER — Encounter: Payer: Self-pay | Admitting: Hematology and Oncology

## 2017-02-01 ENCOUNTER — Telehealth: Payer: Self-pay | Admitting: *Deleted

## 2017-02-01 DIAGNOSIS — C9110 Chronic lymphocytic leukemia of B-cell type not having achieved remission: Secondary | ICD-10-CM

## 2017-02-01 DIAGNOSIS — E883 Tumor lysis syndrome: Secondary | ICD-10-CM

## 2017-02-01 DIAGNOSIS — Z9189 Other specified personal risk factors, not elsewhere classified: Secondary | ICD-10-CM

## 2017-02-01 NOTE — Progress Notes (Signed)
PA requested is for Imbruvica which is handled by Oral chemo clinic. Forwarded to [employer] but already shows she obtained approval.

## 2017-02-01 NOTE — Progress Notes (Signed)
[facility] Follow-up Visit:  Assessment: CLL (chronic lymphocytic leukemia) ([facility]) 66 year old male with diagnosis of chronic lymphocytic leukemia. Based on presence of lymphadenopathy and splenomegaly in addition to the lymphocytosis, patient is staged at [place] [facility]. Patient does have a mild thrombocytopenia, but that does not meet the diagnostic threshold to be considered stage IV. Prognostic evaluation demonstrates presence of isolated deletion 13q which places patient in good prognostic category along side negative testing for CD38.   Normally, I would not recommend treating the stage of disease, but patient has expressed significant concern about the degree of fatigue he has been experiencing. With that in mind, considering his excellent baseline performance status, I have offered patient a treatment with FCR combination of chemotherapy containing fludarabine, cyclophosphamide, and rituximab, additionakl options discussed included bendamustine + rituximab and ibrutinib. Initially, patient did not want to proceed with any therapy based on his apprehension about receiving systemic chemotherapy. Subsequently, he had more discussion with his family and has decided that he is willing to attempt to receive treatment with ibrutinib.  The purposes of treatment being palliative, schedule of administration, expected toxicities including increased risk of bleeding, swelling in the lower extremities, potential for infections have been discussed with the patient in detail. He voiced good understanding of the provider information and continued desire to receive therapy.  Plan: --Start Ibrutinib 48m PO QDay --Start Allopurinol 3033mPO QDay for hyperuricemia prevention --Labs QMon/Thu to monitor for potential tumor lysis syndrome that may develop --Return to clinic in 2 weeks with labs to monitor for disease response  Voice recognition software was used and creation of this note. Despite my  best effort at editing the text, some misspelling/errors may have occurred.  Orders Placed This Encounter  Procedures  . CBC & Diff and Retic    Standing Status:   Future    Number of Occurrences:   1    Standing Expiration Date:   01/27/2018  . Comprehensive metabolic panel    Standing Status:   Future    Number of Occurrences:   1    Standing Expiration Date:   01/27/2018  . Lactate dehydrogenase (LDH)    Standing Status:   Future    Number of Occurrences:   1    Standing Expiration Date:   01/27/2018  . Uric acid    Standing Status:   Future    Number of Occurrences:   1    Standing Expiration Date:   01/27/2018  . Phosphorus    Standing Status:   Future    Number of Occurrences:   1    Standing Expiration Date:   01/27/2018  . Magnesium    Standing Status:   Future    Number of Occurrences:   1    Standing Expiration Date:   01/27/2018    Cancer Staging CLL (chronic lymphocytic leukemia) ([facility]: Chronic Lymphocytic Leukemia / Small Lymphocytic Lymphoma, AJCC 8th Edition - Clinical stage from 12/30/2016: Modified Rai Stage II (Modified Rai risk: Intermediate, Lymphocytosis: Present, Adenopathy: Present, Organomegaly: Present, Anemia: Absent, Thrombocytopenia: Absent) - Signed by PeArdath SaxMD on 12/30/2016   All questions were answered. . The patient knows to call the clinic with any problems, questions or concerns.  This note was electronically signed.    History of Presenting Illness GyEllen Bright a [age].o. male [facility] diagnosis of chronic lymphocytic leukemia, Rai II, del13q-positive. Patient initially presented to his primary care provider  with complaints of progressive fatigue, excessive tiredness, decreasing ability to participate in the work and daily activities for the past 4-5 months. Patient also has had a weight loss of about 15 pounds over the same period of time.   Last visit to the clinic, we have discussed potential  management options including observation vs active interventions such as systemic muli-agent chemoimmunotherapy and ibrutinib. Patient returns to the clinic today after deciding to initiate therapy with ibrutinib as he is very apprehensive about chemotherapy.  Patient denies any new complaints since the last visit to the clinic other than persistent fatigue.  Oncological/hematological History: --Labs, 12/15/16: WBC 144, ANC 2.4, ALC 141, Mono 0.2, Baso 0.6, Hgb 13.7, Plt 109; Peripheral blood smear positive for atypical lymphocytes and smudge cells; LDH 159, beta-2 microglobulin 2.7, Haptoglobin 220, IgG 725. --Labs, 01/28/17: WBC 138, ANC 2.1, ALC 133, Hgb 13.1, Plt 104; LDH 157  Oncological/hematological History:   CLL (chronic lymphocytic leukemia) ([facility])   12/15/2016 Pathology Results    pBlood Flow/CLL FISH: Peripheral blood flow cytometry positive for presence of monoclonal B cells. IHC -- positive for CD5, CD19, CD20, CD21, CD22, CD23, HLA-DR, and lambda light-chains. FISH -- positive for heterozygous del13q, negative for del 17p, del 11q, and tr 12.      12/22/2016 Imaging    CT N/C/A/P: Diffuse non-bulky lymphadenopathy in the chest, abdomen, and pelvis and no significant lymphadenopathy in the neck. Splenomegaly with spleen measuring 13.8 cm.      12/28/2016 Initial Diagnosis    CLL (chronic lymphocytic leukemia) ([place])      12/29/2016 Pathology Results    Bone Marrow Bx: The sections show a cellularity ranging from 40 to 70% focally with numerous variably sized interstitial and paratrabecular lymphoid aggregates in addition to interstitial infiltrates of primarily small lymphoid cells with high nuclear cytoplasmic ratio, round to slightly irregular nuclei, dense chromatin and small to inconspicuous nucleoli. This is admixed with scattering of prolymphocytes/paraimmunoblasts including the presence of small proliferation centers in some aggregates. The background shows trilineage  hematopoiesis including abundance of megakaryocytes. The lymphoid aggregates are overwhelmingly composed of B cells as seen with CD20 and CD79a associated with CD5 co-expression. Cyclin-D1 is generally negative although there are scattered positive cells most likely correlating with the presence of prolymphocytes/paraimmunoblasts. There is an admixed very minor T cell population as highlighted with CD3. Erythroid precursors: Relatively decreased in number but with orderly and progressive maturation. Granulocytic precursors: Relatively decreased in number but with orderly and progressive maturation. Megakaryocytes: Abundant with predominantly normal morphology. Lymphocytes/plasma cells: The lymphocytes are markedly increased in number representing 80% of all cells and mostly consist of small lymphoid cells with very high nuclear cytoplasmic ratio, round to slightly irregular nuclei, coarse chromatin and generally inconspicuous nucleoli. Large plasma cell aggregates are not present.       01/27/2017 -  Chemotherapy    Ibrutinib 464m PO QDay        Medical History: Past Medical History:  Diagnosis Date  . External hemorrhoid     Surgical History: Past Surgical History:  Procedure Laterality Date  . APPENDECTOMY    . INGUINAL HERNIA REPAIR     left  . KNEE SURGERY     Left Knee    Family History: Family History  Problem Relation Age of Onset  . Liver cancer Brother   . Cancer Brother        Liver  . Esophageal cancer Father   . Cancer Father  Throat   . Cancer Mother        Bladder - cause of death    Social History: Social History   Social History  . Marital status: Married    Spouse name: N/A  . Number of children: 2  . Years of education: N/A   Occupational History  . 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 Smoking status: Current Every Day Smoker    Packs/day: 0.50  . Smokeless tobacco: Never Used  . Alcohol use Yes     Comment:  occasional  . Drug use: No  . Sexual activity: Not on file   Other Topics Concern  . Not on file   Social History Narrative  . No narrative on file    Allergies: No Known Allergies  Medications:  Current Outpatient Prescriptions  Medication Sig Dispense Refill  . allopurinol (ZYLOPRIM) 300 MG tablet Take 1 tablet (300 mg total) by mouth daily. 30 tablet 3  . cholecalciferol (VITAMIN D) 1000 units tablet Take 1,000 Units by mouth daily.    Marland Kitchen co-enzyme Q-10 30 MG capsule Take 30 mg by mouth 3 (three) times daily.    . Ibrutinib 420 MG TABS Take 420 mg by mouth daily. Take with a glass of water at the same time each day. Maintain hydration. 28 tablet 0  . vitamin B-12 (CYANOCOBALAMIN) 100 MCG tablet Take 100 mcg by mouth daily.     No current facility-administered medications for this visit.     Review of Systems: Review of Systems  Constitutional: Positive for appetite change, fatigue and unexpected weight change. Negative for diaphoresis and fever.  HENT:   Negative for hearing loss, lump/mass, mouth sores, nosebleeds, sore throat, tinnitus, trouble swallowing and voice change.   Eyes: Negative for eye problems and icterus.  Respiratory: Negative for chest tightness, cough, hemoptysis, shortness of breath and wheezing.   Cardiovascular: Negative for chest pain, leg swelling and palpitations.  Gastrointestinal: Positive for abdominal distention and constipation. Negative for abdominal pain, blood in stool, diarrhea, nausea, rectal pain and vomiting.  Genitourinary: Negative.    Musculoskeletal: Negative.   Skin: Negative.   Neurological: Negative.   Hematological: Negative.   Psychiatric/Behavioral: Negative.      PHYSICAL EXAMINATION Blood pressure (!) 128/98, pulse 64, temperature 99.1 F (37.3 C), temperature source Oral, resp. rate 16, height '5\' 10"'  (1.778 m), weight 193 lb 11.2 oz (87.9 kg), SpO2 98 %.  ECOG PERFORMANCE STATUS: 1 - Symptomatic but completely  ambulatory  Physical Exam  Constitutional: He is oriented to person, place, and time and well-developed, well-nourished, and in no distress. He appears not lethargic, not malnourished and not jaundiced. He appears not cachectic. No distress.  HENT:  Head: Normocephalic and atraumatic.  Mouth/Throat: Oropharynx is clear and moist and mucous membranes are normal. He has dentures.  Patient has no residual natural teeth with both upper and lower dentures.  Eyes: Pupils are equal, round, and reactive to light. Conjunctivae and EOM are normal. No scleral icterus.  Neck: No JVD present. No thyroid mass and no thyromegaly present.  Cardiovascular: Normal rate, regular rhythm, S1 normal and S2 normal.  Exam reveals gallop.   No murmur heard. Pulmonary/Chest: Effort normal and breath sounds normal. He has no decreased breath sounds. He has no wheezes. He has no rhonchi. He has no rales.  Abdominal:  Abdomen appears to be slightly distended, but soft, nontender and without palpable masses or hepatosplenomegaly  Lymphadenopathy:  Head (right side): No submandibular, no posterior auricular and no occipital adenopathy present.       Head (left side): No submandibular, no posterior auricular and no occipital adenopathy present.    He has no cervical adenopathy.    He has no axillary adenopathy.       Right: No inguinal and no supraclavicular adenopathy present.       Left: No inguinal and no supraclavicular adenopathy present.  Neurological: He is alert and oriented to person, place, and time. He has normal sensation, normal strength, normal reflexes and intact cranial nerves. He appears not lethargic.  Skin: He is not diaphoretic. No pallor.     LABORATORY DATA: I have personally reviewed the data as listed: No visits with results within 1 Week(s) from this visit.  Latest known visit with results is:  Hospital Outpatient Visit on 12/29/2016  Component Date Value Ref Range Status  . WBC  12/29/2016 121.4* 4.0 - 10.5 K/uL Final   Comment: CRITICAL RESULT CALLED TO, READ BACK BY AND VERIFIED WITH: BENFIELD,L. RN '@[id number]'  ON 8.14.18 BY NMCCOY   . RBC 12/29/2016 4.34  4.22 - 5.81 MIL/uL Final  . Hemoglobin 12/29/2016 12.5* 13.0 - 17.0 g/dL Final  . HCT 12/29/2016 39.8  39.0 - 52.0 % Final  . MCV 12/29/2016 91.7  78.0 - 100.0 fL Final  . MCH 12/29/2016 28.8  26.0 - 34.0 pg Final  . MCHC 12/29/2016 31.4  30.0 - 36.0 g/dL Final  . RDW 12/29/2016 15.0  11.5 - 15.5 % Final  . Platelets 12/29/2016 106* 150 - 400 K/uL Final   Comment: REPEATED TO VERIFY SPECIMEN CHECKED FOR CLOTS PLATELET COUNT CONFIRMED BY SMEAR   . Neutrophils Relative % 12/29/2016 2  % Final  . Lymphocytes Relative 12/29/2016 97  % Final  . Monocytes Relative 12/29/2016 1  % Final  . Eosinophils Relative 12/29/2016 0  % Final  . Basophils Relative 12/29/2016 0  % Final  . Neutro Abs 12/29/2016 2.4  1.7 - 7.7 K/uL Final  . Lymphs Abs 12/29/2016 117.8* 0.7 - 4.0 K/uL Final  . Monocytes Absolute 12/29/2016 1.2* 0.1 - 1.0 K/uL Final  . Eosinophils Absolute 12/29/2016 0.0  0.0 - 0.7 K/uL Final  . Basophils Absolute 12/29/2016 0.0  0.0 - 0.1 K/uL Final  . WBC Morphology 12/29/2016 ABSOLUTE LYMPHOCYTOSIS   Final   Comment: ATYPICAL LYMPHOCYTES SMUDGE CELLS   . Prothrombin Time 12/29/2016 13.0  11.4 - 15.2 seconds Final  . INR 12/29/2016 0.98   Final       Ardath Sax, MD

## 2017-02-01 NOTE — Progress Notes (Signed)
PA fax received for medication, name not provided.  PA form left in PA box in HIM.

## 2017-02-01 NOTE — Telephone Encounter (Signed)
Per Dr. Lebron Conners, pt started new medication last week, will need twice weekly labs starting today. Scheduling message sent.  Per wife, pt will only start medication today.  Instructed pt to start labs twice weekly starting this coming Thursday.  Pt verbalized understanding.

## 2017-02-01 NOTE — Assessment & Plan Note (Signed)
66 year old male with diagnosis of chronic lymphocytic leukemia. Based on presence of lymphadenopathy and splenomegaly in addition to the lymphocytosis, patient is staged at [place] II. Patient does have a mild thrombocytopenia, but that does not meet the diagnostic threshold to be considered stage IV. Prognostic evaluation demonstrates presence of isolated deletion 13q which places patient in good prognostic category along side negative testing for CD38.   Normally, I would not recommend treating the stage of disease, but patient has expressed significant concern about the degree of fatigue he has been experiencing. With that in mind, considering his excellent baseline performance status, I have offered patient a treatment with FCR combination of chemotherapy containing fludarabine, cyclophosphamide, and rituximab, additionakl options discussed included bendamustine + rituximab and ibrutinib. Initially, patient did not want to proceed with any therapy based on his apprehension about receiving systemic chemotherapy. Subsequently, he had more discussion with his family and has decided that he is willing to attempt to receive treatment with ibrutinib.  The purposes of treatment being palliative, schedule of administration, expected toxicities including increased risk of bleeding, swelling in the lower extremities, potential for infections have been discussed with the patient in detail. He voiced good understanding of the provider information and continued desire to receive therapy.  Plan: --Start Ibrutinib 420mg  PO QDay --Start Allopurinol 300mg  PO QDay for hyperuricemia prevention --Labs QMon/Thu to monitor for potential tumor lysis syndrome that may develop --Return to clinic in 2 weeks with labs to monitor for disease response

## 2017-02-01 NOTE — Telephone Encounter (Signed)
Called and left voice message with upcoming appointments. Also made appointment for 9/17 @ 4pm. Per 9/17 los.

## 2017-02-04 ENCOUNTER — Other Ambulatory Visit (HOSPITAL_BASED_OUTPATIENT_CLINIC_OR_DEPARTMENT_OTHER): Payer: BLUE CROSS/BLUE SHIELD

## 2017-02-04 DIAGNOSIS — C9110 Chronic lymphocytic leukemia of B-cell type not having achieved remission: Secondary | ICD-10-CM | POA: Diagnosis not present

## 2017-02-04 DIAGNOSIS — Z9189 Other specified personal risk factors, not elsewhere classified: Secondary | ICD-10-CM

## 2017-02-04 LAB — COMPREHENSIVE METABOLIC PANEL
ALBUMIN: 3.8 g/dL (ref 3.5–5.0)
ALK PHOS: 119 U/L (ref 40–150)
ALT: 9 U/L (ref 0–55)
AST: 15 U/L (ref 5–34)
Anion Gap: 8 mEq/L (ref 3–11)
BILIRUBIN TOTAL: 0.52 mg/dL (ref 0.20–1.20)
BUN: 15.8 mg/dL (ref 7.0–26.0)
CO2: 22 mEq/L (ref 22–29)
CREATININE: 1.3 mg/dL (ref 0.7–1.3)
Calcium: 9.1 mg/dL (ref 8.4–10.4)
Chloride: 109 mEq/L (ref 98–109)
EGFR: 58 mL/min/{1.73_m2} — AB (ref >90)
GLUCOSE: 98 mg/dL (ref 70–140)
Potassium: 4 mEq/L (ref 3.5–5.1)
SODIUM: 139 meq/L (ref 136–145)
TOTAL PROTEIN: 6.6 g/dL (ref 6.4–8.3)

## 2017-02-04 LAB — CBC WITH DIFFERENTIAL/PLATELET
BASO%: 0.4 % (ref 0.0–2.0)
BASOS ABS: 0.7 10*3/uL — AB (ref 0.0–0.1)
EOS%: 0.1 % (ref 0.0–7.0)
Eosinophils Absolute: 0.1 10*3/uL (ref 0.0–0.5)
HCT: 39.2 % (ref 38.4–49.9)
HGB: 12.3 g/dL — ABNORMAL LOW (ref 13.0–17.1)
LYMPH%: 97.1 % — AB (ref 14.0–49.0)
MCH: 29.6 pg (ref 27.2–33.4)
MCHC: 31.4 g/dL — AB (ref 32.0–36.0)
MCV: 94.2 fL (ref 79.3–98.0)
MONO#: 2.2 10*3/uL — ABNORMAL HIGH (ref 0.1–0.9)
MONO%: 1.3 % (ref 0.0–14.0)
NEUT#: 2 10*3/uL (ref 1.5–6.5)
NEUT%: 1.1 % — AB (ref 39.0–75.0)
Platelets: 99 10*3/uL — ABNORMAL LOW (ref 140–400)
RBC: 4.16 10*6/uL — ABNORMAL LOW (ref 4.20–5.82)
RDW: 15.2 % — ABNORMAL HIGH (ref 11.0–14.6)
WBC: 173.5 10*3/uL (ref 4.0–10.3)
lymph#: 168.4 10*3/uL — ABNORMAL HIGH (ref 0.9–3.3)
nRBC: 0 % (ref 0–0)

## 2017-02-04 LAB — MAGNESIUM: MAGNESIUM: 2.1 mg/dL (ref 1.5–2.5)

## 2017-02-04 LAB — URIC ACID: Uric Acid, Serum: 4.8 mg/dl (ref 2.6–7.4)

## 2017-02-04 LAB — TECHNOLOGIST REVIEW

## 2017-02-05 LAB — PHOSPHORUS: PHOSPHORUS: 3.2 mg/dL (ref 2.5–4.5)

## 2017-02-08 ENCOUNTER — Other Ambulatory Visit (HOSPITAL_BASED_OUTPATIENT_CLINIC_OR_DEPARTMENT_OTHER): Payer: BLUE CROSS/BLUE SHIELD

## 2017-02-08 ENCOUNTER — Encounter (HOSPITAL_COMMUNITY): Payer: Self-pay

## 2017-02-08 DIAGNOSIS — C9110 Chronic lymphocytic leukemia of B-cell type not having achieved remission: Secondary | ICD-10-CM

## 2017-02-08 DIAGNOSIS — Z9189 Other specified personal risk factors, not elsewhere classified: Secondary | ICD-10-CM

## 2017-02-08 LAB — COMPREHENSIVE METABOLIC PANEL
ALBUMIN: 3.4 g/dL — AB (ref 3.5–5.0)
ALK PHOS: 117 U/L (ref 40–150)
ALT: 8 U/L (ref 0–55)
ANION GAP: 6 meq/L (ref 3–11)
AST: 14 U/L (ref 5–34)
BILIRUBIN TOTAL: 0.3 mg/dL (ref 0.20–1.20)
BUN: 13.2 mg/dL (ref 7.0–26.0)
CO2: 26 meq/L (ref 22–29)
CREATININE: 1.3 mg/dL (ref 0.7–1.3)
Calcium: 8.9 mg/dL (ref 8.4–10.4)
Chloride: 111 mEq/L — ABNORMAL HIGH (ref 98–109)
EGFR: 59 mL/min/{1.73_m2} — AB (ref >90)
GLUCOSE: 107 mg/dL (ref 70–140)
Potassium: 3.8 mEq/L (ref 3.5–5.1)
SODIUM: 143 meq/L (ref 136–145)
TOTAL PROTEIN: 6.5 g/dL (ref 6.4–8.3)

## 2017-02-08 LAB — CBC WITH DIFFERENTIAL/PLATELET
BASO%: 0.3 % (ref 0.0–2.0)
BASOS ABS: 0.5 10*3/uL — AB (ref 0.0–0.1)
EOS%: 0.1 % (ref 0.0–7.0)
Eosinophils Absolute: 0.2 10*3/uL (ref 0.0–0.5)
HCT: 38.3 % — ABNORMAL LOW (ref 38.4–49.9)
HGB: 11.9 g/dL — ABNORMAL LOW (ref 13.0–17.1)
LYMPH#: 143.8 10*3/uL — AB (ref 0.9–3.3)
LYMPH%: 97.4 % — AB (ref 14.0–49.0)
MCH: 29.3 pg (ref 27.2–33.4)
MCHC: 31.1 g/dL — AB (ref 32.0–36.0)
MCV: 94.3 fL (ref 79.3–98.0)
MONO#: 1 10*3/uL — ABNORMAL HIGH (ref 0.1–0.9)
MONO%: 0.7 % (ref 0.0–14.0)
NEUT#: 2.2 10*3/uL (ref 1.5–6.5)
NEUT%: 1.5 % — AB (ref 39.0–75.0)
Platelets: 109 10*3/uL — ABNORMAL LOW (ref 140–400)
RBC: 4.06 10*6/uL — AB (ref 4.20–5.82)
RDW: 14.9 % — ABNORMAL HIGH (ref 11.0–14.6)
WBC: 147.7 10*3/uL (ref 4.0–10.3)
nRBC: 0 % (ref 0–0)

## 2017-02-08 LAB — URIC ACID: URIC ACID, SERUM: 5.3 mg/dL (ref 2.6–7.4)

## 2017-02-08 LAB — TECHNOLOGIST REVIEW

## 2017-02-08 LAB — MAGNESIUM: MAGNESIUM: 2.2 mg/dL (ref 1.5–2.5)

## 2017-02-09 LAB — PHOSPHORUS: PHOSPHORUS: 2.5 mg/dL (ref 2.5–4.5)

## 2017-02-11 ENCOUNTER — Ambulatory Visit (HOSPITAL_BASED_OUTPATIENT_CLINIC_OR_DEPARTMENT_OTHER): Payer: BLUE CROSS/BLUE SHIELD | Admitting: Hematology and Oncology

## 2017-02-11 ENCOUNTER — Other Ambulatory Visit (HOSPITAL_BASED_OUTPATIENT_CLINIC_OR_DEPARTMENT_OTHER): Payer: BLUE CROSS/BLUE SHIELD

## 2017-02-11 ENCOUNTER — Telehealth: Payer: Self-pay | Admitting: Hematology and Oncology

## 2017-02-11 VITALS — BP 141/68 | HR 67 | Temp 98.7°F | Resp 18 | Ht 70.0 in | Wt 191.6 lb

## 2017-02-11 DIAGNOSIS — C9110 Chronic lymphocytic leukemia of B-cell type not having achieved remission: Secondary | ICD-10-CM

## 2017-02-11 DIAGNOSIS — Z9189 Other specified personal risk factors, not elsewhere classified: Secondary | ICD-10-CM

## 2017-02-11 LAB — COMPREHENSIVE METABOLIC PANEL
ALBUMIN: 3.7 g/dL (ref 3.5–5.0)
ALK PHOS: 120 U/L (ref 40–150)
ALT: 8 U/L (ref 0–55)
AST: 14 U/L (ref 5–34)
Anion Gap: 7 mEq/L (ref 3–11)
BUN: 12.3 mg/dL (ref 7.0–26.0)
CALCIUM: 9.3 mg/dL (ref 8.4–10.4)
CO2: 24 mEq/L (ref 22–29)
CREATININE: 1.2 mg/dL (ref 0.7–1.3)
Chloride: 110 mEq/L — ABNORMAL HIGH (ref 98–109)
EGFR: 64 mL/min/{1.73_m2} — ABNORMAL LOW (ref >90)
GLUCOSE: 87 mg/dL (ref 70–140)
Potassium: 4.7 mEq/L (ref 3.5–5.1)
SODIUM: 141 meq/L (ref 136–145)
Total Bilirubin: 0.37 mg/dL (ref 0.20–1.20)
Total Protein: 6.8 g/dL (ref 6.4–8.3)

## 2017-02-11 LAB — CBC WITH DIFFERENTIAL/PLATELET
BASO%: 0.1 % (ref 0.0–2.0)
Basophils Absolute: 0.2 10*3/uL — ABNORMAL HIGH (ref 0.0–0.1)
EOS ABS: 0.3 10*3/uL (ref 0.0–0.5)
EOS%: 0.2 % (ref 0.0–7.0)
HCT: 41.2 % (ref 38.4–49.9)
HGB: 12.5 g/dL — ABNORMAL LOW (ref 13.0–17.1)
LYMPH%: 94.4 % — AB (ref 14.0–49.0)
MCH: 28.8 pg (ref 27.2–33.4)
MCHC: 30.4 g/dL — AB (ref 32.0–36.0)
MCV: 94.8 fL (ref 79.3–98.0)
MONO#: 5.6 10*3/uL — AB (ref 0.1–0.9)
MONO%: 3.1 % (ref 0.0–14.0)
NEUT%: 2.2 % — AB (ref 39.0–75.0)
NEUTROS ABS: 3.9 10*3/uL (ref 1.5–6.5)
PLATELETS: 134 10*3/uL — AB (ref 140–400)
RBC: 4.35 10*6/uL (ref 4.20–5.82)
RDW: 15 % — ABNORMAL HIGH (ref 11.0–14.6)
WBC: 177.3 10*3/uL — AB (ref 4.0–10.3)
lymph#: 167.3 10*3/uL — ABNORMAL HIGH (ref 0.9–3.3)

## 2017-02-11 LAB — TECHNOLOGIST REVIEW

## 2017-02-11 LAB — MAGNESIUM: MAGNESIUM: 2.1 mg/dL (ref 1.5–2.5)

## 2017-02-11 LAB — URIC ACID: Uric Acid, Serum: 5 mg/dl (ref 2.6–7.4)

## 2017-02-11 NOTE — Telephone Encounter (Signed)
Gave avs and calendar for October  °

## 2017-02-12 LAB — CHROMOSOME ANALYSIS, BONE MARROW

## 2017-02-12 LAB — PHOSPHORUS: PHOSPHORUS: 3 mg/dL (ref 2.5–4.5)

## 2017-02-15 ENCOUNTER — Other Ambulatory Visit: Payer: BLUE CROSS/BLUE SHIELD

## 2017-02-18 ENCOUNTER — Other Ambulatory Visit: Payer: BLUE CROSS/BLUE SHIELD

## 2017-02-18 NOTE — Progress Notes (Signed)
[name] Follow-up Visit:  Assessment: CLL (chronic lymphocytic leukemia) ([place]) 66 y.o. male with diagnosis of chronic lymphocytic leukemia. Based on presence of lymphadenopathy and splenomegaly in addition to the lymphocytosis, patient is staged at [place] [facility]. Patient does have a mild thrombocytopenia, but that does not meet the diagnostic threshold to be considered stage IV. Prognostic evaluation demonstrates presence of isolated deletion 13q which places patient in good prognostic category along side negative testing for CD38.   Normally, I would not recommend treating the stage of disease, but patient has expressed significant concern about the degree of fatigue he has been experiencing. With that in mind, considering his excellent baseline performance status, I have offered patient a treatment with FCR combination of chemotherapy containing fludarabine, cyclophosphamide, and rituximab, additionakl options discussed included bendamustine + rituximab and ibrutinib. Initially, patient did not want to proceed with any therapy based on his apprehension about receiving systemic chemotherapy. Subsequently, he had more discussion with his family and has decided that he is willing to attempt to receive treatment with ibrutinib.  The medication was started at the last clinic visit. Since the initiation of medication, patient has developed some side effects, and petechial rash over the lower extremities. The side effects exceed grade 1 at this point in time. Clinical evaluation and lab work are permissive to continue medication unchanged. Of note, elevated white blood cell count is typical for effective ibrutinib and does not constitute disease progression.   Plan: --Continue Ibrutinib 479m PO QDay --Continue Allopurinol 3029mPO QDay for hyperuricemia prevention --Labs QMon/Thu to monitor for potential tumor lysis syndrome that may develop --Return to clinic in 4 weeks with labs to  monitor for disease response  Voice recognition software was used and creation of this note. Despite my best effort at editing the text, some misspelling/errors may have occurred.  Orders Placed This Encounter  Procedures  . CBC with Differential    Standing Status:   Future    Standing Expiration Date:   02/11/2018  . Comprehensive metabolic panel    Standing Status:   Future    Standing Expiration Date:   02/11/2018  . CBC with Differential    Standing Status:   Future    Standing Expiration Date:   02/11/2018  . Comprehensive metabolic panel    Standing Status:   Future    Standing Expiration Date:   02/11/2018  . Lactate dehydrogenase (LDH)    Standing Status:   Future    Standing Expiration Date:   02/11/2018  . Uric acid    Standing Status:   Future    Standing Expiration Date:   02/11/2018    Cancer Staging [place]chronic lymphocytic [place]) [place]: Chronic Lymphocytic Leukemia / Small Lymphocytic Lymphoma, AJCC 8th Edition - Clinical stage from 12/30/2016: Modified Rai Stage II (Modified Rai risk: Intermediate, Lymphocytosis: Present, Adenopathy: Present, Organomegaly: Present, Anemia: Absent, Thrombocytopenia: Absent) - Signed by Wesley SaxMD on 12/30/2016   All questions were answered. . The patient knows to call the clinic with any problems, questions or concerns.  This note was electronically signed.    History of Presenting Illness [name] a 89.o. male [place] in [place] diagnosis of chronic lymphocytic leukemia, Rai II, del13q-positive. Patient initially presented to his primary care provider with complaints of progressive fatigue, excessive tiredness, decreasing ability to participate in the work and daily activities for the past 4-5 months. Patient also has had a weight loss  of about 15 pounds over the same period of time.   Last visit to the clinic, we have discussed potential management options including observation vs  active interventions such as systemic muli-agent chemoimmunotherapy and ibrutinib. Patient returns to the clinic today after deciding to initiate therapy with ibrutinib as he is very apprehensive about chemotherapy.  At the present time, patient complains of mild petechial rash over bilateral lower extremities. Also complains of persistent bloating and early satiety, constipation. Complaining of decreased oral intake, decreased urine output, and watery stools.  Oncological/hematological History: --Labs, 12/15/16: WBC 144, ANC 2.4, ALC 141, Mono 0.2, Baso 0.6, Hgb 13.7, Plt 109; Peripheral blood smear positive for atypical lymphocytes and smudge cells; LDH 159, beta-2 microglobulin 2.7, Haptoglobin 220, IgG 725. --Labs, 01/28/17: WBC 138, ANC 2.1, ALC 133, Hgb 13.1, Plt 104; LDH 157  Oncological/hematological History:   CLL (chronic lymphocytic leukemia) ([place])   12/15/2016 Pathology Results    pBlood Flow/CLL FISH: Peripheral blood flow cytometry positive for presence of monoclonal B cells. IHC -- positive for CD5, CD19, CD20, CD21, CD22, CD23, HLA-DR, and lambda light-chains. FISH -- positive for heterozygous del13q, negative for del 17p, del 11q, and tr 12.      12/22/2016 Imaging    CT N/C/A/P: Diffuse non-bulky lymphadenopathy in the chest, abdomen, and pelvis and no significant lymphadenopathy in the neck. Splenomegaly with spleen measuring 13.8 cm.      12/28/2016 Initial Diagnosis    CLL (chronic lymphocytic leukemia) ([place])      12/29/2016 Pathology Results    Bone Marrow Bx: The sections show a cellularity ranging from 40 to 70% focally with numerous variably sized interstitial and paratrabecular lymphoid aggregates in addition to interstitial infiltrates of primarily small lymphoid cells with high nuclear cytoplasmic ratio, round to slightly irregular nuclei, dense chromatin and small to inconspicuous nucleoli. This is admixed with scattering of prolymphocytes/paraimmunoblasts including  the presence of small proliferation centers in some aggregates. The background shows trilineage hematopoiesis including abundance of megakaryocytes. The lymphoid aggregates are overwhelmingly composed of B cells as seen with CD20 and CD79a associated with CD5 co-expression. Cyclin-D1 is generally negative although there are scattered positive cells most likely correlating with the presence of prolymphocytes/paraimmunoblasts. There is an admixed very minor T cell population as highlighted with CD3. Erythroid precursors: Relatively decreased in number but with orderly and progressive maturation. Granulocytic precursors: Relatively decreased in number but with orderly and progressive maturation. Megakaryocytes: Abundant with predominantly normal morphology. Lymphocytes/plasma cells: The lymphocytes are markedly increased in number representing 80% of all cells and mostly consist of small lymphoid cells with very high nuclear cytoplasmic ratio, round to slightly irregular nuclei, coarse chromatin and generally inconspicuous nucleoli. Large plasma cell aggregates are not present.       01/27/2017 -  Chemotherapy    Ibrutinib 433m PO QDay        Medical History: Past Medical History:  Diagnosis Date  . External hemorrhoid     Surgical History: Past Surgical History:  Procedure Laterality Date  . APPENDECTOMY    . INGUINAL HERNIA REPAIR     left  . KNEE SURGERY     Left Knee    Family History: Family History  Problem Relation Age of Onset  . Liver cancer Brother   . Cancer Brother        Liver  . Esophageal cancer Father   . Cancer Father        Throat   . Cancer Mother  Bladder - cause of death    Social History: Social History   Social History  . Marital status: Married    Spouse name: N/A  . Number of children: 2  . Years of education: N/A   Occupational History  . 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 Smoking status: Current Every Day  Smoker    Packs/day: 0.50  . Smokeless tobacco: Never Used  . Alcohol use Yes     Comment: occasional  . Drug use: No  . Sexual activity: Not on file   Other Topics Concern  . Not on file   Social History Narrative  . No narrative on file    Allergies: No Known Allergies  Medications:  Current Outpatient Prescriptions  Medication Sig Dispense Refill  . allopurinol (ZYLOPRIM) 300 MG tablet Take 1 tablet (300 mg total) by mouth daily. 30 tablet 3  . cholecalciferol (VITAMIN D) 1000 units tablet Take 1,000 Units by mouth daily.    Marland Kitchen co-enzyme Q-10 30 MG capsule Take 30 mg by mouth 3 (three) times daily.    . Ibrutinib 420 MG TABS Take 420 mg by mouth daily. Take with a glass of water at the same time each day. Maintain hydration. 28 tablet 0  . vitamin B-12 (CYANOCOBALAMIN) 100 MCG tablet Take 100 mcg by mouth daily.     No current facility-administered medications for this visit.     Review of Systems: Review of Systems  Constitutional: Positive for appetite change, fatigue and unexpected weight change. Negative for diaphoresis and fever.  HENT:   Negative for hearing loss, lump/mass, mouth sores, nosebleeds, sore throat, tinnitus, trouble swallowing and voice change.   Eyes: Negative for eye problems and icterus.  Respiratory: Negative for chest tightness, cough, hemoptysis, shortness of breath and wheezing.   Cardiovascular: Negative for chest pain, leg swelling and palpitations.  Gastrointestinal: Positive for abdominal distention and constipation. Negative for abdominal pain, blood in stool, diarrhea, nausea, rectal pain and vomiting.  Genitourinary: Negative.    Musculoskeletal: Negative.   Skin: Negative.   Neurological: Negative.   Hematological: Negative.   Psychiatric/Behavioral: Negative.      PHYSICAL EXAMINATION Blood pressure (!) 141/68, pulse 67, temperature 98.7 F (37.1 C), temperature source Oral, resp. rate 18, height _0  (1.778 m), weight 191 lb  9.6 oz (86.9 kg), SpO2 100 %.  ECOG PERFORMANCE STATUS: 1 - Symptomatic but completely ambulatory  Physical Exam  Constitutional: He is oriented to person, place, and time and well-developed, well-nourished, and in no distress. He appears not lethargic, not malnourished and not jaundiced. He appears not cachectic. No distress.  HENT:  Head: Normocephalic and atraumatic.  Mouth/Throat: Oropharynx is clear and moist and mucous membranes are normal. He has dentures.  Patient has no residual natural teeth with both upper and lower dentures.  Eyes: Pupils are equal, round, and reactive to light. Conjunctivae and EOM are normal. No scleral icterus.  Neck: No JVD present. No thyroid mass and no thyromegaly present.  Cardiovascular: Normal rate, regular rhythm, S1 normal and S2 normal.  Exam reveals gallop.   No murmur heard. Pulmonary/Chest: Effort normal and breath sounds normal. He has no decreased breath sounds. He has no wheezes. He has no rhonchi. He has no rales.  Abdominal:  Abdomen appears to be slightly distended, but soft, nontender and without palpable masses or hepatosplenomegaly  Lymphadenopathy:       Head (right side): No submandibular, no posterior auricular and no occipital  adenopathy present.       Head (left side): No submandibular, no posterior auricular and no occipital adenopathy present.    He has no cervical adenopathy.    He has no axillary adenopathy.       Right: No inguinal and no supraclavicular adenopathy present.       Left: No inguinal and no supraclavicular adenopathy present.  Neurological: He is alert and oriented to person, place, and time. He has normal sensation, normal strength, normal reflexes and intact cranial nerves. He appears not lethargic.  Skin: He is not diaphoretic. No pallor.     LABORATORY DATA: I have personally reviewed the data as listed: Appointment on 02/11/2017  Component Date Value Ref Range Status  . Uric Acid, Serum 02/11/2017 5.0   2.6 - 7.4 mg/dl Final  . Phosphorus, Ser 02/11/2017 3.0  2.5 - 4.5 mg/dL Final  . Sodium 02/11/2017 141  136 - 145 mEq/L Final  . Potassium 02/11/2017 4.7  3.5 - 5.1 mEq/L Final  . Chloride 02/11/2017 110* 98 - 109 mEq/L Final  . CO2 02/11/2017 24  22 - 29 mEq/L Final  . Glucose 02/11/2017 87  70 - 140 mg/dl Final   Glucose reference range is for nonfasting patients. Fasting glucose reference range is 70- 100.  Marland Kitchen BUN 02/11/2017 12.3  7.0 - 26.0 mg/dL Final  . Creatinine 02/11/2017 1.2  0.7 - 1.3 mg/dL Final  . Total Bilirubin 02/11/2017 0.37  0.20 - 1.20 mg/dL Final  . Alkaline Phosphatase 02/11/2017 120  40 - 150 U/L Final  . AST 02/11/2017 14  5 - 34 U/L Final  . ALT 02/11/2017 8  0 - 55 U/L Final  . Total Protein 02/11/2017 6.8  6.4 - 8.3 g/dL Final  . Albumin 02/11/2017 3.7  3.5 - 5.0 g/dL Final  . Calcium 02/11/2017 9.3  8.4 - 10.4 mg/dL Final  . Anion Gap 02/11/2017 7  3 - 11 mEq/L Final  . EGFR 02/11/2017 64* >90 ml/min/1.73 m2 Final   eGFR is calculated using the CKD-EPI Creatinine Equation (2009)  . WBC 02/11/2017 177.3* 4.0 - 10.3 10e3/uL Final  . NEUT# 02/11/2017 3.9  1.5 - 6.5 10e3/uL Final  . HGB 02/11/2017 12.5* 13.0 - 17.1 g/dL Final  . HCT 02/11/2017 41.2  38.4 - 49.9 % Final  . Platelets 02/11/2017 134* 140 - 400 10e3/uL Final  . MCV 02/11/2017 94.8  79.3 - 98.0 fL Final  . MCH 02/11/2017 28.8  27.2 - 33.4 pg Final  . MCHC 02/11/2017 30.4* 32.0 - 36.0 g/dL Final  . RBC 02/11/2017 4.35  4.20 - 5.82 10e6/uL Final  . RDW 02/11/2017 15.0* 11.0 - 14.6 % Final  . lymph# 02/11/2017 167.3* 0.9 - 3.3 10e3/uL Final  . MONO# 02/11/2017 5.6* 0.1 - 0.9 10e3/uL Final  . Eosinophils Absolute 02/11/2017 0.3  0.0 - 0.5 10e3/uL Final  . Basophils Absolute 02/11/2017 0.2* 0.0 - 0.1 10e3/uL Final  . NEUT% 02/11/2017 2.2* 39.0 - 75.0 % Final  . LYMPH% 02/11/2017 94.4* 14.0 - 49.0 % Final  . MONO% 02/11/2017 3.1  0.0 - 14.0 % Final  . EOS% 02/11/2017 0.2  0.0 - 7.0 % Final  . BASO%  02/11/2017 0.1  0.0 - 2.0 % Final  . Magnesium 02/11/2017 2.1  1.5 - 2.5 mg/dl Final  . Technologist Review 02/11/2017 Variant lymphs and smudge cells present   Final  Appointment on 02/08/2017  Component Date Value Ref Range Status  . Uric Acid, Serum 02/08/2017 5.3  2.6 - 7.4 mg/dl Final  . Phosphorus, Ser 02/08/2017 2.5  2.5 - 4.5 mg/dL Final  . Sodium 02/08/2017 143  136 - 145 mEq/L Final  . Potassium 02/08/2017 3.8  3.5 - 5.1 mEq/L Final  . Chloride 02/08/2017 111* 98 - 109 mEq/L Final  . CO2 02/08/2017 26  22 - 29 mEq/L Final  . Glucose 02/08/2017 107  70 - 140 mg/dl Final   Glucose reference range is for nonfasting patients. Fasting glucose reference range is 70- 100.  Marland Kitchen BUN 02/08/2017 13.2  7.0 - 26.0 mg/dL Final  . Creatinine 02/08/2017 1.3  0.7 - 1.3 mg/dL Final  . Total Bilirubin 02/08/2017 0.30  0.20 - 1.20 mg/dL Final  . Alkaline Phosphatase 02/08/2017 117  40 - 150 U/L Final  . AST 02/08/2017 14  5 - 34 U/L Final  . ALT 02/08/2017 8  0 - 55 U/L Final  . Total Protein 02/08/2017 6.5  6.4 - 8.3 g/dL Final  . Albumin 02/08/2017 3.4* 3.5 - 5.0 g/dL Final  . Calcium 02/08/2017 8.9  8.4 - 10.4 mg/dL Final  . Anion Gap 02/08/2017 6  3 - 11 mEq/L Final  . EGFR 02/08/2017 59* >90 ml/min/1.73 m2 Final   eGFR is calculated using the CKD-EPI Creatinine Equation (2009)  . WBC 02/08/2017 147.7* 4.0 - 10.3 10e3/uL Final  . NEUT# 02/08/2017 2.2  1.5 - 6.5 10e3/uL Final  . HGB 02/08/2017 11.9* 13.0 - 17.1 g/dL Final  . HCT 02/08/2017 38.3* 38.4 - 49.9 % Final  . Platelets 02/08/2017 109* 140 - 400 10e3/uL Final  . MCV 02/08/2017 94.3  79.3 - 98.0 fL Final  . MCH 02/08/2017 29.3  27.2 - 33.4 pg Final  . MCHC 02/08/2017 31.1* 32.0 - 36.0 g/dL Final  . RBC 02/08/2017 4.06* 4.20 - 5.82 10e6/uL Final  . RDW 02/08/2017 14.9* 11.0 - 14.6 % Final  . lymph# 02/08/2017 143.8* 0.9 - 3.3 10e3/uL Final  . MONO# 02/08/2017 1.0* 0.1 - 0.9 10e3/uL Final  . Eosinophils Absolute 02/08/2017 0.2  0.0 -  0.5 10e3/uL Final  . Basophils Absolute 02/08/2017 0.5* 0.0 - 0.1 10e3/uL Final  . NEUT% 02/08/2017 1.5* 39.0 - 75.0 % Final  . LYMPH% 02/08/2017 97.4* 14.0 - 49.0 % Final  . MONO% 02/08/2017 0.7  0.0 - 14.0 % Final  . EOS% 02/08/2017 0.1  0.0 - 7.0 % Final  . BASO% 02/08/2017 0.3  0.0 - 2.0 % Final  . nRBC 02/08/2017 0  0 - 0 % Final  . Magnesium 02/08/2017 2.2  1.5 - 2.5 mg/dl Final  . Technologist Review 02/08/2017 Smudge cells and Variant lymphs, some cleaved, present   Final       Ardath Sax, MD

## 2017-02-18 NOTE — Assessment & Plan Note (Signed)
66 y.o. male with diagnosis of chronic lymphocytic leukemia. Based on presence of lymphadenopathy and splenomegaly in addition to the lymphocytosis, patient is staged at [place] [facility]. Patient does have a mild thrombocytopenia, but that does not meet the diagnostic threshold to be considered stage IV. Prognostic evaluation demonstrates presence of isolated deletion 13q which places patient in good prognostic category along side negative testing for CD38.   Normally, I would not recommend treating the stage of disease, but patient has expressed significant concern about the degree of fatigue he has been experiencing. With that in mind, considering his excellent baseline performance status, I have offered patient a treatment with FCR combination of chemotherapy containing fludarabine, cyclophosphamide, and rituximab, additionakl options discussed included bendamustine + rituximab and ibrutinib. Initially, patient did not want to proceed with any therapy based on his apprehension about receiving systemic chemotherapy. Subsequently, he had more discussion with his family and has decided that he is willing to attempt to receive treatment with ibrutinib.  The medication was started at the last clinic visit. Since the initiation of medication, patient has developed some side effects, and petechial rash over the lower extremities. The side effects exceed grade 1 at this point in time. Clinical evaluation and lab work are permissive to continue medication unchanged. Of note, elevated white blood cell count is typical for effective ibrutinib and does not constitute disease progression.   Plan: --Continue Ibrutinib 420mg  PO QDay --Continue Allopurinol 300mg  PO QDay for hyperuricemia prevention --Labs QMon/Thu to monitor for potential tumor lysis syndrome that may develop --Return to clinic in 4 weeks with labs to monitor for disease response

## 2017-02-22 ENCOUNTER — Other Ambulatory Visit: Payer: Self-pay | Admitting: Hematology and Oncology

## 2017-02-22 ENCOUNTER — Other Ambulatory Visit: Payer: BLUE CROSS/BLUE SHIELD

## 2017-02-22 DIAGNOSIS — C9110 Chronic lymphocytic leukemia of B-cell type not having achieved remission: Secondary | ICD-10-CM

## 2017-02-22 MED FILL — IMBRUVICA 420 MG TAB: 420 | 28 days supply | Qty: 28 | Fill #0

## 2017-02-25 ENCOUNTER — Other Ambulatory Visit (HOSPITAL_BASED_OUTPATIENT_CLINIC_OR_DEPARTMENT_OTHER): Payer: BLUE CROSS/BLUE SHIELD

## 2017-02-25 ENCOUNTER — Other Ambulatory Visit: Payer: BLUE CROSS/BLUE SHIELD

## 2017-02-25 DIAGNOSIS — C9110 Chronic lymphocytic leukemia of B-cell type not having achieved remission: Secondary | ICD-10-CM

## 2017-02-25 DIAGNOSIS — Z9189 Other specified personal risk factors, not elsewhere classified: Secondary | ICD-10-CM

## 2017-02-25 LAB — URIC ACID: Uric Acid, Serum: 4.5 mg/dl (ref 2.6–7.4)

## 2017-02-25 LAB — COMPREHENSIVE METABOLIC PANEL
ALBUMIN: 3.9 g/dL (ref 3.5–5.0)
ALK PHOS: 97 U/L (ref 40–150)
ALT: 14 U/L (ref 0–55)
ANION GAP: 9 meq/L (ref 3–11)
AST: 17 U/L (ref 5–34)
BILIRUBIN TOTAL: 0.25 mg/dL (ref 0.20–1.20)
BUN: 16.4 mg/dL (ref 7.0–26.0)
CO2: 22 mEq/L (ref 22–29)
Calcium: 9.2 mg/dL (ref 8.4–10.4)
Chloride: 110 mEq/L — ABNORMAL HIGH (ref 98–109)
Creatinine: 1.3 mg/dL (ref 0.7–1.3)
Glucose: 94 mg/dl (ref 70–140)
Potassium: 4.2 mEq/L (ref 3.5–5.1)
Sodium: 141 mEq/L (ref 136–145)
TOTAL PROTEIN: 6.7 g/dL (ref 6.4–8.3)

## 2017-02-25 LAB — CBC WITH DIFFERENTIAL/PLATELET
BASO%: 0.5 % (ref 0.0–2.0)
BASOS ABS: 1 10*3/uL — AB (ref 0.0–0.1)
EOS ABS: 0.3 10*3/uL (ref 0.0–0.5)
EOS%: 0.2 % (ref 0.0–7.0)
HCT: 42.4 % (ref 38.4–49.9)
HEMOGLOBIN: 12.9 g/dL — AB (ref 13.0–17.1)
LYMPH%: 96.8 % — ABNORMAL HIGH (ref 14.0–49.0)
MCH: 29.5 pg (ref 27.2–33.4)
MCHC: 30.4 g/dL — ABNORMAL LOW (ref 32.0–36.0)
MCV: 96.8 fL (ref 79.3–98.0)
MONO#: 2.9 10*3/uL — ABNORMAL HIGH (ref 0.1–0.9)
MONO%: 1.4 % (ref 0.0–14.0)
NEUT#: 2.5 10*3/uL (ref 1.5–6.5)
NEUT%: 1.1 % — ABNORMAL LOW (ref 39.0–75.0)
NRBC: 0 % (ref 0–0)
PLATELETS: 156 10*3/uL (ref 140–400)
RBC: 4.38 10*6/uL (ref 4.20–5.82)
RDW: 15.8 % — AB (ref 11.0–14.6)
WBC: 210 10*3/uL (ref 4.0–10.3)
lymph#: 203.2 10*3/uL — ABNORMAL HIGH (ref 0.9–3.3)

## 2017-02-25 LAB — TECHNOLOGIST REVIEW

## 2017-02-25 LAB — MAGNESIUM: MAGNESIUM: 2.4 mg/dL (ref 1.5–2.5)

## 2017-02-26 LAB — PHOSPHORUS: PHOSPHORUS: 3.1 mg/dL (ref 2.5–4.5)

## 2017-03-11 ENCOUNTER — Telehealth: Payer: Self-pay | Admitting: Hematology and Oncology

## 2017-03-11 ENCOUNTER — Other Ambulatory Visit (HOSPITAL_BASED_OUTPATIENT_CLINIC_OR_DEPARTMENT_OTHER): Payer: BLUE CROSS/BLUE SHIELD

## 2017-03-11 ENCOUNTER — Ambulatory Visit (HOSPITAL_BASED_OUTPATIENT_CLINIC_OR_DEPARTMENT_OTHER): Payer: BLUE CROSS/BLUE SHIELD | Admitting: Hematology and Oncology

## 2017-03-11 VITALS — BP 143/79 | HR 67 | Temp 98.4°F | Resp 18 | Ht 70.0 in | Wt 193.8 lb

## 2017-03-11 DIAGNOSIS — C9110 Chronic lymphocytic leukemia of B-cell type not having achieved remission: Secondary | ICD-10-CM

## 2017-03-11 LAB — COMPREHENSIVE METABOLIC PANEL
ALT: 8 U/L (ref 0–55)
ANION GAP: 8 meq/L (ref 3–11)
AST: 12 U/L (ref 5–34)
Albumin: 3.9 g/dL (ref 3.5–5.0)
Alkaline Phosphatase: 89 U/L (ref 40–150)
BILIRUBIN TOTAL: 0.55 mg/dL (ref 0.20–1.20)
BUN: 10.7 mg/dL (ref 7.0–26.0)
CO2: 24 meq/L (ref 22–29)
Calcium: 8.9 mg/dL (ref 8.4–10.4)
Chloride: 111 mEq/L — ABNORMAL HIGH (ref 98–109)
Creatinine: 1.3 mg/dL (ref 0.7–1.3)
EGFR: 57 mL/min/{1.73_m2} — AB (ref >60)
Glucose: 102 mg/dl (ref 70–140)
POTASSIUM: 3.7 meq/L (ref 3.5–5.1)
SODIUM: 143 meq/L (ref 136–145)
TOTAL PROTEIN: 6.5 g/dL (ref 6.4–8.3)

## 2017-03-11 LAB — CBC WITH DIFFERENTIAL/PLATELET
BASO%: 0.5 % (ref 0.0–2.0)
BASOS ABS: 1 10*3/uL — AB (ref 0.0–0.1)
EOS%: 0.1 % (ref 0.0–7.0)
Eosinophils Absolute: 0.1 10*3/uL (ref 0.0–0.5)
HCT: 46.5 % (ref 38.4–49.9)
HEMOGLOBIN: 13.3 g/dL (ref 13.0–17.1)
LYMPH#: 189.9 10*3/uL — AB (ref 0.9–3.3)
LYMPH%: 96.6 % — ABNORMAL HIGH (ref 14.0–49.0)
MCH: 29.4 pg (ref 27.2–33.4)
MCHC: 28.6 g/dL — ABNORMAL LOW (ref 32.0–36.0)
MCV: 102.9 fL — AB (ref 79.3–98.0)
MONO#: 2.6 10*3/uL — ABNORMAL HIGH (ref 0.1–0.9)
MONO%: 1.3 % (ref 0.0–14.0)
NEUT#: 3.2 10*3/uL (ref 1.5–6.5)
NEUT%: 1.5 % — AB (ref 39.0–75.0)
Platelets: 100 10*3/uL — ABNORMAL LOW (ref 140–400)
RBC: 4.52 10*6/uL (ref 4.20–5.82)
RDW: 16.2 % — ABNORMAL HIGH (ref 11.0–14.6)
WBC: 196.7 10*3/uL (ref 4.0–10.3)
nRBC: 0 % (ref 0–0)

## 2017-03-11 LAB — TECHNOLOGIST REVIEW

## 2017-03-11 LAB — URIC ACID: Uric Acid, Serum: 4.3 mg/dl (ref 2.6–7.4)

## 2017-03-11 LAB — LACTATE DEHYDROGENASE: LDH: 145 U/L (ref 125–245)

## 2017-03-11 NOTE — Telephone Encounter (Signed)
Gave avs and calendar for November  °

## 2017-03-22 ENCOUNTER — Other Ambulatory Visit: Payer: Self-pay | Admitting: Hematology and Oncology

## 2017-03-22 DIAGNOSIS — C9110 Chronic lymphocytic leukemia of B-cell type not having achieved remission: Secondary | ICD-10-CM

## 2017-03-22 MED FILL — IMBRUVICA 420 MG TAB: 420 | 28 days supply | Qty: 28 | Fill #0

## 2017-03-23 ENCOUNTER — Encounter: Payer: Self-pay | Admitting: Hematology and Oncology

## 2017-03-23 NOTE — Assessment & Plan Note (Signed)
66 y.o. male with diagnosis of chronic lymphocytic leukemia. Based on presence of lymphadenopathy and splenomegaly in addition to the lymphocytosis, patient is staged at [place] [facility]. Patient does have a mild thrombocytopenia, but that does not meet the diagnostic threshold to be considered stage IV. Prognostic evaluation demonstrates presence of isolated deletion 13q which places patient in good prognostic category along side negative testing for CD38.   Patient is currently receiving ibrutinib for palliative therapy for underlying leukemia.  Since initiation of medication, patient reports improved energy and appetite, but white blood cell count appears to be higher today than previously.  This is not unusual and is well described as early response to ibrutinib therapy due to lymphocyte recruitment in the peripheral blood.  As the patient is feeling better, and there has been no progression of side effects, I believe continuation of therapy at current doses is appropriate.  Plan: --Continue Ibrutinib 420mg  PO QDay --Continue Allopurinol 300mg  PO QDay for hyperuricemia prevention --RTC in my clinic on 04/02/17 for continued toxicity/treatment response monitoring

## 2017-03-23 NOTE — Progress Notes (Signed)
[facility] Follow-up Visit:  Assessment: CLL (chronic lymphocytic leukemia) ([place]) 66 y.o. male with diagnosis of chronic lymphocytic leukemia. Based on presence of lymphadenopathy and splenomegaly in addition to the lymphocytosis, patient is staged at [facility]. Patient does have a mild thrombocytopenia, but that does not meet the diagnostic threshold to be considered stage IV. Prognostic evaluation demonstrates presence of isolated deletion 13q which places patient in good prognostic category along side negative testing for CD38.   Patient is currently receiving ibrutinib for palliative therapy for underlying leukemia.  Since initiation of medication, patient reports improved energy and appetite, but white blood cell count appears to be higher today than previously.  This is not unusual and is well described as early response to ibrutinib therapy due to lymphocyte recruitment in the peripheral blood.  As the patient is feeling better, and there has been no progression of side effects, I believe continuation of therapy at current doses is appropriate.  Plan: --Continue Ibrutinib 439m PO QDay --Continue Allopurinol 3086mPO QDay for hyperuricemia prevention --RTC in my clinic on 04/02/17 for continued toxicity/treatment response monitoring Voice recognition software was used and creation of this note. Despite my best effort at editing the text, some misspelling/errors may have occurred.  Orders Placed This Encounter  Procedures  . CBC with Differential    Standing Status:   Future    Standing Expiration Date:   03/11/2018  . Comprehensive metabolic panel    Standing Status:   Future    Standing Expiration Date:   03/11/2018  . Lactate dehydrogenase (LDH)    Standing Status:   Future    Standing Expiration Date:   03/11/2018  . Uric acid    Standing Status:   Future    Standing Expiration Date:   03/11/2018    Cancer Staging CLL (chronic lymphocytic leukemia)  ([place]: Chronic Lymphocytic Leukemia / Small Lymphocytic Lymphoma, AJCC 8th Edition - Clinical stage from 12/30/2016: Modified Rai Stage II (Modified Rai risk: Intermediate, Lymphocytosis: Present, Adenopathy: Present, Organomegaly: Present, Anemia: Absent, Thrombocytopenia: Absent) - Signed by PeArdath SaxMD on 12/30/2016   All questions were answered. . The patient knows to call the clinic with any problems, questions or concerns.  This note was electronically signed.    History of Presenting Illness Wesley Mayorquins a [age].o. male followed in the [facility] diagnosis of chronic lymphocytic leukemia, Rai II, del13q-positive. Patient initially presented to his primary care provider with complaints of progressive fatigue, excessive tiredness, decreasing ability to participate in the work and daily activities for the past 4-5 months. Patient also has had a weight loss of about 15 pounds over the same period of time.   Last visit to the clinic, we have discussed potential management options including observation vs active interventions such as systemic muli-agent chemoimmunotherapy and ibrutinib. Patient returns to the clinic today after deciding to initiate therapy with ibrutinib as he is very apprehensive about chemotherapy.  Patient returns to the clinic for continued ibrutinib monitoring.  The interim, there has been no progression of the ecchymosis/petechiae over the lower extremities.  No swelling in the lower extremities.  Patient denies any interval infections, fevers, or chills.  Energy level appears to be improving as well as appetite.  Oncological/hematological History: --Labs, 12/15/16: WBC 144, ANC 2.4, ALC 141, Mono 0.2, Baso 0.6, Hgb 13.7, Plt 109; Peripheral blood smear positive for atypical lymphocytes and smudge cells; LDH 159, beta-2 microglobulin 2.7, Haptoglobin 220, IgG 725. --  Labs, 01/28/17: WBC 138, ANC 2.1, ALC 133, Hgb 13.1, Plt 104; LDH 157 --Labs,  02/25/17: WBC 210, ANC 2.5, ALC 203, Hgb 12.9, Plt 156;   Oncological/hematological History:   CLL (chronic lymphocytic leukemia) ([place])   12/15/2016 Pathology Results    pBlood Flow/CLL FISH: Peripheral blood flow cytometry positive for presence of monoclonal B cells. IHC -- positive for CD5, CD19, CD20, CD21, CD22, CD23, HLA-DR, and lambda light-chains. FISH -- positive for heterozygous del13q, negative for del 17p, del 11q, and tr 12.      12/22/2016 Imaging    CT N/C/A/P: Diffuse non-bulky lymphadenopathy in the chest, abdomen, and pelvis and no significant lymphadenopathy in the neck. Splenomegaly with spleen measuring 13.8 cm.      12/28/2016 Initial Diagnosis    CLL (chronic lymphocytic leukemia) ([place])      12/29/2016 Pathology Results    Bone Marrow Bx: The sections show a cellularity ranging from 40 to 70% focally with numerous variably sized interstitial and paratrabecular lymphoid aggregates in addition to interstitial infiltrates of primarily small lymphoid cells with high nuclear cytoplasmic ratio, round to slightly irregular nuclei, dense chromatin and small to inconspicuous nucleoli. This is admixed with scattering of prolymphocytes/paraimmunoblasts including the presence of small proliferation centers in some aggregates. The background shows trilineage hematopoiesis including abundance of megakaryocytes. The lymphoid aggregates are overwhelmingly composed of B cells as seen with CD20 and CD79a associated with CD5 co-expression. Cyclin-D1 is generally negative although there are scattered positive cells most likely correlating with the presence of prolymphocytes/paraimmunoblasts. There is an admixed very minor T cell population as highlighted with CD3. Erythroid precursors: Relatively decreased in number but with orderly and progressive maturation. Granulocytic precursors: Relatively decreased in number but with orderly and progressive maturation. Megakaryocytes: Abundant with  predominantly normal morphology. Lymphocytes/plasma cells: The lymphocytes are markedly increased in number representing 80% of all cells and mostly consist of small lymphoid cells with very high nuclear cytoplasmic ratio, round to slightly irregular nuclei, coarse chromatin and generally inconspicuous nucleoli. Large plasma cell aggregates are not present.       01/27/2017 -  Chemotherapy    Ibrutinib 479m PO QDay        Medical History: Past Medical History:  Diagnosis Date  . External hemorrhoid     Surgical History: Past Surgical History:  Procedure Laterality Date  . APPENDECTOMY    . INGUINAL HERNIA REPAIR     left  . KNEE SURGERY     Left Knee    Family History: Family History  Problem Relation Age of Onset  . Liver cancer Brother   . Cancer Brother        Liver  . Esophageal cancer Father   . Cancer Father        Throat   . Cancer Mother        Bladder - cause of death    Social History: Social History   Socioeconomic History  . Marital status: Married    Spouse name: Not on file  . Number of children: 2  . Years of education: Not on file  . Highest education level: Not on file  Social Needs  . Financial resource strain: Not on file  . Food insecurity - worry: Not on file  . Food insecurity - inability: Not on file  . Transportation needs - medical: Not on file  . Transportation needs - non-medical: Not on file  Occupational History  . [place]ation[place] [employer] [place]  Tobacco Use  . Smoking status: Current Every Day Smoker    Packs/day: 0.50  . Smokeless tobacco: Never Used  Substance and Sexual Activity  . Alcohol use: Yes    Comment: occasional  . Drug use: No  . Sexual activity: Not on file  Other Topics Concern  . Not on file  Social History Narrative  . Not on file    Allergies: No Known Allergies  Medications:  Current Outpatient Medications  Medication Sig Dispense Refill  . allopurinol (ZYLOPRIM)  300 MG tablet Take 1 tablet (300 mg total) by mouth daily. 30 tablet 3  . cholecalciferol (VITAMIN D) 1000 units tablet Take 1,000 Units by mouth daily.    Marland Kitchen co-enzyme Q-10 30 MG capsule Take 30 mg by mouth 3 (three) times daily.    . IMBRUVICA 420 MG TABS TAKE 1 TABLET BY MOUTH DAILY. TAKE WITH A GLASS OF WATER AT THE SAME TIME EACH DAY. MAINTAIN HYDRATION. 28 tablet 0  . vitamin B-12 (CYANOCOBALAMIN) 100 MCG tablet Take 100 mcg by mouth daily.     No current facility-administered medications for this visit.     Review of Systems: Review of Systems  Constitutional: Negative for appetite change, diaphoresis, fever and unexpected weight change.  HENT:   Negative for hearing loss, lump/mass, mouth sores, nosebleeds, sore throat, tinnitus, trouble swallowing and voice change.   Eyes: Negative for eye problems and icterus.  Respiratory: Negative for chest tightness, cough, hemoptysis, shortness of breath and wheezing.   Cardiovascular: Negative for chest pain, leg swelling and palpitations.  Gastrointestinal: Negative for abdominal distention, abdominal pain, blood in stool, constipation, diarrhea, nausea, rectal pain and vomiting.  Genitourinary: Negative.    Musculoskeletal: Negative.   Skin: Negative.   Neurological: Negative.   Hematological: Negative.   Psychiatric/Behavioral: Negative.      PHYSICAL EXAMINATION Blood pressure (!) 143/79, pulse 67, temperature 98.4 F (36.9 C), temperature source Oral, resp. rate 18, height '5\' 10"'  (1.778 m), weight 193 lb 12.8 oz (87.9 kg), SpO2 98 %.  ECOG PERFORMANCE STATUS: 1 - Symptomatic but completely ambulatory  Physical Exam  Constitutional: He is oriented to person, place, and time and well-developed, well-nourished, and in no distress. He appears not lethargic, not malnourished and not jaundiced. He appears not cachectic. No distress.  HENT:  Head: Normocephalic and atraumatic.  Mouth/Throat: Oropharynx is clear and moist and mucous  membranes are normal. He has dentures.  Patient has no residual natural teeth with both upper and lower dentures.  Eyes: Conjunctivae and EOM are normal. Pupils are equal, round, and reactive to light. No scleral icterus.  Neck: No JVD present. No thyroid mass and no thyromegaly present.  Cardiovascular: Normal rate, regular rhythm, S1 normal and S2 normal. Exam reveals gallop.  No murmur heard. Pulmonary/Chest: Effort normal and breath sounds normal. He has no decreased breath sounds. He has no wheezes. He has no rhonchi. He has no rales.  Abdominal:  Abdomen appears to be slightly distended, but soft, nontender and without palpable masses or hepatosplenomegaly  Lymphadenopathy:       Head (right side): No submandibular, no posterior auricular and no occipital adenopathy present.       Head (left side): No submandibular, no posterior auricular and no occipital adenopathy present.    He has no cervical adenopathy.    He has no axillary adenopathy.       Right: No inguinal and no supraclavicular adenopathy present.       Left: No inguinal and  no supraclavicular adenopathy present.  Neurological: He is alert and oriented to person, place, and time. He has normal sensation, normal strength, normal reflexes and intact cranial nerves. He appears not lethargic.  Skin: He is not diaphoretic. No pallor.     LABORATORY DATA: I have personally reviewed the data as listed: Appointment on 03/11/2017  Component Date Value Ref Range Status  . WBC 03/11/2017 196.7* 4.0 - 10.3 10e3/uL Final  . NEUT# 03/11/2017 3.2  1.5 - 6.5 10e3/uL Final  . HGB 03/11/2017 13.3  13.0 - 17.1 g/dL Final  . HCT 03/11/2017 46.5  38.4 - 49.9 % Final  . Platelets 03/11/2017 100* 140 - 400 10e3/uL Final  . MCV 03/11/2017 102.9* 79.3 - 98.0 fL Final  . MCH 03/11/2017 29.4  27.2 - 33.4 pg Final  . MCHC 03/11/2017 28.6* 32.0 - 36.0 g/dL Final  . RBC 03/11/2017 4.52  4.20 - 5.82 10e6/uL Final  . RDW 03/11/2017 16.2* 11.0 -  14.6 % Final  . lymph# 03/11/2017 189.9* 0.9 - 3.3 10e3/uL Final  . MONO# 03/11/2017 2.6* 0.1 - 0.9 10e3/uL Final  . Eosinophils Absolute 03/11/2017 0.1  0.0 - 0.5 10e3/uL Final  . Basophils Absolute 03/11/2017 1.0* 0.0 - 0.1 10e3/uL Final  . NEUT% 03/11/2017 1.5* 39.0 - 75.0 % Final  . LYMPH% 03/11/2017 96.6* 14.0 - 49.0 % Final  . MONO% 03/11/2017 1.3  0.0 - 14.0 % Final  . EOS% 03/11/2017 0.1  0.0 - 7.0 % Final  . BASO% 03/11/2017 0.5  0.0 - 2.0 % Final  . nRBC 03/11/2017 0  0 - 0 % Final  . Sodium 03/11/2017 143  136 - 145 mEq/L Final  . Potassium 03/11/2017 3.7  3.5 - 5.1 mEq/L Final  . Chloride 03/11/2017 111* 98 - 109 mEq/L Final  . CO2 03/11/2017 24  22 - 29 mEq/L Final  . Glucose 03/11/2017 102  70 - 140 mg/dl Final   Glucose reference range is for nonfasting patients. Fasting glucose reference range is 70- 100.  Marland Kitchen BUN 03/11/2017 10.7  7.0 - 26.0 mg/dL Final  . Creatinine 03/11/2017 1.3  0.7 - 1.3 mg/dL Final  . Total Bilirubin 03/11/2017 0.55  0.20 - 1.20 mg/dL Final  . Alkaline Phosphatase 03/11/2017 89  40 - 150 U/L Final  . AST 03/11/2017 12  5 - 34 U/L Final  . ALT 03/11/2017 8  0 - 55 U/L Final  . Total Protein 03/11/2017 6.5  6.4 - 8.3 g/dL Final  . Albumin 03/11/2017 3.9  3.5 - 5.0 g/dL Final  . Calcium 03/11/2017 8.9  8.4 - 10.4 mg/dL Final  . Anion Gap 03/11/2017 8  3 - 11 mEq/L Final  . EGFR 03/11/2017 57* >60 ml/min/1.73 m2 Final   eGFR is calculated using the CKD-EPI Creatinine Equation (2009)  . LDH 03/11/2017 145  125 - 245 U/L Final  . Uric Acid, Serum 03/11/2017 4.3  2.6 - 7.4 mg/dl Final  . Technologist Review 03/11/2017 Variant lymphs and smudge cells present   Final       Ardath Sax, MD

## 2017-03-30 ENCOUNTER — Other Ambulatory Visit: Payer: BLUE CROSS/BLUE SHIELD

## 2017-04-02 ENCOUNTER — Encounter: Payer: Self-pay | Admitting: Hematology and Oncology

## 2017-04-02 ENCOUNTER — Ambulatory Visit (HOSPITAL_BASED_OUTPATIENT_CLINIC_OR_DEPARTMENT_OTHER): Payer: BLUE CROSS/BLUE SHIELD | Admitting: Hematology and Oncology

## 2017-04-02 ENCOUNTER — Other Ambulatory Visit (HOSPITAL_BASED_OUTPATIENT_CLINIC_OR_DEPARTMENT_OTHER): Payer: BLUE CROSS/BLUE SHIELD

## 2017-04-02 ENCOUNTER — Telehealth: Payer: Self-pay | Admitting: Hematology and Oncology

## 2017-04-02 VITALS — BP 132/77 | HR 67 | Temp 98.4°F | Resp 17 | Ht 70.0 in | Wt 196.2 lb

## 2017-04-02 DIAGNOSIS — C9110 Chronic lymphocytic leukemia of B-cell type not having achieved remission: Secondary | ICD-10-CM

## 2017-04-02 DIAGNOSIS — Z5111 Encounter for antineoplastic chemotherapy: Secondary | ICD-10-CM

## 2017-04-02 DIAGNOSIS — Z9189 Other specified personal risk factors, not elsewhere classified: Secondary | ICD-10-CM

## 2017-04-02 LAB — COMPREHENSIVE METABOLIC PANEL
ALBUMIN: 4 g/dL (ref 3.5–5.0)
ALT: 9 U/L (ref 0–55)
ANION GAP: 9 meq/L (ref 3–11)
AST: 12 U/L (ref 5–34)
Alkaline Phosphatase: 101 U/L (ref 40–150)
BILIRUBIN TOTAL: 0.36 mg/dL (ref 0.20–1.20)
BUN: 15.2 mg/dL (ref 7.0–26.0)
CO2: 27 mEq/L (ref 22–29)
CREATININE: 1.5 mg/dL — AB (ref 0.7–1.3)
Calcium: 9 mg/dL (ref 8.4–10.4)
Chloride: 108 mEq/L (ref 98–109)
EGFR: 49 mL/min/{1.73_m2} — AB (ref >60)
GLUCOSE: 102 mg/dL (ref 70–140)
POTASSIUM: 4.7 meq/L (ref 3.5–5.1)
SODIUM: 143 meq/L (ref 136–145)
Total Protein: 6.9 g/dL (ref 6.4–8.3)

## 2017-04-02 LAB — CBC WITH DIFFERENTIAL/PLATELET
BASO%: 0.5 % (ref 0.0–2.0)
BASOS ABS: 1.1 10*3/uL — AB (ref 0.0–0.1)
EOS ABS: 0.1 10*3/uL (ref 0.0–0.5)
EOS%: 0.1 % (ref 0.0–7.0)
HCT: 45.1 % (ref 38.4–49.9)
HGB: 13.8 g/dL (ref 13.0–17.1)
LYMPH%: 96 % — ABNORMAL HIGH (ref 14.0–49.0)
MCH: 29.5 pg (ref 27.2–33.4)
MCHC: 30.6 g/dL — ABNORMAL LOW (ref 32.0–36.0)
MCV: 96.4 fL (ref 79.3–98.0)
MONO#: 2.4 10*3/uL — ABNORMAL HIGH (ref 0.1–0.9)
MONO%: 1.2 % (ref 0.0–14.0)
NEUT#: 4.6 10*3/uL (ref 1.5–6.5)
NEUT%: 2.2 % — AB (ref 39.0–75.0)
NRBC: 0 % (ref 0–0)
PLATELETS: 142 10*3/uL (ref 140–400)
RBC: 4.68 10*6/uL (ref 4.20–5.82)
RDW: 15.7 % — ABNORMAL HIGH (ref 11.0–14.6)
WBC: 203.1 10*3/uL (ref 4.0–10.3)
lymph#: 194.9 10*3/uL — ABNORMAL HIGH (ref 0.9–3.3)

## 2017-04-02 LAB — MAGNESIUM: MAGNESIUM: 2.2 mg/dL (ref 1.5–2.5)

## 2017-04-02 LAB — TECHNOLOGIST REVIEW

## 2017-04-02 LAB — URIC ACID: Uric Acid, Serum: 4.3 mg/dl (ref 2.6–7.4)

## 2017-04-02 LAB — LACTATE DEHYDROGENASE: LDH: 144 U/L (ref 125–245)

## 2017-04-02 NOTE — Telephone Encounter (Signed)
Gave patient AVS and calendar of upcoming January 2019 appointments. °

## 2017-04-03 LAB — PHOSPHORUS: Phosphorus, Ser: 3.4 mg/dL (ref 2.5–4.5)

## 2017-04-03 LAB — IGG, IGA, IGM
IGA/IMMUNOGLOBULIN A, SERUM: 125 mg/dL (ref 61–437)
IGM (IMMUNOGLOBIN M), SRM: 31 mg/dL (ref 20–172)
IgG, Qn, Serum: 807 mg/dL (ref 700–1600)

## 2017-04-19 ENCOUNTER — Other Ambulatory Visit: Payer: Self-pay | Admitting: Hematology and Oncology

## 2017-04-19 ENCOUNTER — Other Ambulatory Visit: Payer: Self-pay

## 2017-04-19 DIAGNOSIS — C9110 Chronic lymphocytic leukemia of B-cell type not having achieved remission: Secondary | ICD-10-CM

## 2017-04-20 MED FILL — IMBRUVICA 420 MG TAB: 420 | 28 days supply | Qty: 28 | Fill #0

## 2017-04-23 NOTE — Progress Notes (Signed)
[facility] Follow-up Visit:  Assessment: CLL (chronic lymphocytic leukemia) ([place]) 66 y.o. male with diagnosis of chronic lymphocytic leukemia. Based on presence of lymphadenopathy and splenomegaly in addition to the lymphocytosis, patient is staged at [facility] [facility]. Patient does have a mild thrombocytopenia, but that does not meet the diagnostic threshold to be considered stage IV. Prognostic evaluation demonstrates presence of isolated deletion 13q which places patient in good prognostic category along side negative testing for CD38.   Patient is currently receiving ibrutinib for palliative therapy for underlying leukemia.  Since initiation of medication, patient reports improved energy and appetite, but white blood cell count appears to be higher today than previously.  This is not unusual and is well described as early response to ibrutinib therapy due to lymphocyte recruitment in the peripheral blood.  As the patient is feeling better, and there has been no progression of side effects, I believe continuation of therapy at current doses is appropriate.  Plan: --Continue Ibrutinib 442m PO QDay --Continue Allopurinol 3052mPO QDay for hyperuricemia prevention --RTC 2 months for continued toxicity/treatment response monitoring  Voice recognition software was used and creation of this note. Despite my best effort at editing the text, some misspelling/errors may have occurred.  Orders Placed This Encounter  Procedures  . CBC & Diff and Retic    Standing Status:   Future    Standing Expiration Date:   04/02/2018  . Comprehensive metabolic panel    Standing Status:   Future    Standing Expiration Date:   04/02/2018  . Lactate dehydrogenase (LDH)    Standing Status:   Future    Standing Expiration Date:   04/02/2018  . QIG  (Quant. immunoglobulins  - IgG, IgA, IgM)    Standing Status:   Future    Standing Expiration Date:   04/02/2018    Cancer Staging CLL (chronic  lymphocytic leukemia) (HCMeridianStaging form: Chronic Lymphocytic Leukemia / Small Lymphocytic Lymphoma, AJCC 8th Edition - Clinical stage from 12/30/2016: Modified Rai Stage II (Modified Rai risk: Intermediate, Lymphocytosis: Present, Adenopathy: Present, Organomegaly: Present, Anemia: Absent, Thrombocytopenia: Absent) - Signed by PeArdath SaxMD on 12/30/2016   All questions were answered. . The patient knows to call the clinic with any problems, questions or concerns.  This note was electronically signed.    History of Presenting Illness Wesley Bright a [age].o. male followed in the [facility] diagnosis of chronic lymphocytic leukemia, Rai II, del13q-positive. Patient initially presented to his primary care provider with complaints of progressive fatigue, excessive tiredness, decreasing ability to participate in the work and daily activities for the past 4-5 months. Patient also has had a weight loss of about 15 pounds over the same period of time.   Last visit to the clinic, we have discussed potential management options including observation vs active interventions such as systemic muli-agent chemoimmunotherapy and ibrutinib. Patient returns to the clinic today after deciding to initiate therapy with ibrutinib as he is very apprehensive about chemotherapy.  Patient returns to the clinic for continued ibrutinib monitoring.  The interim, there has been no progression of the ecchymosis/petechiae over the lower extremities.  No swelling in the lower extremities.  Patient denies any interval infections, fevers, or chills.  Energy level appears to be improving as well as appetite.  Oncological/hematological History: --Labs, 12/15/16: WBC 144, ALC 141, Mono 0.2, Baso 0.6, Hgb 13.7, Plt 109; Peripheral blood smear positive for atypical lymphocytes and smudge cells; LDH 159, beta-2 microglobulin  2.7, Haptoglobin 220, IgG 725. --Labs, 01/28/17: WBC 138, ALC 133, Hgb 13.1, Plt 104; LDH  157 --Labs, 02/25/17: WBC 210, ALC 203, Hgb 12.9, Plt 156; --Labs, 03/11/17: WBC 197, ALC 190, Hgb 13.3, Plt 100; LDH 145 --Labs, 04/02/17: WBC 203, ALC 195, Hgb 13.8, Plt 142; LDH 144; IgG 807, IgA 125, IgM 31  Oncological/hematological History:   CLL (chronic lymphocytic leukemia) ([place])   12/15/2016 Pathology Results    pBlood Flow/CLL FISH: Peripheral blood flow cytometry positive for presence of monoclonal B cells. IHC -- positive for CD5, CD19, CD20, CD21, CD22, CD23, HLA-DR, and lambda light-chains. FISH -- positive for heterozygous del13q, negative for del 17p, del 11q, and tr 12.      12/22/2016 Imaging    CT N/C/A/P: Diffuse non-bulky lymphadenopathy in the chest, abdomen, and pelvis and no significant lymphadenopathy in the neck. Splenomegaly with spleen measuring 13.8 cm.      12/28/2016 Initial Diagnosis    CLL (chronic lymphocytic leukemia) ([place])      12/29/2016 Pathology Results    Bone Marrow Bx: The sections show a cellularity ranging from 40 to 70% focally with numerous variably sized interstitial and paratrabecular lymphoid aggregates in addition to interstitial infiltrates of primarily small lymphoid cells with high nuclear cytoplasmic ratio, round to slightly irregular nuclei, dense chromatin and small to inconspicuous nucleoli. This is admixed with scattering of prolymphocytes/paraimmunoblasts including the presence of small proliferation centers in some aggregates. The background shows trilineage hematopoiesis including abundance of megakaryocytes. The lymphoid aggregates are overwhelmingly composed of B cells as seen with CD20 and CD79a associated with CD5 co-expression. Cyclin-D1 is generally negative although there are scattered positive cells most likely correlating with the presence of prolymphocytes/paraimmunoblasts. There is an admixed very minor T cell population as highlighted with CD3. Erythroid precursors: Relatively decreased in number but with orderly and  progressive maturation. Granulocytic precursors: Relatively decreased in number but with orderly and progressive maturation. Megakaryocytes: Abundant with predominantly normal morphology. Lymphocytes/plasma cells: The lymphocytes are markedly increased in number representing 80% of all cells and mostly consist of small lymphoid cells with very high nuclear cytoplasmic ratio, round to slightly irregular nuclei, coarse chromatin and generally inconspicuous nucleoli. Large plasma cell aggregates are not present.       01/27/2017 -  Chemotherapy    Ibrutinib 460m PO QDay        Medical History: Past Medical History:  Diagnosis Date  . External hemorrhoid     Surgical History: Past Surgical History:  Procedure Laterality Date  . APPENDECTOMY    . INGUINAL HERNIA REPAIR     left  . KNEE SURGERY     Left Knee    Family History: Family History  Problem Relation Age of Onset  . Liver cancer Brother   . Cancer Brother        Liver  . Esophageal cancer Father   . Cancer Father        Throat   . Cancer Mother        Bladder - cause of death    Social History: Social History   Socioeconomic History  . Marital status: Married    Spouse name: Not on file  . Number of children: 2  . Years of education: Not on file  . Highest education level: Not on file  Social Needs  . Financial resource strain: Not on file  . Food insecurity - worry: Not on file  . Food insecurity - inability: Not on file  . Transportation  needs - medical: Not on file  . Transportation needs - non-medical: Not on file  Occupational History  . Occupation: Buyer, retail: [employer]  Tobacco Use  . Smoking status: Current Every Day Smoker    Packs/day: 0.50  . Smokeless tobacco: Never Used  Substance and Sexual Activity  . Alcohol use: Yes    Comment: occasional  . Drug use: No  . Sexual activity: Not on file  Other Topics Concern  . Not on file  Social History Narrative  . Not  on file    Allergies: No Known Allergies  Medications:  Current Outpatient Medications  Medication Sig Dispense Refill  . allopurinol (ZYLOPRIM) 300 MG tablet Take 1 tablet (300 mg total) by mouth daily. 30 tablet 3  . cholecalciferol (VITAMIN D) 1000 units tablet Take 1,000 Units by mouth daily.    Marland Kitchen co-enzyme Q-10 30 MG capsule Take 30 mg by mouth 3 (three) times daily.    . IMBRUVICA 420 MG TABS TAKE 1 TABLET BY MOUTH DAILY. TAKE WITH A GLASS OF WATER AT THE SAME TIME EACH DAY. MAINTAIN HYDRATION. 28 tablet 0  . vitamin B-12 (CYANOCOBALAMIN) 100 MCG tablet Take 100 mcg by mouth daily.     No current facility-administered medications for this visit.     Review of Systems: Review of Systems  Constitutional: Negative for appetite change, diaphoresis, fever and unexpected weight change.  HENT:   Negative for hearing loss, lump/mass, mouth sores, nosebleeds, sore throat, tinnitus, trouble swallowing and voice change.   Eyes: Negative for eye problems and icterus.  Respiratory: Negative for chest tightness, cough, hemoptysis, shortness of breath and wheezing.   Cardiovascular: Negative for chest pain, leg swelling and palpitations.  Gastrointestinal: Negative for abdominal distention, abdominal pain, blood in stool, constipation, diarrhea, nausea, rectal pain and vomiting.  Genitourinary: Negative.    Musculoskeletal: Negative.   Skin: Negative.   Neurological: Negative.   Hematological: Negative.   Psychiatric/Behavioral: Negative.      PHYSICAL EXAMINATION Blood pressure 132/77, pulse 67, temperature 98.4 F (36.9 C), temperature source Oral, resp. rate 17, height '5\' 10"'  (1.778 m), weight 196 lb 3.2 oz (89 kg), SpO2 98 %.  ECOG PERFORMANCE STATUS: 1 - Symptomatic but completely ambulatory  Physical Exam  Constitutional: He is oriented to person, place, and time and well-developed, well-nourished, and in no distress. He appears not lethargic, not malnourished and not  jaundiced. He appears not cachectic. No distress.  HENT:  Head: Normocephalic and atraumatic.  Mouth/Throat: Oropharynx is clear and moist and mucous membranes are normal. He has dentures.  Patient has no residual natural teeth with both upper and lower dentures.  Eyes: Conjunctivae and EOM are normal. Pupils are equal, round, and reactive to light. No scleral icterus.  Neck: No JVD present. No thyroid mass and no thyromegaly present.  Cardiovascular: Normal rate, regular rhythm, S1 normal and S2 normal. Exam reveals gallop.  No murmur heard. Pulmonary/Chest: Effort normal and breath sounds normal. He has no decreased breath sounds. He has no wheezes. He has no rhonchi. He has no rales.  Abdominal:  Abdomen appears to be slightly distended, but soft, nontender and without palpable masses or hepatosplenomegaly  Lymphadenopathy:       Head (right side): No submandibular, no posterior auricular and no occipital adenopathy present.       Head (left side): No submandibular, no posterior auricular and no occipital adenopathy present.    He has no cervical adenopathy.  He has no axillary adenopathy.       Right: No inguinal and no supraclavicular adenopathy present.       Left: No inguinal and no supraclavicular adenopathy present.  Neurological: He is alert and oriented to person, place, and time. He has normal sensation, normal strength, normal reflexes and intact cranial nerves. He appears not lethargic.  Skin: He is not diaphoretic. No pallor.     LABORATORY DATA: I have personally reviewed the data as listed: Appointment on 04/02/2017  Component Date Value Ref Range Status  . WBC 04/02/2017 203.1* 4.0 - 10.3 10e3/uL Final  . NEUT# 04/02/2017 4.6  1.5 - 6.5 10e3/uL Final  . HGB 04/02/2017 13.8  13.0 - 17.1 g/dL Final  . HCT 04/02/2017 45.1  38.4 - 49.9 % Final  . Platelets 04/02/2017 142  140 - 400 10e3/uL Final  . MCV 04/02/2017 96.4  79.3 - 98.0 fL Final  . MCH 04/02/2017 29.5  27.2  - 33.4 pg Final  . MCHC 04/02/2017 30.6* 32.0 - 36.0 g/dL Final  . RBC 04/02/2017 4.68  4.20 - 5.82 10e6/uL Final  . RDW 04/02/2017 15.7* 11.0 - 14.6 % Final  . lymph# 04/02/2017 194.9* 0.9 - 3.3 10e3/uL Final  . MONO# 04/02/2017 2.4* 0.1 - 0.9 10e3/uL Final  . Eosinophils Absolute 04/02/2017 0.1  0.0 - 0.5 10e3/uL Final  . Basophils Absolute 04/02/2017 1.1* 0.0 - 0.1 10e3/uL Final  . NEUT% 04/02/2017 2.2* 39.0 - 75.0 % Final  . LYMPH% 04/02/2017 96.0* 14.0 - 49.0 % Final  . MONO% 04/02/2017 1.2  0.0 - 14.0 % Final  . EOS% 04/02/2017 0.1  0.0 - 7.0 % Final  . BASO% 04/02/2017 0.5  0.0 - 2.0 % Final  . nRBC 04/02/2017 0  0 - 0 % Final  . Sodium 04/02/2017 143  136 - 145 mEq/L Final  . Potassium 04/02/2017 4.7  3.5 - 5.1 mEq/L Final  . Chloride 04/02/2017 108  98 - 109 mEq/L Final  . CO2 04/02/2017 27  22 - 29 mEq/L Final  . Glucose 04/02/2017 102  70 - 140 mg/dl Final   Glucose reference range is for nonfasting patients. Fasting glucose reference range is 70- 100.  Marland Kitchen BUN 04/02/2017 15.2  7.0 - 26.0 mg/dL Final  . Creatinine 04/02/2017 1.5* 0.7 - 1.3 mg/dL Final  . Total Bilirubin 04/02/2017 0.36  0.20 - 1.20 mg/dL Final  . Alkaline Phosphatase 04/02/2017 101  40 - 150 U/L Final  . AST 04/02/2017 12  5 - 34 U/L Final  . ALT 04/02/2017 9  0 - 55 U/L Final  . Total Protein 04/02/2017 6.9  6.4 - 8.3 g/dL Final  . Albumin 04/02/2017 4.0  3.5 - 5.0 g/dL Final  . Calcium 04/02/2017 9.0  8.4 - 10.4 mg/dL Final  . Anion Gap 04/02/2017 9  3 - 11 mEq/L Final  . EGFR 04/02/2017 49* >60 ml/min/1.73 m2 Final   eGFR is calculated using the CKD-EPI Creatinine Equation (2009)  . LDH 04/02/2017 144  125 - 245 U/L Final  . Uric Acid, Serum 04/02/2017 4.3  2.6 - 7.4 mg/dl Final  . IgG, Qn, Serum 04/02/2017 807  700 - 1,600 mg/dL Final  . IgA, Qn, Serum 04/02/2017 125  61 - 437 mg/dL Final  . IgM, Qn, Serum 04/02/2017 31  20 - 172 mg/dL Final  . Phosphorus, Ser 04/02/2017 3.4  2.5 - 4.5 mg/dL Final   . Magnesium 04/02/2017 2.2  1.5 - 2.5 mg/dl Final  .  Technologist Review 04/02/2017 Variant lymphs present and few smudge cells   Final       Ardath Sax, MD

## 2017-04-23 NOTE — Assessment & Plan Note (Signed)
66 y.o. male with diagnosis of chronic lymphocytic leukemia. Based on presence of lymphadenopathy and splenomegaly in addition to the lymphocytosis, patient is staged at [facility] [facility]. Patient does have a mild thrombocytopenia, but that does not meet the diagnostic threshold to be considered stage IV. Prognostic evaluation demonstrates presence of isolated deletion 13q which places patient in good prognostic category along side negative testing for CD38.   Patient is currently receiving ibrutinib for palliative therapy for underlying leukemia.  Since initiation of medication, patient reports improved energy and appetite, but white blood cell count appears to be higher today than previously.  This is not unusual and is well described as early response to ibrutinib therapy due to lymphocyte recruitment in the peripheral blood.  As the patient is feeling better, and there has been no progression of side effects, I believe continuation of therapy at current doses is appropriate.  Plan: --Continue Ibrutinib 420mg  PO QDay --Continue Allopurinol 300mg  PO QDay for hyperuricemia prevention --RTC 2 months for continued toxicity/treatment response monitoring

## 2017-05-10 ENCOUNTER — Other Ambulatory Visit: Payer: Self-pay | Admitting: Hematology and Oncology

## 2017-05-10 DIAGNOSIS — C9110 Chronic lymphocytic leukemia of B-cell type not having achieved remission: Secondary | ICD-10-CM

## 2017-05-19 MED FILL — IMBRUVICA 420 MG TAB: 420 | 28 days supply | Qty: 28 | Fill #0

## 2017-06-04 ENCOUNTER — Encounter: Payer: Self-pay | Admitting: Hematology and Oncology

## 2017-06-04 ENCOUNTER — Inpatient Hospital Stay (HOSPITAL_BASED_OUTPATIENT_CLINIC_OR_DEPARTMENT_OTHER): Payer: BLUE CROSS/BLUE SHIELD | Admitting: Hematology and Oncology

## 2017-06-04 ENCOUNTER — Inpatient Hospital Stay: Payer: BLUE CROSS/BLUE SHIELD | Attending: Hematology and Oncology

## 2017-06-04 ENCOUNTER — Telehealth: Payer: Self-pay | Admitting: Hematology and Oncology

## 2017-06-04 VITALS — BP 146/82 | HR 64 | Temp 98.4°F | Resp 20 | Wt 198.0 lb

## 2017-06-04 DIAGNOSIS — C9110 Chronic lymphocytic leukemia of B-cell type not having achieved remission: Secondary | ICD-10-CM | POA: Insufficient documentation

## 2017-06-04 DIAGNOSIS — Z9189 Other specified personal risk factors, not elsewhere classified: Secondary | ICD-10-CM

## 2017-06-04 DIAGNOSIS — Z5111 Encounter for antineoplastic chemotherapy: Secondary | ICD-10-CM

## 2017-06-04 DIAGNOSIS — C9190 Lymphoid leukemia, unspecified not having achieved remission: Secondary | ICD-10-CM

## 2017-06-04 LAB — COMPREHENSIVE METABOLIC PANEL
ALK PHOS: 101 U/L (ref 40–150)
ALT: 11 U/L (ref 0–55)
ANION GAP: 9 (ref 3–11)
AST: 11 U/L (ref 5–34)
Albumin: 3.8 g/dL (ref 3.5–5.0)
BUN: 15 mg/dL (ref 7–26)
CALCIUM: 8.9 mg/dL (ref 8.4–10.4)
CO2: 25 mmol/L (ref 22–29)
CREATININE: 1.53 mg/dL — AB (ref 0.70–1.30)
Chloride: 107 mmol/L (ref 98–109)
GFR calc non Af Amer: 46 mL/min — ABNORMAL LOW (ref >60)
GFR, EST AFRICAN AMERICAN: 53 mL/min — AB (ref >60)
Glucose, Bld: 73 mg/dL (ref 70–140)
Potassium: 4.4 mmol/L (ref 3.5–5.1)
SODIUM: 141 mmol/L (ref 136–145)
Total Bilirubin: 0.3 mg/dL (ref 0.2–1.2)
Total Protein: 6.7 g/dL (ref 6.4–8.3)

## 2017-06-04 LAB — CBC WITH DIFFERENTIAL (CANCER CENTER ONLY)
BASOS ABS: 0.5 10*3/uL — AB (ref 0.0–0.1)
Basophils Relative: 0 %
EOS ABS: 0.2 10*3/uL (ref 0.0–0.5)
EOS PCT: 0 %
HCT: 43.9 % (ref 38.4–49.9)
Hemoglobin: 13.8 g/dL (ref 13.0–17.1)
LYMPHS PCT: 96 %
Lymphs Abs: 137.9 10*3/uL — ABNORMAL HIGH (ref 0.9–3.3)
MCH: 29.4 pg (ref 27.2–33.4)
MCHC: 31.4 g/dL — AB (ref 32.0–36.0)
MCV: 93.6 fL (ref 79.3–98.0)
MONOS PCT: 1 %
Monocytes Absolute: 2 10*3/uL — ABNORMAL HIGH (ref 0.1–0.9)
NEUTROS PCT: 3 %
Neutro Abs: 4.5 10*3/uL (ref 1.5–6.5)
PLATELETS: 136 10*3/uL — AB (ref 140–400)
RBC: 4.69 MIL/uL (ref 4.20–5.82)
RDW: 14.9 % (ref 11.0–15.6)
WBC Count: 145 10*3/uL (ref 4.0–10.3)

## 2017-06-04 LAB — MAGNESIUM: MAGNESIUM: 2.3 mg/dL (ref 1.5–2.5)

## 2017-06-04 LAB — PHOSPHORUS: PHOSPHORUS: 3.6 mg/dL (ref 2.5–4.6)

## 2017-06-04 LAB — LACTATE DEHYDROGENASE: LDH: 145 U/L (ref 125–245)

## 2017-06-04 LAB — URIC ACID: Uric Acid, Serum: 4.2 mg/dL (ref 2.6–7.4)

## 2017-06-04 NOTE — Telephone Encounter (Signed)
Gave avs and calendar for march °

## 2017-06-05 LAB — IGG, IGA, IGM
IGA: 129 mg/dL (ref 61–437)
IGG (IMMUNOGLOBIN G), SERUM: 838 mg/dL (ref 700–1600)
IGM (IMMUNOGLOBULIN M), SRM: 33 mg/dL (ref 20–172)

## 2017-06-06 ENCOUNTER — Other Ambulatory Visit: Payer: Self-pay | Admitting: Hematology and Oncology

## 2017-06-06 DIAGNOSIS — C9110 Chronic lymphocytic leukemia of B-cell type not having achieved remission: Secondary | ICD-10-CM

## 2017-06-08 ENCOUNTER — Other Ambulatory Visit (INDEPENDENT_AMBULATORY_CARE_PROVIDER_SITE_OTHER): Payer: Self-pay

## 2017-06-08 DIAGNOSIS — C9110 Chronic lymphocytic leukemia of B-cell type not having achieved remission: Secondary | ICD-10-CM

## 2017-06-09 ENCOUNTER — Other Ambulatory Visit: Payer: Self-pay | Admitting: Hematology and Oncology

## 2017-06-09 DIAGNOSIS — C9110 Chronic lymphocytic leukemia of B-cell type not having achieved remission: Secondary | ICD-10-CM

## 2017-06-18 MED FILL — IMBRUVICA 420 MG TAB: 420 | 28 days supply | Qty: 28 | Fill #0

## 2017-06-25 NOTE — Progress Notes (Signed)
[facility] Follow-up Visit:  Assessment: CLL (chronic lymphocytic leukemia) ([place]) 67 y.o. male with diagnosis of chronic lymphocytic leukemia. Based on presence of lymphadenopathy and splenomegaly in addition to the lymphocytosis, patient is staged at [facility]. Patient does have a mild thrombocytopenia, but that does not meet the diagnostic threshold to be considered stage IV. Prognostic evaluation demonstrates presence of isolated deletion 13q which places patient in good prognostic category along side negative testing for CD38.   Patient is currently receiving ibrutinib for palliative therapy for underlying leukemia.  Lymphocyte count is starting to respond to therapy.  No significant side effects observed.  Plan: --Continue Ibrutinib 460m PO QDay --Continue Allopurinol 30106mPO QDay for hyperuricemia prevention --RTC 2 months for continued toxicity/treatment response monitoring  Voice recognition software was used and creation of this note. Despite my best effort at editing the text, some misspelling/errors may have occurred.  Orders Placed This Encounter  Procedures  . CBC with Differential (Cancer Center Only)    Standing Status:   Future    Standing Expiration Date:   06/04/2018  . Basic Metabolic Panel - [facility]    Standing Status:   Future    Standing Expiration Date:   06/04/2018  . Lactate dehydrogenase (LDH)    Standing Status:   Future    Standing Expiration Date:   06/04/2018  . Beta 2 microglobulin    Standing Status:   Future    Standing Expiration Date:   06/04/2018  . QIG  (Quant. immunoglobulins  - IgG, IgA, IgM)    Standing Status:   Future    Standing Expiration Date:   06/04/2018    Cancer Staging [place]chronic lymphocytic [place]) [place]: Chronic Lymphocytic Leukemia / Small Lymphocytic Lymphoma, AJCC 8th Edition - Clinical stage from 12/30/2016: Modified Rai Stage II (Modified Rai risk: Intermediate, Lymphocytosis:  Present, Adenopathy: Present, Organomegaly: Present, Anemia: Absent, Thrombocytopenia: Absent) - Signed by PeArdath SaxMD on 12/30/2016   All questions were answered. . The patient knows to call the clinic with any problems, questions or concerns.  This note was electronically signed.    History of Presenting Illness Wesley Bright a [age].o. male [place] [facility] [place] [facility] [place] of chronic lymphocytic leukemia, Rai II, del13q-positive. Patient initially presented to his primary care provider with complaints of progressive fatigue, excessive tiredness, decreasing ability to participate in the work and daily activities for the past 4-5 months. Patient also has had a weight loss of about 15 pounds over the same period of time.   Last visit to the clinic, we have discussed potential management options including observation vs active interventions such as systemic muli-agent chemoimmunotherapy and ibrutinib. Patient returns to the clinic today after deciding to initiate therapy with ibrutinib as he is very apprehensive about chemotherapy.  Patient returns to the clinic for continued ibrutinib monitoring.  No new complaints since last visit to the clinic.  Energy improvement is persisting.  Oncological/hematological History: --Labs, 12/15/16: WBC 144, ALC 141, Mono 0.2, Baso 0.6, Hgb 13.7, Plt 109; Peripheral blood smear positive for atypical lymphocytes and smudge cells; LDH 159, beta-2 microglobulin 2.7, Haptoglobin 220, IgG 725. --Labs, 01/28/17: WBC 138, ALC 133, Hgb 13.1, Plt 104; LDH 157 --Labs, 02/25/17: WBC 210, ALC 203, Hgb 12.9, Plt 156; --Labs, 03/11/17: WBC 197, ALC 190, Hgb 13.3, Plt 100; LDH 145 --Labs, 04/02/17: WBC 203, ALC 195, Hgb 13.8, Plt 142; LDH 144; IgG 807, IgA 125, IgM 31 --  Labs, 06/04/17: WBC 145, ALC 138, Hgb 13.8, Plt 136; LDH 145; IgG 838, IgA 129, IgM 33  Oncological/hematological History:   CLL (chronic lymphocytic leukemia) ([place])   12/15/2016  Pathology Results    pBlood Flow/CLL FISH: Peripheral blood flow cytometry positive for presence of monoclonal B cells. IHC -- positive for CD5, CD19, CD20, CD21, CD22, CD23, HLA-DR, and lambda light-chains. FISH -- positive for heterozygous del13q, negative for del 17p, del 11q, and tr 12.      12/22/2016 Imaging    CT N/C/A/P: Diffuse non-bulky lymphadenopathy in the chest, abdomen, and pelvis and no significant lymphadenopathy in the neck. Splenomegaly with spleen measuring 13.8 cm.      12/28/2016 Initial Diagnosis    CLL (chronic lymphocytic leukemia) ([place])      12/29/2016 Pathology Results    Bone Marrow Bx: The sections show a cellularity ranging from 40 to 70% focally with numerous variably sized interstitial and paratrabecular lymphoid aggregates in addition to interstitial infiltrates of primarily small lymphoid cells with high nuclear cytoplasmic ratio, round to slightly irregular nuclei, dense chromatin and small to inconspicuous nucleoli. This is admixed with scattering of prolymphocytes/paraimmunoblasts including the presence of small proliferation centers in some aggregates. The background shows trilineage hematopoiesis including abundance of megakaryocytes. The lymphoid aggregates are overwhelmingly composed of B cells as seen with CD20 and CD79a associated with CD5 co-expression. Cyclin-D1 is generally negative although there are scattered positive cells most likely correlating with the presence of prolymphocytes/paraimmunoblasts. There is an admixed very minor T cell population as highlighted with CD3. Erythroid precursors: Relatively decreased in number but with orderly and progressive maturation. Granulocytic precursors: Relatively decreased in number but with orderly and progressive maturation. Megakaryocytes: Abundant with predominantly normal morphology. Lymphocytes/plasma cells: The lymphocytes are markedly increased in number representing 80% of all cells and mostly consist of small  lymphoid cells with very high nuclear cytoplasmic ratio, round to slightly irregular nuclei, coarse chromatin and generally inconspicuous nucleoli. Large plasma cell aggregates are not present.       01/27/2017 -  Chemotherapy    Ibrutinib 453m PO QDay        Medical History: Past Medical History:  Diagnosis Date  . External hemorrhoid     Surgical History: Past Surgical History:  Procedure Laterality Date  . APPENDECTOMY    . INGUINAL HERNIA REPAIR     left  . KNEE SURGERY     Left Knee    Family History: Family History  Problem Relation Age of Onset  . Liver cancer Brother   . Cancer Brother        Liver  . Esophageal cancer Father   . Cancer Father        Throat   . Cancer Mother        Bladder - cause of death    Social History: Social History   Socioeconomic History  . Marital status: Married    Spouse name: Not on file  . Number of children: 2  . Years of education: Not on file  . Highest education level: Not on file  Social Needs  . Financial resource strain: Not on file  . Food insecurity - worry: Not on file  . Food insecurity - inability: Not on file  . Transportation needs - medical: Not on file  . Transportation needs - non-medical: Not on file  Occupational History  . [place] Tobacco Use  . Smoking status: Current Every Day  Smoker    Packs/day: 0.50  . Smokeless tobacco: Never Used  Substance and Sexual Activity  . Alcohol use: Yes    Comment: occasional  . Drug use: No  . Sexual activity: Not on file  Other Topics Concern  . Not on file  Social History Narrative  . Not on file    Allergies: No Known Allergies  Medications:  Current Outpatient Medications  Medication Sig Dispense Refill  . cholecalciferol (VITAMIN D) 1000 units tablet Take 1,000 Units by mouth daily.    Marland Kitchen co-enzyme Q-10 30 MG capsule Take 30 mg by mouth 3 (three) times daily.    . vitamin B-12  (CYANOCOBALAMIN) 100 MCG tablet Take 100 mcg by mouth daily.    Marland Kitchen allopurinol (ZYLOPRIM) 300 MG tablet TAKE 1 TABLET(300 MG) BY MOUTH DAILY 30 tablet 0  . IMBRUVICA 420 MG TABS TAKE 1 TABLET BY MOUTH DAILY. TAKE WITH A GLASS OF WATER AT THE SAME TIME EACH DAY. MAINTAIN HYDRATION. 28 tablet 0   No current facility-administered medications for this visit.     Review of Systems: Review of Systems  Constitutional: Negative for appetite change, diaphoresis, fever and unexpected weight change.  HENT:   Negative for hearing loss, lump/mass, mouth sores, nosebleeds, sore throat, tinnitus, trouble swallowing and voice change.   Eyes: Negative for eye problems and icterus.  Respiratory: Negative for chest tightness, cough, hemoptysis, shortness of breath and wheezing.   Cardiovascular: Negative for chest pain, leg swelling and palpitations.  Gastrointestinal: Negative for abdominal distention, abdominal pain, blood in stool, constipation, diarrhea, nausea, rectal pain and vomiting.  Genitourinary: Negative.    Musculoskeletal: Negative.   Skin: Negative.   Neurological: Negative.   Hematological: Negative.   Psychiatric/Behavioral: Negative.      PHYSICAL EXAMINATION Blood pressure (!) 146/82, pulse 64, temperature 98.4 F (36.9 C), temperature source Oral, resp. rate 20, weight 198 lb (89.8 kg), SpO2 98 %.  ECOG PERFORMANCE STATUS: 1 - Symptomatic but completely ambulatory  Physical Exam  Constitutional: He is oriented to person, place, and time and well-developed, well-nourished, and in no distress. He appears not lethargic, not malnourished and not jaundiced. He appears not cachectic. No distress.  HENT:  Head: Normocephalic and atraumatic.  Mouth/Throat: Oropharynx is clear and moist and mucous membranes are normal. He has dentures.  Patient has no residual natural teeth with both upper and lower dentures.  Eyes: Conjunctivae and EOM are normal. Pupils are equal, round, and reactive to  light. No scleral icterus.  Neck: No JVD present. No thyroid mass and no thyromegaly present.  Cardiovascular: Normal rate, regular rhythm, S1 normal and S2 normal. Exam reveals gallop.  No murmur heard. Pulmonary/Chest: Effort normal and breath sounds normal. He has no decreased breath sounds. He has no wheezes. He has no rhonchi. He has no rales.  Abdominal:  Abdomen appears to be slightly distended, but soft, nontender and without palpable masses or hepatosplenomegaly  Lymphadenopathy:       Head (right side): No submandibular, no posterior auricular and no occipital adenopathy present.       Head (left side): No submandibular, no posterior auricular and no occipital adenopathy present.    He has no cervical adenopathy.    He has no axillary adenopathy.       Right: No inguinal and no supraclavicular adenopathy present.       Left: No inguinal and no supraclavicular adenopathy present.  Neurological: He is alert and oriented to person, place, and time. He has  normal sensation, normal strength, normal reflexes and intact cranial nerves. He appears not lethargic.  Skin: He is not diaphoretic. No pallor.     LABORATORY DATA: I have personally reviewed the data as listed: Appointment on 06/04/2017  Component Date Value Ref Range Status  . Sodium 06/04/2017 141  136 - 145 mmol/L Final  . Potassium 06/04/2017 4.4  3.5 - 5.1 mmol/L Final  . Chloride 06/04/2017 107  98 - 109 mmol/L Final  . CO2 06/04/2017 25  22 - 29 mmol/L Final  . Glucose, Bld 06/04/2017 73  70 - 140 mg/dL Final  . BUN 06/04/2017 15  7 - 26 mg/dL Final  . Creatinine, Ser 06/04/2017 1.53* 0.70 - 1.30 mg/dL Final  . Calcium 06/04/2017 8.9  8.4 - 10.4 mg/dL Final  . Total Protein 06/04/2017 6.7  6.4 - 8.3 g/dL Final  . Albumin 06/04/2017 3.8  3.5 - 5.0 g/dL Final  . AST 06/04/2017 11  5 - 34 U/L Final  . ALT 06/04/2017 11  0 - 55 U/L Final  . Alkaline Phosphatase 06/04/2017 101  40 - 150 U/L Final  . Total Bilirubin  06/04/2017 0.3  0.2 - 1.2 mg/dL Final  . GFR calc non Af Amer 06/04/2017 46* >60 mL/min Final  . GFR calc Af Amer 06/04/2017 53* >60 mL/min Final   Comment: (NOTE) The eGFR has been calculated using the CKD EPI equation. This calculation has not been validated in all clinical situations. eGFR's persistently <60 mL/min signify possible Chronic Kidney Disease.   Georgiann Hahn gap 06/04/2017 9  3 - 11 Final   Performed at [facility], [place] [address] [place], [place] [address]  . LDH 06/04/2017 145  125 - 245 U/L Final   Performed at [facility], [place] [address], [place], [place] [address]  . IgG (Immunoglobin G), Serum 06/04/2017 838  700 - 1,600 mg/dL Final  . IgA 06/04/2017 129  61 - 437 mg/dL Final  . IgM (Immunoglobulin M), Srm 06/04/2017 33  20 - 172 mg/dL Final   Comment: (NOTE) Performed At: [facility] [place], [place] [id number] Rush Farmer MD [id number] Performed at [facility], [place] [address], [place], [place] [address]   . Magnesium 06/04/2017 2.3  1.5 - 2.5 mg/dL Final   Performed at [facility], [place] [address], [place], [place] [address]  . Phosphorus 06/04/2017 3.6  2.5 - 4.6 mg/dL Final   Performed at [facility], [place] [address], [place], [place] [address]  . Uric Acid, Serum 06/04/2017 4.2  2.6 - 7.4 mg/dL Final   Performed at [facility], [place] [address], [place], [place] [address]  . WBC Count 06/04/2017 145.0* 4.0 - 10.3 K/uL Final   Successful Call: [facility] called 06/04/2017 04:36 PM to [place] ([phone]/Kimie Pidcock G) by [phone]. Read Back: Yes Comments: CRITICAL RESULT CALLED TO, READ BACK BY AND VERIFIED WITH: DR Nickolus Wadding   . RBC 06/04/2017 4.69  4.20 - 5.82 MIL/uL Final  . Hemoglobin 06/04/2017 13.8  13.0 - 17.1 g/dL Final  . HCT 06/04/2017 43.9  38.4 - 49.9 % Final  . MCV  06/04/2017 93.6  79.3 - 98.0 fL Final  . MCH 06/04/2017 29.4  27.2 - 33.4 pg Final  . MCHC 06/04/2017 31.4* 32.0 - 36.0 g/dL Final  . RDW 06/04/2017 14.9  11.0 - 15.6 % Final  . Platelet Count 06/04/2017 136* 140 -  400 K/uL Final  . Neutro Abs 06/04/2017 4.5  1.5 - 6.5 K/uL Final  . Lymphs Abs 06/04/2017 137.9* 0.9 - 3.3 K/uL Final  . Monocytes Absolute 06/04/2017 2.0* 0.1 - 0.9 K/uL Final  . Eosinophils Absolute 06/04/2017 0.2  0.0 - 0.5 K/uL Final  . Basophils Absolute 06/04/2017 0.5* 0.0 - 0.1 K/uL Final  . Neutrophils Relative % 06/04/2017 3  % Final  . Lymphocytes Relative 06/04/2017 96  % Final  . Monocytes Relative 06/04/2017 1  % Final  . Eosinophils Relative 06/04/2017 0  % Final  . Basophils Relative 06/04/2017 0  % Final  . WBC Morphology 06/04/2017 VARIANT LYMPHS AND SMUDGE CELLS   Final   Performed at [facility], [place] [address], [place], [place] [address]       Ardath Sax, MD

## 2017-06-25 NOTE — Assessment & Plan Note (Signed)
67 y.o. male with diagnosis of chronic lymphocytic leukemia. Based on presence of lymphadenopathy and splenomegaly in addition to the lymphocytosis, patient is staged at [place] [facility]. Patient does have a mild thrombocytopenia, but that does not meet the diagnostic threshold to be considered stage IV. Prognostic evaluation demonstrates presence of isolated deletion 13q which places patient in good prognostic category along side negative testing for CD38.   Patient is currently receiving ibrutinib for palliative therapy for underlying leukemia.  Lymphocyte count is starting to respond to therapy.  No significant side effects observed.  Plan: --Continue Ibrutinib 420mg  PO QDay --Continue Allopurinol 300mg  PO QDay for hyperuricemia prevention --RTC 2 months for continued toxicity/treatment response monitoring

## 2017-07-05 ENCOUNTER — Other Ambulatory Visit: Payer: Self-pay | Admitting: Hematology and Oncology

## 2017-07-05 DIAGNOSIS — C9110 Chronic lymphocytic leukemia of B-cell type not having achieved remission: Secondary | ICD-10-CM

## 2017-07-09 ENCOUNTER — Other Ambulatory Visit: Payer: Self-pay | Admitting: Hematology and Oncology

## 2017-07-09 DIAGNOSIS — C9110 Chronic lymphocytic leukemia of B-cell type not having achieved remission: Secondary | ICD-10-CM

## 2017-07-15 MED FILL — IMBRUVICA 420 MG TAB: 420 | 28 days supply | Qty: 28 | Fill #0

## 2017-07-29 ENCOUNTER — Inpatient Hospital Stay: Payer: BLUE CROSS/BLUE SHIELD | Attending: Hematology and Oncology

## 2017-07-29 DIAGNOSIS — C9110 Chronic lymphocytic leukemia of B-cell type not having achieved remission: Secondary | ICD-10-CM

## 2017-07-29 DIAGNOSIS — Z9189 Other specified personal risk factors, not elsewhere classified: Secondary | ICD-10-CM

## 2017-07-29 LAB — CBC WITH DIFFERENTIAL (CANCER CENTER ONLY)
BASOS ABS: 0 10*3/uL (ref 0.0–0.1)
Basophils Relative: 0 %
EOS ABS: 0.2 10*3/uL (ref 0.0–0.5)
Eosinophils Relative: 0 %
HCT: 41 % (ref 38.4–49.9)
HEMOGLOBIN: 13.1 g/dL (ref 13.0–17.1)
LYMPHS ABS: 75.5 10*3/uL — AB (ref 0.9–3.3)
Lymphocytes Relative: 91 %
MCH: 29.3 pg (ref 27.2–33.4)
MCHC: 32 g/dL (ref 32.0–36.0)
MCV: 91.6 fL (ref 79.3–98.0)
Monocytes Absolute: 1.8 10*3/uL — ABNORMAL HIGH (ref 0.1–0.9)
Monocytes Relative: 2 %
NEUTROS PCT: 7 %
Neutro Abs: 5.4 10*3/uL (ref 1.5–6.5)
Platelet Count: 134 10*3/uL — ABNORMAL LOW (ref 140–400)
RBC: 4.48 MIL/uL (ref 4.20–5.82)
RDW: 15.2 % — ABNORMAL HIGH (ref 11.0–14.6)
WBC Count: 83 10*3/uL (ref 4.0–10.3)

## 2017-07-29 LAB — LACTATE DEHYDROGENASE: LDH: 144 U/L (ref 125–245)

## 2017-07-29 LAB — BASIC METABOLIC PANEL - CANCER CENTER ONLY
Anion gap: 7 (ref 3–11)
BUN: 14 mg/dL (ref 7–26)
CHLORIDE: 109 mmol/L (ref 98–109)
CO2: 24 mmol/L (ref 22–29)
Calcium: 9 mg/dL (ref 8.4–10.4)
Creatinine: 1.51 mg/dL — ABNORMAL HIGH (ref 0.70–1.30)
GFR, EST AFRICAN AMERICAN: 54 mL/min — AB (ref >60)
GFR, Estimated: 47 mL/min — ABNORMAL LOW (ref >60)
Glucose, Bld: 85 mg/dL (ref 70–140)
Potassium: 3.8 mmol/L (ref 3.5–5.1)
Sodium: 140 mmol/L (ref 136–145)

## 2017-07-29 LAB — URIC ACID: Uric Acid, Serum: 4.2 mg/dL (ref 2.6–7.4)

## 2017-07-29 LAB — PHOSPHORUS: PHOSPHORUS: 3.5 mg/dL (ref 2.5–4.6)

## 2017-07-30 LAB — IGG, IGA, IGM
IGA: 122 mg/dL (ref 61–437)
IGG (IMMUNOGLOBIN G), SERUM: 819 mg/dL (ref 700–1600)
IgM (Immunoglobulin M), Srm: 35 mg/dL (ref 20–172)

## 2017-07-30 LAB — BETA 2 MICROGLOBULIN, SERUM: BETA 2 MICROGLOBULIN: 2.5 mg/L — AB (ref 0.6–2.4)

## 2017-08-02 ENCOUNTER — Inpatient Hospital Stay (HOSPITAL_BASED_OUTPATIENT_CLINIC_OR_DEPARTMENT_OTHER): Payer: BLUE CROSS/BLUE SHIELD | Admitting: Hematology and Oncology

## 2017-08-02 ENCOUNTER — Encounter: Payer: Self-pay | Admitting: Hematology and Oncology

## 2017-08-02 ENCOUNTER — Telehealth: Payer: Self-pay | Admitting: Hematology and Oncology

## 2017-08-02 VITALS — BP 160/74 | HR 68 | Temp 97.9°F | Resp 17 | Wt 198.5 lb

## 2017-08-02 DIAGNOSIS — C9110 Chronic lymphocytic leukemia of B-cell type not having achieved remission: Secondary | ICD-10-CM | POA: Diagnosis not present

## 2017-08-02 DIAGNOSIS — Z5111 Encounter for antineoplastic chemotherapy: Secondary | ICD-10-CM | POA: Diagnosis not present

## 2017-08-02 NOTE — Telephone Encounter (Signed)
Appointments scheduled AVS/Calendar printed per 3/18 los °

## 2017-08-09 ENCOUNTER — Encounter: Payer: Self-pay | Admitting: Hematology and Oncology

## 2017-08-09 ENCOUNTER — Other Ambulatory Visit: Payer: Self-pay | Admitting: Hematology and Oncology

## 2017-08-09 DIAGNOSIS — C9110 Chronic lymphocytic leukemia of B-cell type not having achieved remission: Secondary | ICD-10-CM

## 2017-08-09 NOTE — Progress Notes (Signed)
[facility] Follow-up Visit:  Assessment: CLL (chronic lymphocytic leukemia) ([place]) 66 y.o. male with diagnosis of chronic lymphocytic leukemia. Based on presence of lymphadenopathy and splenomegaly in addition to the lymphocytosis, patient is staged at [place] II. Patient does have a mild thrombocytopenia, but that does not meet the diagnostic threshold to be considered stage IV. Prognostic evaluation demonstrates presence of isolated deletion 13q which places patient in good prognostic category along side negative testing for CD38.   Patient is currently receiving ibrutinib for palliative therapy for underlying leukemia.  Progressive improvement of the total white blood cell count and absolute lymphocyte count consistent with excellent disease response.  No progressive gammopathy.  Patient is tolerating medication very well without any recurrent bruising or lower extremity swelling.  Plan: --Continue Ibrutinib 472m PO QDay --Continue Allopurinol 3058mPO QDay for hyperuricemia prevention --RTC 2 months for continued toxicity/treatment response monitoring.  We will obtain additional lab work and EKG for toxicity monitoring during the visit.  Voice recognition software was used and creation of this note. Despite my best effort at editing the text, some misspelling/errors may have occurred.  Orders Placed This Encounter  Procedures  . CBC with Differential (Cancer Center Only)    Standing Status:   Future    Standing Expiration Date:   08/03/2018  . CMP ([facility])    Standing Status:   Future    Standing Expiration Date:   08/03/2018  . Magnesium    Standing Status:   Future    Standing Expiration Date:   08/02/2018  . EKG 12-Lead    Standing Status:   Future    Standing Expiration Date:   08/03/2018    Cancer Staging CLL (chronic lymphocytic leukemia) ([facility] form: Chronic Lymphocytic Leukemia / Small Lymphocytic Lymphoma, AJCC 8th Edition - Clinical  stage from 12/30/2016: Modified Rai Stage II (Modified Rai risk: Intermediate, Lymphocytosis: Present, Adenopathy: Present, Organomegaly: Present, Anemia: Absent, Thrombocytopenia: Absent) - Signed by PeArdath SaxMD on 12/30/2016   All questions were answered. . The patient knows to call the clinic with any problems, questions or concerns.  This note was electronically signed.    History of Presenting Illness Wesley Schonemans a [age].o. male [facility] in the [facility] diagnosis of chronic lymphocytic leukemia, Rai II, del13q-positive. Patient initially presented to his primary care provider with complaints of progressive fatigue, excessive tiredness, decreasing ability to participate in the work and daily activities for the past 4-5 months. Patient also has had a weight loss of about 15 pounds over the same period of time.   Last visit to the clinic, we have discussed potential management options including observation vs active interventions such as systemic muli-agent chemoimmunotherapy and ibrutinib. Patient returns to the clinic today after deciding to initiate therapy with ibrutinib as he is very apprehensive about chemotherapy.  Patient returns to the clinic for continued ibrutinib monitoring.  No new complaints since last visit to the clinic.  Energy improvement is persisting.   Oncological/hematological History: --Labs, 12/15/16: WBC  144, ALC  141, Mono 0.2, Baso 0.6, Hgb 13.7, Plt 109; Peripheral blood smear positive for atypical lymphocytes and smudge cells; LDH 159, beta-2 microglobulin 2.7, Haptoglobin 220, IgG 725. --Labs, 01/28/17: WBC  138, ALC  133, Hgb 13.1, Plt 104; LDH 157 --Labs, 02/25/17: WBC  210, ALC  203, Hgb 12.9, Plt 156; --Labs, 03/11/17: WBC  197, ALC  190, Hgb 13.3, Plt 100; LDH 145 --Labs, 04/02/17: WBC  203,  ALC  195, Hgb 13.8, Plt 142; LDH 144;     IgG 807, IgA 125, IgM 31 --Labs, 06/04/17: WBC  145, ALC  138, Hgb 13.8, Plt 136; LDH 145;     IgG 838, IgA  129, IgM 33 --Labs, 08/02/17: WBC 83.0, ALC 75.5, Hgb 13.1, Plt 134; LDH 144, beta-2 microglobulin 2.5;  IgG 819   Oncological/hematological History:   CLL (chronic lymphocytic leukemia) ([place])   12/15/2016 Pathology Results    pBlood Flow/CLL FISH: Peripheral blood flow cytometry positive for presence of monoclonal B cells. IHC -- positive for CD5, CD19, CD20, CD21, CD22, CD23, HLA-DR, and lambda light-chains. FISH -- positive for heterozygous del13q, negative for del 17p, del 11q, and tr 12.      12/22/2016 Imaging    CT N/C/A/P: Diffuse non-bulky lymphadenopathy in the chest, abdomen, and pelvis and no significant lymphadenopathy in the neck. Splenomegaly with spleen measuring 13.8 cm.      12/28/2016 Initial Diagnosis    CLL (chronic lymphocytic leukemia) ([place])      12/29/2016 Pathology Results    Bone Marrow Bx: The sections show a cellularity ranging from 40 to 70% focally with numerous variably sized interstitial and paratrabecular lymphoid aggregates in addition to interstitial infiltrates of primarily small lymphoid cells with high nuclear cytoplasmic ratio, round to slightly irregular nuclei, dense chromatin and small to inconspicuous nucleoli. This is admixed with scattering of prolymphocytes/paraimmunoblasts including the presence of small proliferation centers in some aggregates. The background shows trilineage hematopoiesis including abundance of megakaryocytes. The lymphoid aggregates are overwhelmingly composed of B cells as seen with CD20 and CD79a associated with CD5 co-expression. Cyclin-D1 is generally negative although there are scattered positive cells most likely correlating with the presence of prolymphocytes/paraimmunoblasts. There is an admixed very minor T cell population as highlighted with CD3. Erythroid precursors: Relatively decreased in number but with orderly and progressive maturation. Granulocytic precursors: Relatively decreased in number but with orderly and  progressive maturation. Megakaryocytes: Abundant with predominantly normal morphology. Lymphocytes/plasma cells: The lymphocytes are markedly increased in number representing 80% of all cells and mostly consist of small lymphoid cells with very high nuclear cytoplasmic ratio, round to slightly irregular nuclei, coarse chromatin and generally inconspicuous nucleoli. Large plasma cell aggregates are not present.       01/27/2017 -  Chemotherapy    Ibrutinib 437m PO QDay        Medical History: Past Medical History:  Diagnosis Date  . External hemorrhoid     Surgical History: Past Surgical History:  Procedure Laterality Date  . APPENDECTOMY    . INGUINAL HERNIA REPAIR     left  . KNEE SURGERY     Left Knee    Family History: Family History  Problem Relation Age of Onset  . Liver cancer Brother   . Cancer Brother        Liver  . Esophageal cancer Father   . Cancer Father        Throat   . Cancer Mother        Bladder - cause of death    Social History: Social History   Socioeconomic History  . Marital status: Married    Spouse name: Not on file  . Number of children: 2  . Years of education: Not on file  . Highest education level: Not on file  Occupational History  . Occupation: [employer], [employer] . Financial resource strain: Not on file  .  Food insecurity:    Worry: Not on file    Inability: Not on file  . Transportation needs:    Medical: Not on file    Non-medical: Not on file  Tobacco Use  . Smoking status: Current Every Day Smoker    Packs/day: 0.50  . Smokeless tobacco: Never Used  Substance and Sexual Activity  . Alcohol use: Yes    Comment: occasional  . Drug use: No  . Sexual activity: Not on file  Lifestyle  . Physical activity:    Days per week: Not on file    Minutes per session: Not on file  . Stress: Not on file  Relationships  . Social connections:    Talks on phone: Not on file    Gets  together: Not on file    Attends religious service: Not on file    Active member of club or organization: Not on file    Attends meetings of clubs or organizations: Not on file    Relationship status: Not on file  . Intimate partner violence:    Fear of current or ex partner: Not on file    Emotionally abused: Not on file    Physically abused: Not on file    Forced sexual activity: Not on file  Other Topics Concern  . Not on file  Social History Narrative  . Not on file    Allergies: No Known Allergies  Medications:  Current Outpatient Medications  Medication Sig Dispense Refill  . allopurinol (ZYLOPRIM) 300 MG tablet TAKE 1 TABLET(300 MG) BY MOUTH DAILY 30 tablet 0  . cholecalciferol (VITAMIN D) 1000 units tablet Take 1,000 Units by mouth daily.    Marland Kitchen co-enzyme Q-10 30 MG capsule Take 30 mg by mouth 3 (three) times daily.    . IMBRUVICA 420 MG TABS TAKE 1 TABLET BY MOUTH DAILY. TAKE WITH A GLASS OF WATER AT THE SAME TIME EACH DAY. MAINTAIN HYDRATION. 28 tablet 0  . vitamin B-12 (CYANOCOBALAMIN) 100 MCG tablet Take 100 mcg by mouth daily.     No current facility-administered medications for this visit.     Review of Systems: Review of Systems  Constitutional: Negative for appetite change, diaphoresis, fever and unexpected weight change.  HENT:   Negative for hearing loss, lump/mass, mouth sores, nosebleeds, sore throat, tinnitus, trouble swallowing and voice change.   Eyes: Negative for eye problems and icterus.  Respiratory: Negative for chest tightness, cough, hemoptysis, shortness of breath and wheezing.   Cardiovascular: Negative for chest pain, leg swelling and palpitations.  Gastrointestinal: Negative for abdominal distention, abdominal pain, blood in stool, constipation, diarrhea, nausea, rectal pain and vomiting.  Genitourinary: Negative.    Musculoskeletal: Negative.   Skin: Negative.   Neurological: Negative.   Hematological: Negative.   Psychiatric/Behavioral:  Negative.      PHYSICAL EXAMINATION Blood pressure (!) 160/74, pulse 68, temperature 97.9 F (36.6 C), temperature source Oral, resp. rate 17, weight 198 lb 8 oz (90 kg), SpO2 99 %.  ECOG PERFORMANCE STATUS: 1 - Symptomatic but completely ambulatory  Physical Exam  Constitutional: He is oriented to person, place, and time and well-developed, well-nourished, and in no distress. He appears not lethargic, not malnourished and not jaundiced. He appears not cachectic. No distress.  HENT:  Head: Normocephalic and atraumatic.  Mouth/Throat: Oropharynx is clear and moist and mucous membranes are normal. He has dentures.  Patient has no residual natural teeth with both upper and lower dentures.  Eyes: Pupils are equal, round, and  reactive to light. Conjunctivae and EOM are normal. No scleral icterus.  Neck: No JVD present. No thyroid mass and no thyromegaly present.  Cardiovascular: Normal rate, regular rhythm, S1 normal and S2 normal. Exam reveals gallop.  No murmur heard. Pulmonary/Chest: Effort normal and breath sounds normal. He has no decreased breath sounds. He has no wheezes. He has no rhonchi. He has no rales.  Abdominal:  Abdomen appears to be slightly distended, but soft, nontender and without palpable masses or hepatosplenomegaly  Lymphadenopathy:       Head (right side): No submandibular, no posterior auricular and no occipital adenopathy present.       Head (left side): No submandibular, no posterior auricular and no occipital adenopathy present.    He has no cervical adenopathy.    He has no axillary adenopathy.       Right: No inguinal and no supraclavicular adenopathy present.       Left: No inguinal and no supraclavicular adenopathy present.  Neurological: He is alert and oriented to person, place, and time. He has normal sensation, normal strength, normal reflexes and intact cranial nerves. He appears not lethargic.  Skin: He is not diaphoretic. No pallor.     LABORATORY  DATA: I have personally reviewed the data as listed: Appointment on 07/29/2017  Component Date Value Ref Range Status  . IgG (Immunoglobin G), Serum 07/29/2017 819  700 - 1,600 mg/dL Final  . IgA 07/29/2017 122  61 - 437 mg/dL Final  . IgM (Immunoglobulin M), Srm 07/29/2017 35  20 - 172 mg/dL Final   Comment: (NOTE) Performed At: [facility] [place], [place] [id number] Rush Farmer MD [id number] Performed at [facility], [place] [address] [place], [place] [address]   . Beta-2 Microglobulin 07/29/2017 2.5* 0.6 - 2.4 mg/L Final   Comment: (NOTE) Siemens Immulite 2000 Immunochemiluminometric assay (ICMA) Values obtained with different assay methods or kits cannot be used interchangeably. Results cannot be interpreted as absolute evidence of the presence or absence of malignant disease. Performed At: [facility] [place], [place] [id number] Rush Farmer MD [id number] Performed at [facility], [place] [address], [place], [place] [address]   . LDH 07/29/2017 144  125 - 245 U/L Final   Performed at [facility], [place] [address], [place], [place] [address]  . Sodium 07/29/2017 140  136 - 145 mmol/L Final  . Potassium 07/29/2017 3.8  3.5 - 5.1 mmol/L Final  . Chloride 07/29/2017 109  98 - 109 mmol/L Final  . CO2 07/29/2017 24  22 - 29 mmol/L Final  . Glucose, Bld 07/29/2017 85  70 - 140 mg/dL Final  . BUN 07/29/2017 14  7 - 26 mg/dL Final  . Creatinine 07/29/2017 1.51* 0.70 - 1.30 mg/dL Final  . Calcium 07/29/2017 9.0  8.4 - 10.4 mg/dL Final  . GFR, Est Non Af Am 07/29/2017 47* >60 mL/min Final  . GFR, Est AFR Am 07/29/2017 54* >60 mL/min Final   Comment: (NOTE) The eGFR has been calculated using the CKD EPI equation. This calculation has not been validated in all clinical situations. eGFR's persistently <60 mL/min signify possible Chronic Kidney Disease.    Georgiann Hahn gap 07/29/2017 7  3 - 11 Final   Performed at [facility], [place] [address] [place], [place] [address]  . WBC Count 07/29/2017 83.0* 4.0 - 10.3 K/uL Final   CRITICAL RESULT CALLED TO, READ BACK BY  AND VERIFIED WITH: CRISTINE   . RBC 07/29/2017 4.48  4.20 - 5.82 MIL/uL Final  . Hemoglobin 07/29/2017 13.1  13.0 - 17.1 g/dL Final  . HCT 07/29/2017 41.0  38.4 - 49.9 % Final  . MCV 07/29/2017 91.6  79.3 - 98.0 fL Final  . MCH 07/29/2017 29.3  27.2 - 33.4 pg Final  . MCHC 07/29/2017 32.0  32.0 - 36.0 g/dL Final  . RDW 07/29/2017 15.2* 11.0 - 14.6 % Final  . Platelet Count 07/29/2017 134* 140 - 400 K/uL Final  . Smear Review 07/29/2017 Variant lymphs present   Final  . Neutrophils Relative % 07/29/2017 7  % Final  . Neutro Abs 07/29/2017 5.4  1.5 - 6.5 K/uL Final  . Lymphocytes Relative 07/29/2017 91  % Final  . Lymphs Abs 07/29/2017 75.5* 0.9 - 3.3 K/uL Final  . Monocytes Relative 07/29/2017 2  % Final  . Monocytes Absolute 07/29/2017 1.8* 0.1 - 0.9 K/uL Final  . Eosinophils Relative 07/29/2017 0  % Final  . Eosinophils Absolute 07/29/2017 0.2  0.0 - 0.5 K/uL Final  . Basophils Relative 07/29/2017 0  % Final  . Basophils Absolute 07/29/2017 0.0  0.0 - 0.1 K/uL Final   Performed at [facility], [place] [address], [place], [place] [address]  . Phosphorus 07/29/2017 3.5  2.5 - 4.6 mg/dL Final   Performed at [place] [address] [place], [place] [address]  . Uric Acid, Serum 07/29/2017 4.2  2.6 - 7.4 mg/dL Final   Performed at [facility], [place] [address] [place],  [address]       Ardath Sax, MD

## 2017-08-09 NOTE — Assessment & Plan Note (Signed)
67 y.o. male with diagnosis of chronic lymphocytic leukemia. Based on presence of lymphadenopathy and splenomegaly in addition to the lymphocytosis, patient is staged at [place] II. Patient does have a mild thrombocytopenia, but that does not meet the diagnostic threshold to be considered stage IV. Prognostic evaluation demonstrates presence of isolated deletion 13q which places patient in good prognostic category along side negative testing for CD38.   Patient is currently receiving ibrutinib for palliative therapy for underlying leukemia.  Progressive improvement of the total white blood cell count and absolute lymphocyte count consistent with excellent disease response.  No progressive gammopathy.  Patient is tolerating medication very well without any recurrent bruising or lower extremity swelling.  Plan: --Continue Ibrutinib 420mg  PO QDay --Continue Allopurinol 300mg  PO QDay for hyperuricemia prevention --RTC 2 months for continued toxicity/treatment response monitoring.  We will obtain additional lab work and EKG for toxicity monitoring during the visit.

## 2017-08-12 ENCOUNTER — Other Ambulatory Visit: Payer: Self-pay

## 2017-08-12 DIAGNOSIS — C9110 Chronic lymphocytic leukemia of B-cell type not having achieved remission: Secondary | ICD-10-CM

## 2017-08-12 MED ORDER — ALLOPURINOL 300 MG PO TABS: ORAL_TABLET | ORAL | 0 refills | Status: DC

## 2017-08-13 MED FILL — IMBRUVICA 420 MG TAB: 420 | 28 days supply | Qty: 28 | Fill #0

## 2017-09-06 ENCOUNTER — Other Ambulatory Visit: Payer: Self-pay | Admitting: Hematology and Oncology

## 2017-09-06 DIAGNOSIS — C9110 Chronic lymphocytic leukemia of B-cell type not having achieved remission: Secondary | ICD-10-CM

## 2017-09-08 ENCOUNTER — Other Ambulatory Visit: Payer: Self-pay

## 2017-09-08 DIAGNOSIS — C9110 Chronic lymphocytic leukemia of B-cell type not having achieved remission: Secondary | ICD-10-CM

## 2017-09-08 MED ORDER — ALLOPURINOL 300 MG PO TABS: ORAL_TABLET | ORAL | 0 refills | Status: DC

## 2017-09-10 MED FILL — IMBRUVICA 420 MG TAB: 420 | 28 days supply | Qty: 28 | Fill #0

## 2017-09-13 ENCOUNTER — Telehealth: Payer: Self-pay | Admitting: Hematology and Oncology

## 2017-09-13 NOTE — Telephone Encounter (Signed)
Patient called to regarding another appointment

## 2017-09-14 ENCOUNTER — Encounter: Payer: Self-pay | Admitting: Hematology and Oncology

## 2017-09-14 ENCOUNTER — Inpatient Hospital Stay: Payer: BLUE CROSS/BLUE SHIELD | Attending: Hematology and Oncology | Admitting: Hematology and Oncology

## 2017-09-14 VITALS — BP 135/82 | HR 61 | Temp 98.1°F | Resp 18 | Ht 70.0 in | Wt 199.9 lb

## 2017-09-14 DIAGNOSIS — R21 Rash and other nonspecific skin eruption: Secondary | ICD-10-CM

## 2017-09-15 ENCOUNTER — Telehealth: Payer: Self-pay | Admitting: Hematology and Oncology

## 2017-09-15 NOTE — Telephone Encounter (Signed)
RTC as previously scheduled -- patient may need to modify due to conflict with other appointments per 5/1 los

## 2017-09-27 ENCOUNTER — Inpatient Hospital Stay: Payer: BLUE CROSS/BLUE SHIELD | Attending: Hematology and Oncology

## 2017-09-27 DIAGNOSIS — Z5111 Encounter for antineoplastic chemotherapy: Secondary | ICD-10-CM

## 2017-09-27 DIAGNOSIS — C9110 Chronic lymphocytic leukemia of B-cell type not having achieved remission: Secondary | ICD-10-CM | POA: Diagnosis present

## 2017-09-27 DIAGNOSIS — Z9189 Other specified personal risk factors, not elsewhere classified: Secondary | ICD-10-CM

## 2017-09-27 LAB — CBC WITH DIFFERENTIAL (CANCER CENTER ONLY)
BASOS PCT: 0 %
Basophils Absolute: 0.1 10*3/uL (ref 0.0–0.1)
EOS ABS: 0.2 10*3/uL (ref 0.0–0.5)
EOS PCT: 0 %
HCT: 39.8 % (ref 38.4–49.9)
Hemoglobin: 12.9 g/dL — ABNORMAL LOW (ref 13.0–17.1)
LYMPHS ABS: 49.7 10*3/uL — AB (ref 0.9–3.3)
Lymphocytes Relative: 87 %
MCH: 29.2 pg (ref 27.2–33.4)
MCHC: 32.3 g/dL (ref 32.0–36.0)
MCV: 90.4 fL (ref 79.3–98.0)
MONO ABS: 0.9 10*3/uL (ref 0.1–0.9)
MONOS PCT: 2 %
Neutro Abs: 6.2 10*3/uL (ref 1.5–6.5)
Neutrophils Relative %: 11 %
Platelet Count: 137 10*3/uL — ABNORMAL LOW (ref 140–400)
RBC: 4.41 MIL/uL (ref 4.20–5.82)
RDW: 15.2 % — AB (ref 11.0–14.6)
WBC Count: 57.1 10*3/uL (ref 4.0–10.3)

## 2017-09-27 LAB — CMP (CANCER CENTER ONLY)
ALK PHOS: 87 U/L (ref 40–150)
ALT: 10 U/L (ref 0–55)
ANION GAP: 6 (ref 3–11)
AST: 13 U/L (ref 5–34)
Albumin: 3.7 g/dL (ref 3.5–5.0)
BUN: 14 mg/dL (ref 7–26)
CO2: 23 mmol/L (ref 22–29)
Calcium: 8.5 mg/dL (ref 8.4–10.4)
Chloride: 112 mmol/L — ABNORMAL HIGH (ref 98–109)
Creatinine: 1.73 mg/dL — ABNORMAL HIGH (ref 0.70–1.30)
GFR, EST AFRICAN AMERICAN: 46 mL/min — AB (ref >60)
GFR, Estimated: 40 mL/min — ABNORMAL LOW (ref >60)
Glucose, Bld: 110 mg/dL (ref 70–140)
POTASSIUM: 4.1 mmol/L (ref 3.5–5.1)
SODIUM: 141 mmol/L (ref 136–145)
TOTAL PROTEIN: 6.2 g/dL — AB (ref 6.4–8.3)
Total Bilirubin: 0.4 mg/dL (ref 0.2–1.2)

## 2017-09-27 LAB — URIC ACID: URIC ACID, SERUM: 4 mg/dL (ref 2.6–7.4)

## 2017-09-27 LAB — MAGNESIUM: Magnesium: 1.8 mg/dL (ref 1.7–2.4)

## 2017-09-27 LAB — PHOSPHORUS: PHOSPHORUS: 2.5 mg/dL (ref 2.5–4.6)

## 2017-09-28 ENCOUNTER — Telehealth: Payer: Self-pay | Admitting: Hematology and Oncology

## 2017-09-28 ENCOUNTER — Inpatient Hospital Stay (HOSPITAL_BASED_OUTPATIENT_CLINIC_OR_DEPARTMENT_OTHER): Payer: BLUE CROSS/BLUE SHIELD | Admitting: Hematology and Oncology

## 2017-09-28 ENCOUNTER — Encounter: Payer: Self-pay | Admitting: Hematology and Oncology

## 2017-09-28 VITALS — BP 145/87 | HR 72 | Temp 98.4°F | Resp 17 | Ht 70.0 in | Wt 197.3 lb

## 2017-09-28 DIAGNOSIS — C9110 Chronic lymphocytic leukemia of B-cell type not having achieved remission: Secondary | ICD-10-CM | POA: Diagnosis not present

## 2017-09-28 DIAGNOSIS — Z5111 Encounter for antineoplastic chemotherapy: Secondary | ICD-10-CM

## 2017-09-28 NOTE — Telephone Encounter (Signed)
Appointments scheduled AVS/Calendar printed per 5/14 los °

## 2017-10-01 ENCOUNTER — Ambulatory Visit: Payer: BLUE CROSS/BLUE SHIELD | Admitting: Hematology and Oncology

## 2017-10-03 DIAGNOSIS — R21 Rash and other nonspecific skin eruption: Secondary | ICD-10-CM | POA: Insufficient documentation

## 2017-10-03 NOTE — Assessment & Plan Note (Signed)
67 y.o. male with diagnosis of chronic lymphocytic leukemia. Based on presence of lymphadenopathy and splenomegaly in addition to the lymphocytosis, patient is staged at [place] II. Patient does have a mild thrombocytopenia, but that does not meet the diagnostic threshold to be considered stage IV. Prognostic evaluation demonstrates presence of isolated deletion 13q which places patient in good prognostic category along side negative testing for CD38.   Patient is currently receiving ibrutinib for palliative therapy for underlying leukemia.  Presenting for acute symptoms of maculopapular rash over his extremities.  Appearance of the rash is not consistent with side effects of ibrutinib and is more concerning for possible insect bites or allergic reaction to a new chemical such as change in laundry detergent, fabric softener etc.  Plan: --Continue Ibrutinib 420mg  PO QDay --Continue Allopurinol 300mg  PO QDay for hyperuricemia prevention - Patient will keep his scheduled appointment with dermatology on 09/27/2017. --Return to our clinic for ibrutinib monitoring as previously scheduled.

## 2017-10-03 NOTE — Progress Notes (Signed)
[facility]-up Visit:  Assessment: Skin rash 67 y.o. male with diagnosis of chronic lymphocytic leukemia. Based on presence of lymphadenopathy and splenomegaly in addition to the lymphocytosis, patient is staged at [place] [facility]. Patient does have a mild thrombocytopenia, but that does not meet the diagnostic threshold to be considered stage IV. Prognostic evaluation demonstrates presence of isolated deletion 13q which places patient in good prognostic category along side negative testing for CD38.   Patient is currently receiving ibrutinib for palliative therapy for underlying leukemia.  Presenting for acute symptoms of maculopapular rash over his extremities.  Appearance of the rash is not consistent with side effects of ibrutinib and is more concerning for possible insect bites or allergic reaction to a new chemical such as change in laundry detergent, fabric softener etc.  Plan: --Continue Ibrutinib 471m PO QDay --Continue Allopurinol 3048mPO QDay for hyperuricemia prevention - Patient will keep his scheduled appointment with dermatology on 09/27/2017. --Return to our clinic for ibrutinib monitoring as previously scheduled.  Voice recognition software was used and creation of this note. Despite my best effort at editing the text, some misspelling/errors may have occurred.  No orders of the defined types were placed in this encounter.   Cancer Staging CLL (chronic lymphocytic leukemia) ([place] form: Chronic Lymphocytic Leukemia / Small Lymphocytic Lymphoma, AJCC 8th Edition - Clinical stage from 12/30/2016: Modified Rai Stage II (Modified Rai risk: Intermediate, Lymphocytosis: Present, Adenopathy: Present, Organomegaly: Present, Anemia: Absent, Thrombocytopenia: Absent) - Signed by PeArdath SaxMD on 12/30/2016   All questions were answered. . The patient knows to call the clinic with any problems, questions or concerns.  This note was electronically  signed.    History of Presenting Illness Wesley Bright a [age].o. male [place] [facility] [place] [facility] diagnosis of chronic lymphocytic leukemia, Rai II, del13q-positive. Patient initially presented to his primary care provider with complaints of progressive fatigue, excessive tiredness, decreasing ability to participate in the work and daily activities for the past 4-5 months. Patient also has had a weight loss of about 15 pounds over the same period of time.   Last visit to the clinic, we have discussed potential management options including observation vs active interventions such as systemic muli-agent chemoimmunotherapy and ibrutinib. Patient returns to the clinic today after deciding to initiate therapy with ibrutinib as he is very apprehensive about chemotherapy.  Patient returns to the clinic for new complaint of a rash.  Patient has developed a pruritic maculopapular rash for the past 2 weeks mostly over the distal extremities, but apparently extending proximally to cover forearms and legs.  So far, no rash over the trunk.  Patient also complaining of intermittent difficulty urinating and mild diarrhea.  No significant bruising or swelling in the lower extremities.   Oncological/hematological History: --Labs, 12/15/16: WBC  144, ALC  141, Mono 0.2, Baso 0.6, Hgb 13.7, Plt 109; Peripheral blood smear positive for atypical lymphocytes and smudge cells; LDH 159, beta-2 microglobulin 2.7, Haptoglobin 220, IgG 725. --Labs, 01/28/17: WBC  138, ALC  133, Hgb 13.1, Plt 104; LDH 157 --Labs, 02/25/17: WBC  210, ALC  203, Hgb 12.9, Plt 156; --Labs, 03/11/17: WBC  197, ALC  190, Hgb 13.3, Plt 100; LDH 145 --Labs, 04/02/17: WBC  203, ALC  195, Hgb 13.8, Plt 142; LDH 144;     IgG 807, IgA 125, IgM 31 --Labs, 06/04/17: WBC  145, ALC  138, Hgb 13.8, Plt 136; LDH 145;  IgG 838, IgA 129, IgM 33 --Labs, 08/02/17: WBC 83.0, ALC 75.5, Hgb 13.1, Plt 134; LDH 144, beta-2 microglobulin 2.5;  IgG  819   Oncological/hematological History:   CLL (chronic lymphocytic leukemia) ([place])   12/15/2016 Pathology Results    pBlood Flow/CLL FISH: Peripheral blood flow cytometry positive for presence of monoclonal B cells. IHC -- positive for CD5, CD19, CD20, CD21, CD22, CD23, HLA-DR, and lambda light-chains. FISH -- positive for heterozygous del13q, negative for del 17p, del 11q, and tr 12.      12/22/2016 Imaging    CT N/C/A/P: Diffuse non-bulky lymphadenopathy in the chest, abdomen, and pelvis and no significant lymphadenopathy in the neck. Splenomegaly with spleen measuring 13.8 cm.      12/28/2016 Initial Diagnosis    CLL (chronic lymphocytic leukemia) ([place])      12/29/2016 Pathology Results    Bone Marrow Bx: The sections show a cellularity ranging from 40 to 70% focally with numerous variably sized interstitial and paratrabecular lymphoid aggregates in addition to interstitial infiltrates of primarily small lymphoid cells with high nuclear cytoplasmic ratio, round to slightly irregular nuclei, dense chromatin and small to inconspicuous nucleoli. This is admixed with scattering of prolymphocytes/paraimmunoblasts including the presence of small proliferation centers in some aggregates. The background shows trilineage hematopoiesis including abundance of megakaryocytes. The lymphoid aggregates are overwhelmingly composed of B cells as seen with CD20 and CD79a associated with CD5 co-expression. Cyclin-D1 is generally negative although there are scattered positive cells most likely correlating with the presence of prolymphocytes/paraimmunoblasts. There is an admixed very minor T cell population as highlighted with CD3. Erythroid precursors: Relatively decreased in number but with orderly and progressive maturation. Granulocytic precursors: Relatively decreased in number but with orderly and progressive maturation. Megakaryocytes: Abundant with predominantly normal morphology. Lymphocytes/plasma cells: The  lymphocytes are markedly increased in number representing 80% of all cells and mostly consist of small lymphoid cells with very high nuclear cytoplasmic ratio, round to slightly irregular nuclei, coarse chromatin and generally inconspicuous nucleoli. Large plasma cell aggregates are not present.       01/27/2017 -  Chemotherapy    Ibrutinib 422m PO QDay        Medical History: Past Medical History:  Diagnosis Date  . External hemorrhoid     Surgical History: Past Surgical History:  Procedure Laterality Date  . APPENDECTOMY    . INGUINAL HERNIA REPAIR     left  . KNEE SURGERY     Left Knee    Family History: Family History  Problem Relation Age of Onset  . Liver cancer Brother   . Cancer Brother        Liver  . Esophageal cancer Father   . Cancer Father        Throat   . Cancer Mother        Bladder - cause of death    Social History: Social History   Socioeconomic History  . Marital status: Married    Spouse name: Not on file  . Number of children: 2  . Years of education: Not on file  . Highest education level: Not on file  Occupational History  . Occupation: [employer], [employer] . Financial resource strain: Not on file  . Food insecurity:    Worry: Not on file    Inability: Not on file  . Transportation needs:    Medical: Not on file    Non-medical: Not on file  Tobacco Use  .  Smoking status: Current Every Day Smoker    Packs/day: 0.50  . Smokeless tobacco: Never Used  Substance and Sexual Activity  . Alcohol use: Yes    Comment: occasional  . Drug use: No  . Sexual activity: Not on file  Lifestyle  . Physical activity:    Days per week: Not on file    Minutes per session: Not on file  . Stress: Not on file  Relationships  . Social connections:    Talks on phone: Not on file    Gets together: Not on file    Attends religious service: Not on file    Active member of club or organization: Not on  file    Attends meetings of clubs or organizations: Not on file    Relationship status: Not on file  . Intimate partner violence:    Fear of current or ex partner: Not on file    Emotionally abused: Not on file    Physically abused: Not on file    Forced sexual activity: Not on file  Other Topics Concern  . Not on file  Social History Narrative  . Not on file    Allergies: No Known Allergies  Medications:  Current Outpatient Medications  Medication Sig Dispense Refill  . allopurinol (ZYLOPRIM) 300 MG tablet TAKE 1 TABLET(300 MG) BY MOUTH DAILY 30 tablet 0  . cholecalciferol (VITAMIN D) 1000 units tablet Take 1,000 Units by mouth daily.    Marland Kitchen co-enzyme Q-10 30 MG capsule Take 30 mg by mouth 3 (three) times daily.    . IMBRUVICA 420 MG TABS TAKE 1 TABLET BY MOUTH DAILY. TAKE WITH A GLASS OF WATER AT THE SAME TIME EACH DAY. MAINTAIN HYDRATION. 28 tablet 0  . vitamin B-12 (CYANOCOBALAMIN) 100 MCG tablet Take 100 mcg by mouth daily.     No current facility-administered medications for this visit.     Review of Systems: Review of Systems  Constitutional: Negative for appetite change, diaphoresis, fever and unexpected weight change.  HENT:   Negative for hearing loss, lump/mass, mouth sores, nosebleeds, sore throat, tinnitus, trouble swallowing and voice change.   Eyes: Negative for eye problems and icterus.  Respiratory: Negative for chest tightness, cough, hemoptysis, shortness of breath and wheezing.   Cardiovascular: Negative for chest pain, leg swelling and palpitations.  Gastrointestinal: Negative for abdominal distention, abdominal pain, blood in stool, constipation, diarrhea, nausea, rectal pain and vomiting.  Genitourinary: Negative.    Musculoskeletal: Negative.   Skin: Positive for rash.  Neurological: Negative.   Hematological: Negative.   Psychiatric/Behavioral: Negative.      PHYSICAL EXAMINATION Blood pressure 135/82, pulse 61, temperature 98.1 F (36.7 C),  temperature source Oral, resp. rate 18, height _0  (1.778 m), weight 199 lb 14.4 oz (90.7 kg), SpO2 99 %.  ECOG PERFORMANCE STATUS: 1 - Symptomatic but completely ambulatory  Physical Exam  Constitutional: He is oriented to person, place, and time and well-developed, well-nourished, and in no distress. He appears not lethargic, not malnourished and not jaundiced. He appears not cachectic. No distress.  HENT:  Head: Normocephalic and atraumatic.  Mouth/Throat: Oropharynx is clear and moist and mucous membranes are normal. He has dentures.  Patient has no residual natural teeth with both upper and lower dentures.  Eyes: Pupils are equal, round, and reactive to light. Conjunctivae and EOM are normal. No scleral icterus.  Neck: No JVD present. No thyroid mass and no thyromegaly present.  Cardiovascular: Normal rate, regular rhythm, S1 normal and S2 normal.  Exam reveals gallop.  No murmur heard. Pulmonary/Chest: Effort normal and breath sounds normal. He has no decreased breath sounds. He has no wheezes. He has no rhonchi. He has no rales.  Abdominal:  Abdomen appears to be slightly distended, but soft, nontender and without palpable masses or hepatosplenomegaly  Lymphadenopathy:       Head (right side): No submandibular, no posterior auricular and no occipital adenopathy present.       Head (left side): No submandibular, no posterior auricular and no occipital adenopathy present.    He has no cervical adenopathy.    He has no axillary adenopathy.       Right: No inguinal and no supraclavicular adenopathy present.       Left: No inguinal and no supraclavicular adenopathy present.  Neurological: He is alert and oriented to person, place, and time. He has normal sensation, normal strength, normal reflexes and intact cranial nerves. He appears not lethargic.  Skin: Rash noted. He is not diaphoretic. No pallor.  New papular rash with 2 to 3 mm erythematous papules over bilateral hands, forearms,  feet and legs.  No apparent rash over the trunk or face.     LABORATORY DATA: I have personally reviewed the data as listed: No visits with results within 1 Week(s) from this visit.  Latest known visit with results is:  Appointment on 07/29/2017  Component Date Value Ref Range Status  . IgG (Immunoglobin G), Serum 07/29/2017 819  700 - 1,600 mg/dL Final  . IgA 07/29/2017 122  61 - 437 mg/dL Final  . IgM (Immunoglobulin M), Srm 07/29/2017 35  20 - 172 mg/dL Final   Comment: (NOTE) Performed At: [facility] [place], [place] [id number] Rush Farmer MD [id number] Performed at [facility], [place] [address], [place], [place] [address]   . Beta-2 Microglobulin 07/29/2017 2.5* 0.6 - 2.4 mg/L Final   Comment: (NOTE) Siemens Immulite 2000 Immunochemiluminometric assay (ICMA) Values obtained with different assay methods or kits cannot be used interchangeably. Results cannot be interpreted as absolute evidence of the presence or absence of malignant disease. Performed At: [facility] [place], [place] [id number] Rush Farmer MD [id number] Performed at [facility], [place] [address] [place], [place] [address]   . LDH 07/29/2017 144  125 - 245 U/L Final   Performed at [facility], [place] [address], [place], [place] [address]  . Sodium 07/29/2017 140  136 - 145 mmol/L Final  . Potassium 07/29/2017 3.8  3.5 - 5.1 mmol/L Final  . Chloride 07/29/2017 109  98 - 109 mmol/L Final  . CO2 07/29/2017 24  22 - 29 mmol/L Final  . Glucose, Bld 07/29/2017 85  70 - 140 mg/dL Final  . BUN 07/29/2017 14  7 - 26 mg/dL Final  . Creatinine 07/29/2017 1.51* 0.70 - 1.30 mg/dL Final  . Calcium 07/29/2017 9.0  8.4 - 10.4 mg/dL Final  . GFR, Est Non Af Am 07/29/2017 47* >60 mL/min Final  . GFR, Est AFR Am 07/29/2017 54* >60 mL/min Final   Comment: (NOTE) The eGFR has been calculated  using the CKD EPI equation. This calculation has not been validated in all clinical situations. eGFR's persistently <60 mL/min signify possible Chronic Kidney Disease.   Georgiann Hahn gap 07/29/2017 7  3 - 11 Final   Performed at [facility], [place] [address] [place], [place] [address]  . WBC Count 07/29/2017 83.0* 4.0 -  10.3 K/uL Final   CRITICAL RESULT CALLED TO, READ BACK BY AND VERIFIED WITH: CRISTINE   . RBC 07/29/2017 4.48  4.20 - 5.82 MIL/uL Final  . Hemoglobin 07/29/2017 13.1  13.0 - 17.1 g/dL Final  . HCT 07/29/2017 41.0  38.4 - 49.9 % Final  . MCV 07/29/2017 91.6  79.3 - 98.0 fL Final  . MCH 07/29/2017 29.3  27.2 - 33.4 pg Final  . MCHC 07/29/2017 32.0  32.0 - 36.0 g/dL Final  . RDW 07/29/2017 15.2* 11.0 - 14.6 % Final  . Platelet Count 07/29/2017 134* 140 - 400 K/uL Final  . Smear Review 07/29/2017 Variant lymphs present   Final  . Neutrophils Relative % 07/29/2017 7  % Final  . Neutro Abs 07/29/2017 5.4  1.5 - 6.5 K/uL Final  . Lymphocytes Relative 07/29/2017 91  % Final  . Lymphs Abs 07/29/2017 75.5* 0.9 - 3.3 K/uL Final  . Monocytes Relative 07/29/2017 2  % Final  . Monocytes Absolute 07/29/2017 1.8* 0.1 - 0.9 K/uL Final  . Eosinophils Relative 07/29/2017 0  % Final  . Eosinophils Absolute 07/29/2017 0.2  0.0 - 0.5 K/uL Final  . Basophils Relative 07/29/2017 0  % Final  . Basophils Absolute 07/29/2017 0.0  0.0 - 0.1 K/uL Final   Performed at [facility], [place] [address] [place], [place] [address]  . Phosphorus 07/29/2017 3.5  2.5 - 4.6 mg/dL Final   Performed at [place] [address] [place], [place] [address]  . Uric Acid, Serum 07/29/2017 4.2  2.6 - 7.4 mg/dL Final   Performed at [facility], [place] [address] [place],  [address]       Ardath Sax, MD

## 2017-10-05 ENCOUNTER — Other Ambulatory Visit: Payer: Self-pay | Admitting: Hematology and Oncology

## 2017-10-05 DIAGNOSIS — C9110 Chronic lymphocytic leukemia of B-cell type not having achieved remission: Secondary | ICD-10-CM

## 2017-10-08 ENCOUNTER — Other Ambulatory Visit: Payer: Self-pay

## 2017-10-08 DIAGNOSIS — C9110 Chronic lymphocytic leukemia of B-cell type not having achieved remission: Secondary | ICD-10-CM

## 2017-10-08 MED ORDER — ALLOPURINOL 300 MG PO TABS: ORAL_TABLET | ORAL | 0 refills | Status: DC

## 2017-10-08 MED FILL — IMBRUVICA 420 MG TAB: 420 | 28 days supply | Qty: 28 | Fill #0

## 2017-10-14 NOTE — Assessment & Plan Note (Signed)
66 y.o. male with diagnosis of chronic lymphocytic leukemia. Based on presence of lymphadenopathy and splenomegaly in addition to the lymphocytosis, patient is staged at [place] [facility]. Patient does have a mild thrombocytopenia, but that does not meet the diagnostic threshold to be considered stage IV. Prognostic evaluation demonstrates presence of isolated deletion 13q which places patient in good prognostic category along side negative testing for CD38.   Patient is currently receiving ibrutinib for palliative therapy for underlying leukemia.  Previously noted maculopapular rash was attributed to insect bites with discovery of bedbug infestation at the patient's house.  Symptoms are improving after appropriate measures were taken to eradicate infestation.  Patient appears to be tolerating ibrutinib without difficulties.  Lymphocytosis continues to improve  Plan: --Continue Ibrutinib 420mg  PO QDay --Continue Allopurinol 300mg  PO QDay for hyperuricemia prevention --Return to clinic in 2 months with lab work for continued disease monitoring.

## 2017-10-14 NOTE — Progress Notes (Signed)
[facility] Follow-up Visit:  Assessment: CLL (chronic lymphocytic leukemia) ([name]) 67 y.o. male with diagnosis of chronic lymphocytic leukemia. Based on presence of lymphadenopathy and splenomegaly in addition to the lymphocytosis, patient is staged at [place] II. Patient does have a mild thrombocytopenia, but that does not meet the diagnostic threshold to be considered stage IV. Prognostic evaluation demonstrates presence of isolated deletion 13q which places patient in good prognostic category along side negative testing for CD38.   Patient is currently receiving ibrutinib for palliative therapy for underlying leukemia.  Previously noted maculopapular rash was attributed to insect bites with discovery of bedbug infestation at the patient's house.  Symptoms are improving after appropriate measures were taken to eradicate infestation.  Patient appears to be tolerating ibrutinib without difficulties.  Lymphocytosis continues to improve  Plan: --Continue Ibrutinib 46m PO QDay --Continue Allopurinol 3096mPO QDay for hyperuricemia prevention --Return to clinic in 2 months with lab work for continued disease monitoring.  Voice recognition software was used and creation of this note. Despite my best effort at editing the text, some misspelling/errors may have occurred.  Orders Placed This Encounter  Procedures  . CBC with Differential (Cancer Center Only)    Standing Status:   Future    Standing Expiration Date:   09/29/2018  . CMP ([facility])    Standing Status:   Future    Standing Expiration Date:   09/29/2018  . Lactate dehydrogenase (LDH)    Standing Status:   Future    Standing Expiration Date:   09/28/2018  . Uric acid    Standing Status:   Future    Standing Expiration Date:   09/28/2018  . Magnesium    Standing Status:   Future    Standing Expiration Date:   09/28/2018  . Beta 2 microglobulin    Standing Status:   Future    Standing Expiration Date:    09/28/2018  . QIG  (Quant. immunoglobulins  - IgG, IgA, IgM)    Standing Status:   Future    Standing Expiration Date:   09/28/2018    Cancer Staging CLL (chronic lymphocytic leukemia) ([facility]aging form: Chronic Lymphocytic Leukemia / Small Lymphocytic Lymphoma, AJCC 8th Edition - Clinical stage from 12/30/2016: Modified Rai Stage II (Modified Rai risk: Intermediate, Lymphocytosis: Present, Adenopathy: Present, Organomegaly: Present, Anemia: Absent, Thrombocytopenia: Absent) - Signed by PeArdath SaxMD on 12/30/2016   All questions were answered. . The patient knows to call the clinic with any problems, questions or concerns.  This note was electronically signed.    History of Presenting Illness [name] a 89.o. male followed in the [facility] diagnosis of chronic lymphocytic leukemia, Rai II, del13q-positive. Patient initially presented to his primary care provider with complaints of progressive fatigue, excessive tiredness, decreasing ability to participate in the work and daily activities for the past 4-5 months. Patient also has had a weight loss of about 15 pounds over the same period of time.   Last visit to the clinic, we have discussed potential management options including observation vs active interventions such as systemic muli-agent chemoimmunotherapy and ibrutinib. Patient returns to the clinic today after deciding to initiate therapy with ibrutinib as he is very apprehensive about chemotherapy.  Patient returns to the clinic for continued hematological monitoring for the underlying CLL diagnosis.  At last visit to the clinic, patient had a new rash.  Patient has developed a pruritic maculopapular rash for the past 3 weeks  mostly over the distal extremities, but apparently extending proximally to cover forearms and legs.  So far, no rash over the trunk.  The reason for rash was discovered as a bedbug infestation which the patient and his wife have addressed with  interval resolution/improvement of the rash.   Oncological/hematological History: --Labs, 12/15/16: WBC  144, ALC  141, Mono 0.2, Baso 0.6, Hgb 13.7, Plt 109; Peripheral blood smear positive for atypical lymphocytes and smudge cells; LDH 159, beta-2 microglobulin 2.7, Haptoglobin 220, IgG 725. --Labs, 01/28/17: WBC  138, ALC  133, Hgb 13.1, Plt 104; LDH 157 --Labs, 02/25/17: WBC  210, ALC  203, Hgb 12.9, Plt 156; --Labs, 03/11/17: WBC  197, ALC  190, Hgb 13.3, Plt 100; LDH 145 --Labs, 04/02/17: WBC  203, ALC  195, Hgb 13.8, Plt 142; LDH 144;     IgG 807, IgA 125, IgM 31 --Labs, 06/04/17: WBC  145, ALC  138, Hgb 13.8, Plt 136; LDH 145;     IgG 838, IgA 129, IgM 33 --Labs, 08/02/17: WBC 83.0, ALC 75.5, Hgb 13.1, Plt 134; LDH 144, beta-2 microglobulin 2.5;  IgG 819 --Labs, 09/28/17: WBC 57.1, ALC 49.7, Hgb 12.9, Plt 137;   Oncological/hematological History:   CLL (chronic lymphocytic leukemia) ([place])   12/15/2016 Pathology Results    pBlood Flow/CLL FISH: Peripheral blood flow cytometry positive for presence of monoclonal B cells. IHC -- positive for CD5, CD19, CD20, CD21, CD22, CD23, HLA-DR, and lambda light-chains. FISH -- positive for heterozygous del13q, negative for del 17p, del 11q, and tr 12.      12/22/2016 Imaging    CT N/C/A/P: Diffuse non-bulky lymphadenopathy in the chest, abdomen, and pelvis and no significant lymphadenopathy in the neck. Splenomegaly with spleen measuring 13.8 cm.      12/28/2016 Initial Diagnosis    CLL (chronic lymphocytic leukemia) ([place])      12/29/2016 Pathology Results    Bone Marrow Bx: The sections show a cellularity ranging from 40 to 70% focally with numerous variably sized interstitial and paratrabecular lymphoid aggregates in addition to interstitial infiltrates of primarily small lymphoid cells with high nuclear cytoplasmic ratio, round to slightly irregular nuclei, dense chromatin and small to inconspicuous nucleoli. This is admixed with scattering of  prolymphocytes/paraimmunoblasts including the presence of small proliferation centers in some aggregates. The background shows trilineage hematopoiesis including abundance of megakaryocytes. The lymphoid aggregates are overwhelmingly composed of B cells as seen with CD20 and CD79a associated with CD5 co-expression. Cyclin-D1 is generally negative although there are scattered positive cells most likely correlating with the presence of prolymphocytes/paraimmunoblasts. There is an admixed very minor T cell population as highlighted with CD3. Erythroid precursors: Relatively decreased in number but with orderly and progressive maturation. Granulocytic precursors: Relatively decreased in number but with orderly and progressive maturation. Megakaryocytes: Abundant with predominantly normal morphology. Lymphocytes/plasma cells: The lymphocytes are markedly increased in number representing 80% of all cells and mostly consist of small lymphoid cells with very high nuclear cytoplasmic ratio, round to slightly irregular nuclei, coarse chromatin and generally inconspicuous nucleoli. Large plasma cell aggregates are not present.       01/27/2017 -  Chemotherapy    Ibrutinib 419m PO QDay        Medical History: Past Medical History:  Diagnosis Date  . External hemorrhoid     Surgical History: Past Surgical History:  Procedure Laterality Date  . APPENDECTOMY    . INGUINAL HERNIA REPAIR     left  . KNEE SURGERY  Left Knee    Family History: Family History  Problem Relation Age of Onset  . Liver cancer Brother   . Cancer Brother        Liver  . Esophageal cancer Father   . Cancer Father        Throat   . Cancer Mother        Bladder - cause of death    Social History: Social History   Socioeconomic History  . Marital status: Married    Spouse name: Not on file  . Number of children: 2  . Years of education: Not on file  . Highest education level: Not on file  Occupational History   . Occupation: Buyer, retail: [employer]  . Financial resource strain: Not on file  . Food insecurity:    Worry: Not on file    Inability: Not on file  . Transportation needs:    Medical: Not on file    Non-medical: Not on file  Tobacco Use  . Smoking status: Current Every Day Smoker    Packs/day: 0.50  . Smokeless tobacco: Never Used  Substance and Sexual Activity  . Alcohol use: Yes    Comment: occasional  . Drug use: No  . Sexual activity: Not on file  Lifestyle  . Physical activity:    Days per week: Not on file    Minutes per session: Not on file  . Stress: Not on file  Relationships  . Social connections:    Talks on phone: Not on file    Gets together: Not on file    Attends religious service: Not on file    Active member of club or organization: Not on file    Attends meetings of clubs or organizations: Not on file    Relationship status: Not on file  . Intimate partner violence:    Fear of current or ex partner: Not on file    Emotionally abused: Not on file    Physically abused: Not on file    Forced sexual activity: Not on file  Other Topics Concern  . Not on file  Social History Narrative  . Not on file    Allergies: No Known Allergies  Medications:  Current Outpatient Medications  Medication Sig Dispense Refill  . allopurinol (ZYLOPRIM) 300 MG tablet TAKE 1 TABLET(300 MG) BY MOUTH DAILY 30 tablet 0  . cholecalciferol (VITAMIN D) 1000 units tablet Take 1,000 Units by mouth daily.    Marland Kitchen co-enzyme Q-10 30 MG capsule Take 30 mg by mouth 3 (three) times daily.    . IMBRUVICA 420 MG TABS TAKE 1 TABLET BY MOUTH DAILY. TAKE WITH A GLASS OF WATER AT THE SAME TIME EACH DAY. MAINTAIN HYDRATION. 28 tablet 0  . vitamin B-12 (CYANOCOBALAMIN) 100 MCG tablet Take 100 mcg by mouth daily.     No current facility-administered medications for this visit.     Review of Systems: Review of Systems  Constitutional: Negative for  appetite change, diaphoresis, fever and unexpected weight change.  HENT:   Negative for hearing loss, lump/mass, mouth sores, nosebleeds, sore throat, tinnitus, trouble swallowing and voice change.   Eyes: Negative for eye problems and icterus.  Respiratory: Negative for chest tightness, cough, hemoptysis, shortness of breath and wheezing.   Cardiovascular: Negative for chest pain, leg swelling and palpitations.  Gastrointestinal: Negative for abdominal distention, abdominal pain, blood in stool, constipation, diarrhea, nausea, rectal pain and vomiting.  Genitourinary: Negative.  Musculoskeletal: Negative.   Skin: Positive for rash.  Neurological: Negative.   Hematological: Negative.   Psychiatric/Behavioral: Negative.      PHYSICAL EXAMINATION Blood pressure (!) 145/87, pulse 72, temperature 98.4 F (36.9 C), temperature source Oral, resp. rate 17, height _0  (1.778 m), weight 197 lb 4.8 oz (89.5 kg), SpO2 99 %.  ECOG PERFORMANCE STATUS: 1 - Symptomatic but completely ambulatory  Physical Exam  Constitutional: He is oriented to person, place, and time and well-developed, well-nourished, and in no distress. He appears not lethargic, not malnourished and not jaundiced. He appears not cachectic. No distress.  HENT:  Head: Normocephalic and atraumatic.  Mouth/Throat: Oropharynx is clear and moist and mucous membranes are normal. He has dentures.  Patient has no residual natural teeth with both upper and lower dentures.  Eyes: Pupils are equal, round, and reactive to light. Conjunctivae and EOM are normal. No scleral icterus.  Neck: No JVD present. No thyroid mass and no thyromegaly present.  Cardiovascular: Normal rate, regular rhythm, S1 normal and S2 normal. Exam reveals gallop.  No murmur heard. Pulmonary/Chest: Effort normal and breath sounds normal. He has no decreased breath sounds. He has no wheezes. He has no rhonchi. He has no rales.  Abdominal:  Abdomen appears to be  slightly distended, but soft, nontender and without palpable masses or hepatosplenomegaly  Lymphadenopathy:       Head (right side): No submandibular, no posterior auricular and no occipital adenopathy present.       Head (left side): No submandibular, no posterior auricular and no occipital adenopathy present.    He has no cervical adenopathy.    He has no axillary adenopathy.       Right: No inguinal and no supraclavicular adenopathy present.       Left: No inguinal and no supraclavicular adenopathy present.  Neurological: He is alert and oriented to person, place, and time. He has normal sensation, normal strength, normal reflexes and intact cranial nerves. He appears not lethargic.  Skin: Rash noted. He is not diaphoretic. No pallor.  Resolving rash on the extremities     LABORATORY DATA: I have personally reviewed the data as listed: Appointment on 09/27/2017  Component Date Value Ref Range Status  . WBC Count 09/27/2017 57.1* 4.0 - 10.3 K/uL Final   CRITICAL RESULT CALLED TO, READ BACK BY AND VERIFIED WITH: DR. Lebron Conners    . RBC 09/27/2017 4.41  4.20 - 5.82 MIL/uL Final  . Hemoglobin 09/27/2017 12.9* 13.0 - 17.1 g/dL Final  . HCT 09/27/2017 39.8  38.4 - 49.9 % Final  . MCV 09/27/2017 90.4  79.3 - 98.0 fL Final  . MCH 09/27/2017 29.2  27.2 - 33.4 pg Final  . MCHC 09/27/2017 32.3  32.0 - 36.0 g/dL Final  . RDW 09/27/2017 15.2* 11.0 - 14.6 % Final  . Platelet Count 09/27/2017 137* 140 - 400 K/uL Final  . Smear Review 09/27/2017 VARIANT LYMPHS, SMUDGE CELLS   Final  . Neutrophils Relative % 09/27/2017 11  % Final  . Neutro Abs 09/27/2017 6.2  1.5 - 6.5 K/uL Final  . Lymphocytes Relative 09/27/2017 87  % Final  . Lymphs Abs 09/27/2017 49.7* 0.9 - 3.3 K/uL Final  . Monocytes Relative 09/27/2017 2  % Final  . Monocytes Absolute 09/27/2017 0.9  0.1 - 0.9 K/uL Final  . Eosinophils Relative 09/27/2017 0  % Final  . Eosinophils Absolute 09/27/2017 0.2  0.0 - 0.5 K/uL Final  . Basophils  Relative 09/27/2017 0  %  Final  . Basophils Absolute 09/27/2017 0.1  0.0 - 0.1 K/uL Final   Performed at [facility], [place] [address], [place], [place] [address]  . Sodium 09/27/2017 141  136 - 145 mmol/L Final  . Potassium 09/27/2017 4.1  3.5 - 5.1 mmol/L Final  . Chloride 09/27/2017 112* 98 - 109 mmol/L Final  . CO2 09/27/2017 23  22 - 29 mmol/L Final  . Glucose, Bld 09/27/2017 110  70 - 140 mg/dL Final  . BUN 09/27/2017 14  7 - 26 mg/dL Final  . Creatinine 09/27/2017 1.73* 0.70 - 1.30 mg/dL Final  . Calcium 09/27/2017 8.5  8.4 - 10.4 mg/dL Final  . Total Protein 09/27/2017 6.2* 6.4 - 8.3 g/dL Final  . Albumin 09/27/2017 3.7  3.5 - 5.0 g/dL Final  . AST 09/27/2017 13  5 - 34 U/L Final  . ALT 09/27/2017 10  0 - 55 U/L Final  . Alkaline Phosphatase 09/27/2017 87  40 - 150 U/L Final  . Total Bilirubin 09/27/2017 0.4  0.2 - 1.2 mg/dL Final  . GFR, Est Non Af Am 09/27/2017 40* >60 mL/min Final  . GFR, Est AFR Am 09/27/2017 46* >60 mL/min Final   Comment: (NOTE) The eGFR has been calculated using the CKD EPI equation. This calculation has not been validated in all clinical situations. eGFR's persistently <60 mL/min signify possible Chronic Kidney Disease.   Georgiann Hahn gap 09/27/2017 6  3 - 11 Final   Performed at [facility], [place] [address] [place], [place] [address]  . Magnesium 09/27/2017 1.8  1.7 - 2.4 mg/dL Final   Performed at [facility], [place] [address], [place], [place] [address]  . Uric Acid, Serum 09/27/2017 4.0  2.6 - 7.4 mg/dL Final   Performed at [facility], [place] [address], [place], [place] [address]  . Phosphorus 09/27/2017 2.5  2.5 - 4.6 mg/dL Final   Performed at [facility] [address], [place],  [address]       Ardath Sax, MD

## 2017-10-28 ENCOUNTER — Other Ambulatory Visit: Payer: Self-pay | Admitting: Hematology and Oncology

## 2017-10-28 DIAGNOSIS — C9110 Chronic lymphocytic leukemia of B-cell type not having achieved remission: Secondary | ICD-10-CM

## 2017-11-03 ENCOUNTER — Telehealth: Payer: Self-pay

## 2017-11-03 ENCOUNTER — Telehealth: Payer: Self-pay | Admitting: Hematology

## 2017-11-03 ENCOUNTER — Inpatient Hospital Stay: Payer: BLUE CROSS/BLUE SHIELD

## 2017-11-03 ENCOUNTER — Other Ambulatory Visit: Payer: Self-pay

## 2017-11-03 ENCOUNTER — Inpatient Hospital Stay: Payer: BLUE CROSS/BLUE SHIELD | Attending: Hematology and Oncology | Admitting: Hematology

## 2017-11-03 VITALS — BP 138/84 | HR 66 | Temp 98.7°F | Resp 17 | Ht 70.0 in | Wt 195.6 lb

## 2017-11-03 DIAGNOSIS — R21 Rash and other nonspecific skin eruption: Secondary | ICD-10-CM | POA: Diagnosis not present

## 2017-11-03 DIAGNOSIS — C9110 Chronic lymphocytic leukemia of B-cell type not having achieved remission: Secondary | ICD-10-CM | POA: Diagnosis not present

## 2017-11-03 DIAGNOSIS — K5900 Constipation, unspecified: Secondary | ICD-10-CM

## 2017-11-03 LAB — CBC WITH DIFFERENTIAL (CANCER CENTER ONLY)
Basophils Absolute: 0.2 10*3/uL — ABNORMAL HIGH (ref 0.0–0.1)
Basophils Relative: 1 %
EOS ABS: 0.1 10*3/uL (ref 0.0–0.5)
EOS PCT: 0 %
HCT: 37.9 % — ABNORMAL LOW (ref 38.4–49.9)
Hemoglobin: 12.3 g/dL — ABNORMAL LOW (ref 13.0–17.1)
LYMPHS PCT: 88 %
Lymphs Abs: 26.8 10*3/uL — ABNORMAL HIGH (ref 0.9–3.3)
MCH: 29.2 pg (ref 27.2–33.4)
MCHC: 32.4 g/dL (ref 32.0–36.0)
MCV: 90.1 fL (ref 79.3–98.0)
MONO ABS: 0.6 10*3/uL (ref 0.1–0.9)
Monocytes Relative: 2 %
Neutro Abs: 2.6 10*3/uL (ref 1.5–6.5)
Neutrophils Relative %: 9 %
PLATELETS: 109 10*3/uL — AB (ref 140–400)
RBC: 4.2 MIL/uL (ref 4.20–5.82)
RDW: 15 % — AB (ref 11.0–14.6)
WBC: 30.3 10*3/uL — AB (ref 4.0–10.3)

## 2017-11-03 LAB — CMP (CANCER CENTER ONLY)
ALT: <6 U/L (ref 0–55)
ANION GAP: 7 (ref 3–11)
AST: 11 U/L (ref 5–34)
Albumin: 3.5 g/dL (ref 3.5–5.0)
Alkaline Phosphatase: 107 U/L (ref 40–150)
BUN: 19 mg/dL (ref 7–26)
CHLORIDE: 108 mmol/L (ref 98–109)
CO2: 23 mmol/L (ref 22–29)
Calcium: 8.4 mg/dL (ref 8.4–10.4)
Creatinine: 1.62 mg/dL — ABNORMAL HIGH (ref 0.70–1.30)
GFR, EST AFRICAN AMERICAN: 50 mL/min — AB (ref >60)
GFR, EST NON AFRICAN AMERICAN: 43 mL/min — AB (ref >60)
Glucose, Bld: 104 mg/dL (ref 70–140)
Potassium: 3.8 mmol/L (ref 3.5–5.1)
Sodium: 138 mmol/L (ref 136–145)
TOTAL PROTEIN: 6.1 g/dL — AB (ref 6.4–8.3)

## 2017-11-03 LAB — URIC ACID: URIC ACID, SERUM: 4.3 mg/dL (ref 2.6–7.4)

## 2017-11-03 LAB — RETICULOCYTES
RBC.: 4.16 MIL/uL — AB (ref 4.20–5.82)
RETIC COUNT ABSOLUTE: 45.8 10*3/uL (ref 34.8–93.9)
RETIC CT PCT: 1.1 % (ref 0.8–1.8)

## 2017-11-03 LAB — LACTATE DEHYDROGENASE: LDH: 164 U/L (ref 125–245)

## 2017-11-03 MED ORDER — IBRUTINIB 420 MG PO TABS: 420.0000 mg | ORAL_TABLET | Freq: Every day | ORAL | 2 refills | Status: DC

## 2017-11-03 MED FILL — IMBRUVICA 420 MG TAB: 420 | 28 days supply | Qty: 28 | Fill #0

## 2017-11-03 NOTE — Progress Notes (Signed)
HEMATOLOGY/ONCOLOGY CONSULTATION NOTE  Date of Service: 11/03/2017  Patient Care Team: Patient, No Pcp Per as PCP - General (General Practice)  CHIEF COMPLAINTS/PURPOSE OF CONSULTATION:  F/u for Chronic Lymphocytic Leukemia   Oncologic History:  Mr Zaivion Kundrat was initially diagnosed with CLL after a CLL FISH study revealed heterozygous 13q deletion within 92% cells. Subsequent imaging showed diffuse non-bulky lymphadenopathy in chest, abdomen, and pelvis with splenomegaly of 1200 cm^3. The pt began daily Ibrutinib 420mg  on 01/27/17 after becoming increasing fatigued and lost 15 pounds of weight over 4-5 months.   HISTORY OF PRESENTING ILLNESS:   Lyman Balingit is a wonderful 67 y.o. male who has been previously seen by my colleague Dr Grace Isaac for evaluation and management of Chronic Lymphocytic Leukemia. He is accompanied today by his daughter and wife. The pt reports that he is doing well overall.   The pt reports that he is feeling better and his energy levels have been improving. He has continued to take Vitamin D, Vitamin B12 and co-enzyme Q. He notes no problems taking his Ibrutinib besides mild rashes that come and go without itching or bothering him very much.   He notes that he has had problems in the past with fluid in the abdomen, and has taken medication for this. He has also intermittently had constipation.   Most recent lab results (11/03/17) of CBC  is as follows: all values are WNL except for WBC at 30.3k, HGB at 12.3, HCT at 37.9, RDW at 15.0, PLT at 109k, Lymphs abs at 26.8k, Creatinine at 1.62, Total Protein at 6.1, Total bilirubin at <0.2, GFR at 43. LDH 6/19;19 is WNL at 164 Uric Acid 11/03/17 is WNL at 4.3  On review of systems, pt reports mild skin rashes, increasing energy levels, and denies mouth sores, fevers, chills, diarrhea, inguinal rashes, itching, vision changes, abdominal pains, leg swelling, and any other symptoms.   MEDICAL HISTORY:  Past Medical  History:  Diagnosis Date  . External hemorrhoid     SURGICAL HISTORY: Past Surgical History:  Procedure Laterality Date  . APPENDECTOMY    . INGUINAL HERNIA REPAIR     left  . KNEE SURGERY     Left Knee    SOCIAL HISTORY: Social History   Socioeconomic History  . Marital status: Married    Spouse name: Not on file  . Number of children: 2  . Years of education: Not on file  . Highest education level: Not on file  Occupational History  . 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 Financial resource strain: Not on file  . Food insecurity:    Worry: Not on file    Inability: Not on file  . Transportation needs:    Medical: Not on file    Non-medical: Not on file  Tobacco Use  . Smoking status: Current Every Day Smoker    Packs/day: 0.50  . Smokeless tobacco: Never Used  Substance and Sexual Activity  . Alcohol use: Yes    Comment: occasional  . Drug use: No  . Sexual activity: Not on file  Lifestyle  . Physical activity:    Days per week: Not on file    Minutes per session: Not on file  . Stress: Not on file  Relationships  . Social connections:    Talks on phone: Not on file    Gets together: Not on file    Attends religious service: Not on file  Active member of club or organization: Not on file    Attends meetings of clubs or organizations: Not on file    Relationship status: Not on file  . Intimate partner violence:    Fear of current or ex partner: Not on file    Emotionally abused: Not on file    Physically abused: Not on file    Forced sexual activity: Not on file  Other Topics Concern  . Not on file  Social History Narrative  . Not on file    FAMILY HISTORY: Family History  Problem Relation Age of Onset  . Liver cancer Brother   . Cancer Brother        Liver  . Esophageal cancer Father   . Cancer Father        Throat   . Cancer Mother        Bladder - cause of death    ALLERGIES:  has No Known  Allergies.  MEDICATIONS:  Current Outpatient Medications  Medication Sig Dispense Refill  . allopurinol (ZYLOPRIM) 300 MG tablet TAKE 1 TABLET(300 MG) BY MOUTH DAILY 30 tablet 0  . cholecalciferol (VITAMIN D) 1000 units tablet Take 1,000 Units by mouth daily.    Marland Kitchen co-enzyme Q-10 30 MG capsule Take 30 mg by mouth 3 (three) times daily.    . IMBRUVICA 420 MG TABS TAKE 1 TABLET BY MOUTH DAILY. TAKE WITH A GLASS OF WATER AT THE SAME TIME EACH DAY. MAINTAIN HYDRATION. 28 tablet 0  . vitamin B-12 (CYANOCOBALAMIN) 100 MCG tablet Take 100 mcg by mouth daily.     No current facility-administered medications for this visit.     REVIEW OF SYSTEMS:    10 Point review of Systems was done is negative except as noted above.  PHYSICAL EXAMINATION: ECOG PERFORMANCE STATUS: 1 - Symptomatic but completely ambulatory  . Vitals:   11/03/17 1505  BP: 138/84  Pulse: 66  Resp: 17  Temp: 98.7 F (37.1 C)  SpO2: 99%   Filed Weights   11/03/17 1505  Weight: 195 lb 9.6 oz (88.7 kg)   .Body mass index is 28.07 kg/m.  GENERAL:alert, in no acute distress and comfortable SKIN: no acute rashes, no significant lesions EYES: conjunctiva are pink and non-injected, sclera anicteric OROPHARYNX: MMM, no exudates, no oropharyngeal erythema or ulceration NECK: supple, no JVD LYMPH:  no palpable lymphadenopathy in the cervical, axillary or inguinal regions LUNGS: clear to auscultation b/l with normal respiratory effort HEART: regular rate & rhythm ABDOMEN:  normoactive bowel sounds , non tender, not distended.splenomegaly just palpable under left costal margin. Extremity: no pedal edema PSYCH: alert & oriented x 3 with fluent speech NEURO: no focal motor/sensory deficits  LABORATORY DATA:  I have reviewed the data as listed  . CBC Latest Ref Rng & Units 11/03/2017 09/27/2017 07/29/2017  WBC 4.0 - 10.3 K/uL 30.3(H) 57.1(HH) 83.0(HH)  Hemoglobin 13.0 - 17.1 g/dL 12.3(L) 12.9(L) 13.1  Hematocrit 38.4 -  49.9 % 37.9(L) 39.8 41.0  Platelets 140 - 400 K/uL 109(L) 137(L) 134(L)    . CMP Latest Ref Rng & Units 11/03/2017 09/27/2017 07/29/2017  Glucose 70 - 140 mg/dL 104 110 85  BUN 7 - 26 mg/dL 19 14 14   Creatinine 0.70 - 1.30 mg/dL 1.62(H) 1.73(H) 1.51(H)  Sodium 136 - 145 mmol/L 138 141 140  Potassium 3.5 - 5.1 mmol/L 3.8 4.1 3.8  Chloride 98 - 109 mmol/L 108 112(H) 109  CO2 22 - 29 mmol/L 23 23 24   Calcium 8.4 -  10.4 mg/dL 8.4 8.5 9.0  Total Protein 6.4 - 8.3 g/dL 6.1(L) 6.2(L) -  Total Bilirubin 0.2 - 1.2 mg/dL <0.2(L) 0.4 -  Alkaline Phos 40 - 150 U/L 107 87 -  AST 5 - 34 U/L 11 13 -  ALT 0 - 55 U/L <6 10 -   12/31/16 Flow Cytometry:    12/15/16 CLL FISH:    RADIOGRAPHIC STUDIES: I have personally reviewed the radiological images as listed and agreed with the findings in the report. No results found.  ASSESSMENT & PLAN:  67 y.o. male with  1. Chronic Lymphocytic Leukemia - monoalleilic 08X deletion. Was started on Ibrutinib from 01/2017 due to fatigue, weight loss and significant splenomegaly.  2. Grade 1 intermittent rash ?related to [place] -no prohibitive toxicities from Ibrutinib - grade 1 rash. Reasonable to take over the counter antihistamines - discussed this. -Discussed patient's most recent labs from 11/03/17, WBC decreased to 30.3k, Lymphs abs decreased to 26.8k, PLT at 109k.  -Recommend OTC Hydrocortisone for mild skin rashes if necessary or Sarna if itching develops -Avoid long, hot showers and keep well moisturized -No palpable hepatosplenomegaly  -Pt is okay to stop allopurinol  -Continue staying well hydrated, drinking 48-64 oz of water at least -Will see pt back in 2 months with labs -Will refill Ibrutinib today   3.  Patient Active Problem List   Diagnosis Date Noted  . Skin rash 10/03/2017  . CLL (chronic lymphocytic leukemia) ([place]) 12/09/2016  . General weakness 12/07/2016  . Smoker 12/07/2016  . Loss of weight 12/07/2016  . GERD  (gastroesophageal reflux disease) 12/30/2010  . Constipation 12/30/2010  . Hemorrhoids, external 12/30/2010    RTC with Dr Irene Limbo with labs in 2 months   All of the patients questions were answered with apparent satisfaction. The patient knows to call the clinic with any problems, questions or concerns.  The toal time spent in the appt was 30 minutes and more than 50% was on counseling and direct patient cares.    Sullivan Lone MD MS AAHIVMS [facility] Hematology/Oncology Physician [facility]  (Office):       [phone] (Work cell):  [phone] (Fax):           [phone]  11/03/2017 4:01 PM  I, Baldwin [place], am acting as a Education administrator for Dr Irene Limbo.   .I have reviewed the above documentation for accuracy and completeness, and I agree with the above. Brunetta Genera MD

## 2017-11-03 NOTE — Telephone Encounter (Signed)
Per in basket from Dr. Irene Limbo, called patient and attempted to get him to come in for labs/clinic visit this afternoon. Patient's wife stated that they are unable to come this afternoon. Will continue on working getting patient seen sooner than his July appointment due to need for prescription refills.

## 2017-11-03 NOTE — Telephone Encounter (Signed)
Appointments scheduled Letter/Calendar mailed to patient per 6/19 los

## 2017-11-03 NOTE — Telephone Encounter (Signed)
Spoke to patient's wife and they will be able to make it to appointment today with Dr. Irene Limbo at 3:20 with labs prior to appointment.

## 2017-11-26 ENCOUNTER — Other Ambulatory Visit: Payer: BLUE CROSS/BLUE SHIELD

## 2017-11-30 ENCOUNTER — Telehealth: Payer: Self-pay | Admitting: Pharmacist

## 2017-11-30 ENCOUNTER — Ambulatory Visit: Payer: BLUE CROSS/BLUE SHIELD | Admitting: Hematology

## 2017-11-30 NOTE — Telephone Encounter (Signed)
Oral Chemotherapy Pharmacist Encounter  Follow-Up Form  Spoke with patient's daughter, Brayton Layman, today to follow up regarding patient's oral chemotherapy medication: Imbruvica (ibrutinib) for the treatment of chronic lymphocytic leukemia  Original Start date of oral chemotherapy: 02/01/2017  Brayton Layman reports that her dad has missed 0 tablets/doses of Imbruvica 420mg  tablets, 1 tablets by mouth once daily, in the last month.  Brayton Layman states her dad takes his Imbruvica in the evening daily.  Monica reports the following side effects: bruising on legs. Brayton Layman denies other effects and states that her dad's energy level is great.  Pertinent labs reviewed: OK for continued treatment.  Other Issues:  Brayton Layman has questions about continued affordability of Imbruvica.  She states her dad was told that current copayment help for the Kate Sable will expire soon. Brayton Layman states her dad's insurance has not changed. Oral oncology clinic will research with the pharmacy current billing and assistance that may be used for patient's Imbruvica. We will follow-up with [facility] with any information.  Brayton Layman knows to call the office with questions or concerns. Oral Oncology Clinic will continue to follow.  Thank you,  Johny Drilling, PharmD, BCPS, BCOP  11/30/2017 12:23 PM Oral Oncology Clinic [phone]

## 2017-12-01 MED FILL — IMBRUVICA 420 MG TAB: 420 | 28 days supply | Qty: 28 | Fill #1

## 2017-12-30 ENCOUNTER — Telehealth: Payer: Self-pay

## 2017-12-30 NOTE — Telephone Encounter (Signed)
Oral Oncology Patient Advocate Encounter  Received notification from [facility] that the existing prior authorization for Imbruvica is due for renewal.  Renewal PA submitted by fax [phone] Status is pending  [facility] will continue to follow.  [place] Patient [place] Phone [phone] Fax [phone]

## 2017-12-31 MED FILL — IMBRUVICA 420 MG TAB: 420 | 28 days supply | Qty: 28 | Fill #2

## 2018-01-03 NOTE — Progress Notes (Signed)
HEMATOLOGY/ONCOLOGY CONSULTATION NOTE  Date of Service: 01/04/2018  Patient Care Team: Patient, No Pcp Per as PCP - General (General Practice)  CHIEF COMPLAINTS/PURPOSE OF CONSULTATION:  F/u for Chronic Lymphocytic Leukemia   Oncologic History:  [name] with CLL after a CLL FISH study revealed heterozygous 13q deletion within 92% cells. Subsequent imaging showed diffuse non-bulky lymphadenopathy in chest, abdomen, and pelvis with splenomegaly of 1200 cm^3. The pt began daily Ibrutinib 420mg  on 01/27/17 after becoming increasing fatigued and lost 15 pounds of weight over 4-5 months.   HISTORY OF PRESENTING ILLNESS:   Wesley Bright is a wonderful 67 y.o. male who has been previously seen by my colleague Dr Grace Isaac for evaluation and management of Chronic Lymphocytic Leukemia. He is accompanied today by his daughter and wife. The pt reports that he is doing well overall.   The pt reports that he is feeling better and his energy levels have been improving. He has continued to take Vitamin D, Vitamin B12 and co-enzyme Q. He notes no problems taking his Ibrutinib besides mild rashes that come and go without itching or bothering him very much.   He notes that he has had problems in the past with fluid in the abdomen, and has taken medication for this. He has also intermittently had constipation.   Most recent lab results (11/03/17) of CBC  is as follows: all values are WNL except for WBC at 30.3k, HGB at 12.3, HCT at 37.9, RDW at 15.0, PLT at 109k, Lymphs abs at 26.8k, Creatinine at 1.62, Total Protein at 6.1, Total bilirubin at <0.2, GFR at 43. LDH 6/19;19 is WNL at 164 Uric Acid 11/03/17 is WNL at 4.3  On review of systems, pt reports mild skin rashes, increasing energy levels, and denies mouth sores, fevers, chills, diarrhea, inguinal rashes, itching, vision changes, abdominal pains, leg swelling, and any other symptoms.   Interval History:   Wesley Bright  returns today for management and evaluation of his CLL. The patient's last visit with [place] was on 11/03/17. He is accompanied today by his wfie. The pt reports that he is doing well overall.   The pt reports that he has remained compliant with taking Ibrutinib and has had stable energy levels. He notes that his previous skin rashes have resolved, he has stopped Allopurinol, and he has not had any new skin rashes.   Lab results today (12/2017) of CBC w/diff, CMP, and Reticulocytes is as follows: all values are WNL except for WBC at 29.8k, MCHC at 31.5, RDW at 15.0, PLT at 118k, Lymphs abs at 24.5k, Creatinine at 1.51, Calcium at 8.4, Total Protein at 6.1, AST at 11, GFR at 47.  On review of systems, pt reports stable energy levels, stale weight, resolved skin rashes, eating well, and denies fevers, chills, night sweats, new skin rashes, mouth sores, abdominal pains, and any other symptoms.    MEDICAL HISTORY:  Past Medical History:  Diagnosis Date  . External hemorrhoid     SURGICAL HISTORY: Past Surgical History:  Procedure Laterality Date  . APPENDECTOMY    . INGUINAL HERNIA REPAIR     left  . KNEE SURGERY     Left Knee    SOCIAL HISTORY: Social History   Socioeconomic History  . Marital status: Married    Spouse name: Not on file  . Number of children: 2  . Years of education: Not on file  . Highest education level: Not on file  Occupational  History  . Occupation: Buyer, retail: [employer]  . Financial resource strain: Not on file  . Food insecurity:    Worry: Not on file    Inability: Not on file  . Transportation needs:    Medical: Not on file    Non-medical: Not on file  Tobacco Use  . Smoking status: Current Every Day Smoker    Packs/day: 0.50  . Smokeless tobacco: Never Used  Substance and Sexual Activity  . Alcohol use: Yes    Comment: occasional  . Drug use: No  . Sexual activity: Not on file  Lifestyle  . Physical  activity:    Days per week: Not on file    Minutes per session: Not on file  . Stress: Not on file  Relationships  . Social connections:    Talks on phone: Not on file    Gets together: Not on file    Attends religious service: Not on file    Active member of club or organization: Not on file    Attends meetings of clubs or organizations: Not on file    Relationship status: Not on file  . Intimate partner violence:    Fear of current or ex partner: Not on file    Emotionally abused: Not on file    Physically abused: Not on file    Forced sexual activity: Not on file  Other Topics Concern  . Not on file  Social History Narrative  . Not on file    FAMILY HISTORY: Family History  Problem Relation Age of Onset  . Liver cancer Brother   . Cancer Brother        Liver  . Esophageal cancer Father   . Cancer Father        Throat   . Cancer Mother        Bladder - cause of death    ALLERGIES:  has No Known Allergies.  MEDICATIONS:  Current Outpatient Medications  Medication Sig Dispense Refill  . cholecalciferol (VITAMIN D) 1000 units tablet Take 1,000 Units by mouth daily.    Marland Kitchen co-enzyme Q-10 30 MG capsule Take 30 mg by mouth 3 (three) times daily.    . Ibrutinib 420 MG TABS Take 420 mg by mouth daily. Administer orally with a glass of water at approximately the same time every day. 28 tablet 2  . vitamin B-12 (CYANOCOBALAMIN) 100 MCG tablet Take 100 mcg by mouth daily.     No current facility-administered medications for this visit.     REVIEW OF SYSTEMS:    A 10+ POINT REVIEW OF SYSTEMS WAS OBTAINED including neurology, dermatology, psychiatry, cardiac, respiratory, lymph, extremities, GI, GU, Musculoskeletal, constitutional, breasts, reproductive, HEENT.  All pertinent positives are noted in the HPI.  All others are negative.   PHYSICAL EXAMINATION: ECOG PERFORMANCE STATUS: 1 - Symptomatic but completely ambulatory  . Vitals:   01/04/18 1443  BP: 134/81  Pulse:  66  Resp: 17  Temp: 98.4 F (36.9 C)  SpO2: 99%   Filed Weights   01/04/18 1443  Weight: 193 lb 1.6 oz (87.6 kg)   .Body mass index is 27.71 kg/m.  GENERAL:alert, in no acute distress and comfortable SKIN: no acute rashes, no significant lesions EYES: conjunctiva are pink and non-injected, sclera anicteric OROPHARYNX: MMM, no exudates, no oropharyngeal erythema or ulceration NECK: supple, no JVD LYMPH:  no palpable lymphadenopathy in the cervical, axillary or inguinal regions LUNGS: clear to auscultation b/l  with normal respiratory effort HEART: regular rate & rhythm ABDOMEN:  normoactive bowel sounds , non tender, splenomegaly just palpable under left costal margin Extremity: no pedal edema PSYCH: alert & oriented x 3 with fluent speech NEURO: no focal motor/sensory deficits   LABORATORY DATA:  I have reviewed the data as listed  . CBC Latest Ref Rng & Units 01/04/2018 11/03/2017 09/27/2017  WBC 4.0 - 10.3 K/uL 29.8(H) 30.3(H) 57.1(HH)  Hemoglobin 13.0 - 17.1 g/dL 13.0 12.3(L) 12.9(L)  Hematocrit 38.4 - 49.9 % 41.4 37.9(L) 39.8  Platelets 140 - 400 K/uL 118(L) 109(L) 137(L)    . CMP Latest Ref Rng & Units 01/04/2018 11/03/2017 09/27/2017  Glucose 70 - 99 mg/dL 93 104 110  BUN 8 - 23 mg/dL 15 19 14   Creatinine 0.61 - 1.24 mg/dL 1.51(H) 1.62(H) 1.73(H)  Sodium 135 - 145 mmol/L 141 138 141  Potassium 3.5 - 5.1 mmol/L 4.3 3.8 4.1  Chloride 98 - 111 mmol/L 109 108 112(H)  CO2 22 - 32 mmol/L 24 23 23   Calcium 8.9 - 10.3 mg/dL 8.4(L) 8.4 8.5  Total Protein 6.5 - 8.1 g/dL 6.1(L) 6.1(L) 6.2(L)  Total Bilirubin 0.3 - 1.2 mg/dL 0.3 <0.2(L) 0.4  Alkaline Phos 38 - 126 U/L 92 107 87  AST 15 - 41 U/L 11(L) 11 13  ALT 0 - 44 U/L 9 <6 10   12/31/16 Flow Cytometry:    12/15/16 CLL FISH:    RADIOGRAPHIC STUDIES: I have personally reviewed the radiological images as listed and agreed with the findings in the report. No results found.  ASSESSMENT & PLAN:  67 y.o. male  with  1. Chronic Lymphocytic Leukemia - monoalleilic 58N deletion. Was started on Ibrutinib from 01/2017 due to fatigue, weight loss and significant splenomegaly.  2. Grade 1 intermittent rash ?related to [place] -Discussed pt labwork today, 01/04/18; WBC decreased to 29.8k, PLT stable at 118k, Lymphs abs decreased to 24.5k, blood chemistries are stable, anemia has resolved.  -The pt has no prohibitive toxicities from continuing Ibrutinib at this time. -recommended drinking 48-64 oz of water at least -Splenomegaly reduced on physical examination -Will see the pt back in 10 weeks, sooner if any new concerns   3.  Patient Active Problem List   Diagnosis Date Noted  . Skin rash 10/03/2017  . CLL (chronic lymphocytic leukemia) ([place]) 12/09/2016  . General weakness 12/07/2016  . Smoker 12/07/2016  . Loss of weight 12/07/2016  . GERD (gastroesophageal reflux disease) 12/30/2010  . Constipation 12/30/2010  . Hemorrhoids, external 12/30/2010    RTC with Dr Irene Limbo in 10 weeks with labs    All of the patients questions were answered with apparent satisfaction. The patient knows to call the clinic with any problems, questions or concerns.  The total time spent in the appt was 25 minutes and more than 50% was on counseling and direct patient cares.    Sullivan Lone MD MS AAHIVMS [facility] Hematology/Oncology Physician [facility]  (Office):       [phone] (Work cell):  [phone] (Fax):           [phone]  01/04/2018 3:35 PM  I, Baldwin Jamaica, am acting as a scribe for Dr. Irene Limbo  .I have reviewed the above documentation for accuracy and completeness, and I agree with the above. Brunetta Genera MD

## 2018-01-04 ENCOUNTER — Telehealth: Payer: Self-pay

## 2018-01-04 ENCOUNTER — Encounter: Payer: Self-pay | Admitting: Hematology

## 2018-01-04 ENCOUNTER — Inpatient Hospital Stay (HOSPITAL_BASED_OUTPATIENT_CLINIC_OR_DEPARTMENT_OTHER): Payer: BLUE CROSS/BLUE SHIELD | Admitting: Hematology

## 2018-01-04 ENCOUNTER — Inpatient Hospital Stay: Payer: BLUE CROSS/BLUE SHIELD | Attending: Hematology and Oncology

## 2018-01-04 VITALS — BP 134/81 | HR 66 | Temp 98.4°F | Resp 17 | Ht 70.0 in | Wt 193.1 lb

## 2018-01-04 DIAGNOSIS — K5900 Constipation, unspecified: Secondary | ICD-10-CM | POA: Diagnosis not present

## 2018-01-04 DIAGNOSIS — C9110 Chronic lymphocytic leukemia of B-cell type not having achieved remission: Secondary | ICD-10-CM | POA: Insufficient documentation

## 2018-01-04 DIAGNOSIS — R161 Splenomegaly, not elsewhere classified: Secondary | ICD-10-CM | POA: Insufficient documentation

## 2018-01-04 DIAGNOSIS — D696 Thrombocytopenia, unspecified: Secondary | ICD-10-CM

## 2018-01-04 LAB — CBC WITH DIFFERENTIAL/PLATELET
BASOS PCT: 0 %
Basophils Absolute: 0.1 10*3/uL (ref 0.0–0.1)
EOS ABS: 0.1 10*3/uL (ref 0.0–0.5)
Eosinophils Relative: 0 %
HCT: 41.4 % (ref 38.4–49.9)
Hemoglobin: 13 g/dL (ref 13.0–17.1)
LYMPHS ABS: 24.5 10*3/uL — AB (ref 0.9–3.3)
Lymphocytes Relative: 83 %
MCH: 28.9 pg (ref 27.2–33.4)
MCHC: 31.5 g/dL — AB (ref 32.0–36.0)
MCV: 91.7 fL (ref 79.3–98.0)
MONO ABS: 0.7 10*3/uL (ref 0.1–0.9)
MONOS PCT: 2 %
Neutro Abs: 4.5 10*3/uL (ref 1.5–6.5)
Neutrophils Relative %: 15 %
Platelets: 118 10*3/uL — ABNORMAL LOW (ref 140–400)
RBC: 4.51 MIL/uL (ref 4.20–5.82)
RDW: 15 % — AB (ref 11.0–14.6)
WBC: 29.8 10*3/uL — ABNORMAL HIGH (ref 4.0–10.3)

## 2018-01-04 LAB — CMP (CANCER CENTER ONLY)
ALBUMIN: 3.5 g/dL (ref 3.5–5.0)
ALK PHOS: 92 U/L (ref 38–126)
ALT: 9 U/L (ref 0–44)
ANION GAP: 8 (ref 5–15)
AST: 11 U/L — AB (ref 15–41)
BUN: 15 mg/dL (ref 8–23)
CALCIUM: 8.4 mg/dL — AB (ref 8.9–10.3)
CO2: 24 mmol/L (ref 22–32)
CREATININE: 1.51 mg/dL — AB (ref 0.61–1.24)
Chloride: 109 mmol/L (ref 98–111)
GFR, Est AFR Am: 54 mL/min — ABNORMAL LOW (ref >60)
GFR, Estimated: 47 mL/min — ABNORMAL LOW (ref >60)
GLUCOSE: 93 mg/dL (ref 70–99)
Potassium: 4.3 mmol/L (ref 3.5–5.1)
Sodium: 141 mmol/L (ref 135–145)
Total Bilirubin: 0.3 mg/dL (ref 0.3–1.2)
Total Protein: 6.1 g/dL — ABNORMAL LOW (ref 6.5–8.1)

## 2018-01-04 LAB — LACTATE DEHYDROGENASE: LDH: 131 U/L (ref 98–192)

## 2018-01-04 MED ORDER — IBRUTINIB 420 MG PO TABS: 420.0000 mg | ORAL_TABLET | Freq: Every day | ORAL | 2 refills | Status: DC

## 2018-01-04 NOTE — Telephone Encounter (Signed)
Printed avs and calender of upcoming. Per 8/20 los

## 2018-01-04 NOTE — Telephone Encounter (Signed)
Oral Oncology Patient Advocate Encounter  Prior Authorization for Wesley Bright has been approved.    Effective dates: 01/03/18 through 01/04/19  [facility] [place] ph # [phone]  Oral Oncology Clinic will continue to follow.   [name] [name] Phone [phone] Fax [phone]

## 2018-01-19 ENCOUNTER — Encounter: Payer: Self-pay | Admitting: Pharmacist

## 2018-01-28 MED FILL — IMBRUVICA 420 MG TAB: 420 | 28 days supply | Qty: 28 | Fill #0

## 2018-02-17 ENCOUNTER — Telehealth: Payer: Self-pay

## 2018-02-17 ENCOUNTER — Encounter: Payer: Self-pay | Admitting: Hematology

## 2018-02-17 MED FILL — IMBRUVICA 420 MG TAB: 420 | 28 days supply | Qty: 28 | Fill #1

## 2018-02-17 NOTE — Telephone Encounter (Signed)
Oral Oncology Patient Advocate Encounter  Wesley Bright and Wesley Bright came into the cancer center and asked to speak to me. They explained that he no longer had prescription insurance and needed help getting patient assistance for Wesley Imbruvica. Their daughter had already looked into it and printed them a [employer], I had him sign it while he was here. Later, they came back with the tax return that [employer] will need. I put the doctors portion in the folder to sign and put refills if desired.  Will continue to update  [place] Patient [name] Phone [phone] Fax [phone]

## 2018-02-21 NOTE — Telephone Encounter (Signed)
Oral Oncology Patient Advocate Encounter  I faxed the [employer] application 14/1/59.  Will update with final determination  [place] Patient [place] Phone [phone] Fax [phone]

## 2018-02-23 NOTE — Telephone Encounter (Signed)
Oral Oncology Patient Advocate Encounter  Wynetta Emery and Wynetta Emery has approved Imbruvica 02/23/18-02/24/2019. [employer] will call the patient to schedule shipment of the [place].  I called Brayton Layman (daughter) and left her a message to inform her of this.   [place] Patient [place] Phone [phone] Fax [phone]

## 2018-03-10 ENCOUNTER — Telehealth: Payer: Self-pay | Admitting: Hematology

## 2018-03-10 NOTE — Telephone Encounter (Signed)
Gave avs and calendar ° °

## 2018-03-15 ENCOUNTER — Ambulatory Visit: Payer: BLUE CROSS/BLUE SHIELD | Admitting: Hematology

## 2018-03-15 ENCOUNTER — Other Ambulatory Visit: Payer: BLUE CROSS/BLUE SHIELD

## 2018-03-18 MED FILL — IMBRUVICA 420 MG TAB: 420 | 28 days supply | Qty: 28 | Fill #2

## 2018-04-11 ENCOUNTER — Other Ambulatory Visit: Payer: Self-pay | Admitting: Hematology

## 2018-04-12 NOTE — Progress Notes (Signed)
HEMATOLOGY/ONCOLOGY CLINIC NOTE  Date of Service: 04/13/2018  Patient Care Team: Patient, No Pcp Per as PCP - General (General Practice)  CHIEF COMPLAINTS/PURPOSE OF CONSULTATION:  F/u for Chronic Lymphocytic Leukemia   Oncologic History:  [name] with CLL after a CLL FISH study revealed heterozygous 13q deletion within 92% cells. Subsequent imaging showed diffuse non-bulky lymphadenopathy in chest, abdomen, and pelvis with splenomegaly of 1200 cm^3. The pt began daily Ibrutinib 420mg  on 01/27/17 after becoming increasing fatigued and lost 15 pounds of weight over 4-5 months.   HISTORY OF PRESENTING ILLNESS:   Wesley Bright is a wonderful 67 y.o. male who has been previously seen by my colleague Dr Grace Isaac for evaluation and management of Chronic Lymphocytic Leukemia. He is accompanied today by his daughter and wife. The pt reports that he is doing well overall.   The pt reports that he is feeling better and his energy levels have been improving. He has continued to take Vitamin D, Vitamin B12 and co-enzyme Q. He notes no problems taking his Ibrutinib besides mild rashes that come and go without itching or bothering him very much.   He notes that he has had problems in the past with fluid in the abdomen, and has taken medication for this. He has also intermittently had constipation.   Most recent lab results (11/03/17) of CBC  is as follows: all values are WNL except for WBC at 30.3k, HGB at 12.3, HCT at 37.9, RDW at 15.0, PLT at 109k, Lymphs abs at 26.8k, Creatinine at 1.62, Total Protein at 6.1, Total bilirubin at <0.2, GFR at 43. LDH 6/19;19 is WNL at 164 Uric Acid 11/03/17 is WNL at 4.3  On review of systems, pt reports mild skin rashes, increasing energy levels, and denies mouth sores, fevers, chills, diarrhea, inguinal rashes, itching, vision changes, abdominal pains, leg swelling, and any other symptoms.   Interval History:   Wesley Bright  returns today for management and evaluation of his CLL. The patient's last visit with [place] was on 01/04/18. He is accompanied today by his wife. The pt reports that he is doing well overall.   The pt notes that he has not developed any new concerns in the interim. He notes that he has continued to take Ibrutinib each day, and denies any problems taking this medication.   Lab results today (04/13/18) of CBC w/diff and CMP is as follows: all values are WNL except for WBC at 32.4k, PLT at 119k, Lymphs abs at 27.4k, Creatinine at 1.64, Calcium at 8.7, Total Protein at 6.4, AST at 13, GFR at 43. 04/13/18 LDH is pending   On review of systems, pt reports stable energy levels, eating well, weight gain, and denies unexpected weight loss, fevers, chills, night sweats, unexpected weight loss, abdominal pains, leg swelling, and any other symptoms.   MEDICAL HISTORY:  Past Medical History:  Diagnosis Date  . External hemorrhoid     SURGICAL HISTORY: Past Surgical History:  Procedure Laterality Date  . APPENDECTOMY    . INGUINAL HERNIA REPAIR     left  . KNEE SURGERY     Left Knee    SOCIAL HISTORY: Social History   Socioeconomic History  . Marital status: Married    Spouse name: Not on file  . Number of children: 2  . Years of education: Not on file  . Highest education level: Not on file  Occupational History  . Occupation: Buyer, retail: [employer]  Social Needs  . Financial resource strain: Not on file  . Food insecurity:    Worry: Not on file    Inability: Not on file  . Transportation needs:    Medical: Not on file    Non-medical: Not on file  Tobacco Use  . Smoking status: Current Every Day Smoker    Packs/day: 0.50  . Smokeless tobacco: Never Used  Substance and Sexual Activity  . Alcohol use: Yes    Comment: occasional  . Drug use: No  . Sexual activity: Not on file  Lifestyle  . Physical activity:    Days per week: Not on file    Minutes per  session: Not on file  . Stress: Not on file  Relationships  . Social connections:    Talks on phone: Not on file    Gets together: Not on file    Attends religious service: Not on file    Active member of club or organization: Not on file    Attends meetings of clubs or organizations: Not on file    Relationship status: Not on file  . Intimate partner violence:    Fear of current or ex partner: Not on file    Emotionally abused: Not on file    Physically abused: Not on file    Forced sexual activity: Not on file  Other Topics Concern  . Not on file  Social History Narrative  . Not on file    FAMILY HISTORY: Family History  Problem Relation Age of Onset  . Liver cancer Brother   . Cancer Brother        Liver  . Esophageal cancer Father   . Cancer Father        Throat   . Cancer Mother        Bladder - cause of death    ALLERGIES:  has No Known Allergies.  MEDICATIONS:  Current Outpatient Medications  Medication Sig Dispense Refill  . cholecalciferol (VITAMIN D) 1000 units tablet Take 1,000 Units by mouth daily.    Marland Kitchen co-enzyme Q-10 30 MG capsule Take 30 mg by mouth 3 (three) times daily.    . IMBRUVICA 420 MG TABS TAKE1 TABLET (420 MG) BY MOUTH DAILY. ADMINISTER ORALLY WITH A GLASS OF WATER AT APPROXIMATELY THE SAME TIME EVERY DAY. 28 tablet 2  . vitamin B-12 (CYANOCOBALAMIN) 100 MCG tablet Take 100 mcg by mouth daily.     No current facility-administered medications for this visit.     REVIEW OF SYSTEMS:    A 10+ POINT REVIEW OF SYSTEMS WAS OBTAINED including neurology, dermatology, psychiatry, cardiac, respiratory, lymph, extremities, GI, GU, Musculoskeletal, constitutional, breasts, reproductive, HEENT.  All pertinent positives are noted in the HPI.  All others are negative.   PHYSICAL EXAMINATION: ECOG PERFORMANCE STATUS: 1 - Symptomatic but completely ambulatory  Vitals:   04/13/18 1320  BP: (!) 150/84  Pulse: 70  Resp: 18  Temp: 98.1 F (36.7 C)  SpO2:  100%   Filed Weights   04/13/18 1320  Weight: 201 lb 3.2 oz (91.3 kg)   .Body mass index is 28.87 kg/m.  GENERAL:alert, in no acute distress and comfortable SKIN: no acute rashes, no significant lesions EYES: conjunctiva are pink and non-injected, sclera anicteric OROPHARYNX: MMM, no exudates, no oropharyngeal erythema or ulceration NECK: supple, no JVD LYMPH:  no palpable lymphadenopathy in the cervical, axillary or inguinal regions LUNGS: clear to auscultation b/l with normal respiratory effort HEART: regular rate & rhythm  ABDOMEN:  normoactive bowel sounds , non tender, not distended, splenomegaly just palpable under left costal margin Extremity: no pedal edema PSYCH: alert & oriented x 3 with fluent speech NEURO: no focal motor/sensory deficits   LABORATORY DATA:  I have reviewed the data as listed  . CBC Latest Ref Rng & Units 04/13/2018 01/04/2018 11/03/2017  WBC 4.0 - 10.5 K/uL 32.4(H) 29.8(H) 30.3(H)  Hemoglobin 13.0 - 17.0 g/dL 13.9 13.0 12.3(L)  Hematocrit 39.0 - 52.0 % 43.5 41.4 37.9(L)  Platelets 150 - 400 K/uL 119(L) 118(L) 109(L)    . CMP Latest Ref Rng & Units 04/13/2018 01/04/2018 11/03/2017  Glucose 70 - 99 mg/dL 82 93 104  BUN 8 - 23 mg/dL 16 15 19   Creatinine 0.61 - 1.24 mg/dL 1.64(H) 1.51(H) 1.62(H)  Sodium 135 - 145 mmol/L 144 141 138  Potassium 3.5 - 5.1 mmol/L 4.3 4.3 3.8  Chloride 98 - 111 mmol/L 110 109 108  CO2 22 - 32 mmol/L 25 24 23   Calcium 8.9 - 10.3 mg/dL 8.7(L) 8.4(L) 8.4  Total Protein 6.5 - 8.1 g/dL 6.4(L) 6.1(L) 6.1(L)  Total Bilirubin 0.3 - 1.2 mg/dL 0.5 0.3 <0.2(L)  Alkaline Phos 38 - 126 U/L 88 92 107  AST 15 - 41 U/L 13(L) 11(L) 11  ALT 0 - 44 U/L 11 9 <6   12/31/16 Flow Cytometry:    12/15/16 CLL FISH:    RADIOGRAPHIC STUDIES: I have personally reviewed the radiological images as listed and agreed with the findings in the report. No results found.  ASSESSMENT & PLAN:  67 y.o. male with  1. Chronic Lymphocytic Leukemia -  monoalleilic 37T deletion. Was started on Ibrutinib from 01/2017 due to fatigue, weight loss and significant splenomegaly.  2. Grade 1 intermittent rash ?related to [place] -Discussed pt labwork today, 04/13/18; Lymphs abs stable at 27.4k, slightly increased from 3 months ago. No anemia. Mild thrombocytopenia with PLT at 119k -Discussed that the response from Ibrutinib has slowed, and suggest possibility additional treatment with Rituxan. He prefers to consider this further in the interim.  -Provided supplemental information about Rituxan -The pt has no prohibitive toxicities from continuing Ibrutinib at this time. -Splenomegaly reduced on physical examination -Will see the pt back in 3 months   3.  Patient Active Problem List   Diagnosis Date Noted  . Skin rash 10/03/2017  . CLL (chronic lymphocytic leukemia) ([place]) 12/09/2016  . General weakness 12/07/2016  . Smoker 12/07/2016  . Loss of weight 12/07/2016  . GERD (gastroesophageal reflux disease) 12/30/2010  . Constipation 12/30/2010  . Hemorrhoids, external 12/30/2010    RTC with Dr Irene Limbo with labs in 3 months    All of the patients questions were answered with apparent satisfaction. The patient knows to call the clinic with any problems, questions or concerns.  The total time spent in the appt was 25 minutes and more than 50% was on counseling and direct patient cares.     Sullivan Lone MD MS AAHIVMS [facility] Hematology/Oncology Physician [facility]  (Office):       [phone] (Work cell):  [phone] (Fax):           [phone]  04/13/2018 2:15 PM  I, Baldwin Jamaica, am acting as a scribe for Dr. Sullivan Lone.   .I have reviewed the above documentation for accuracy and completeness, and I agree with the above. Brunetta Genera MD

## 2018-04-13 ENCOUNTER — Inpatient Hospital Stay (HOSPITAL_BASED_OUTPATIENT_CLINIC_OR_DEPARTMENT_OTHER): Payer: Medicare Other | Admitting: Hematology

## 2018-04-13 ENCOUNTER — Other Ambulatory Visit: Payer: Self-pay | Admitting: *Deleted

## 2018-04-13 ENCOUNTER — Inpatient Hospital Stay: Payer: Medicare Other | Attending: Hematology and Oncology

## 2018-04-13 ENCOUNTER — Telehealth: Payer: Self-pay | Admitting: Hematology

## 2018-04-13 VITALS — BP 150/84 | HR 70 | Temp 98.1°F | Resp 18 | Ht 70.0 in | Wt 201.2 lb

## 2018-04-13 DIAGNOSIS — D696 Thrombocytopenia, unspecified: Secondary | ICD-10-CM

## 2018-04-13 DIAGNOSIS — R161 Splenomegaly, not elsewhere classified: Secondary | ICD-10-CM | POA: Insufficient documentation

## 2018-04-13 DIAGNOSIS — R21 Rash and other nonspecific skin eruption: Secondary | ICD-10-CM | POA: Diagnosis not present

## 2018-04-13 DIAGNOSIS — C9110 Chronic lymphocytic leukemia of B-cell type not having achieved remission: Secondary | ICD-10-CM

## 2018-04-13 DIAGNOSIS — K5900 Constipation, unspecified: Secondary | ICD-10-CM | POA: Insufficient documentation

## 2018-04-13 LAB — CMP (CANCER CENTER ONLY)
ALBUMIN: 3.6 g/dL (ref 3.5–5.0)
ALK PHOS: 88 U/L (ref 38–126)
ALT: 11 U/L (ref 0–44)
ANION GAP: 9 (ref 5–15)
AST: 13 U/L — ABNORMAL LOW (ref 15–41)
BILIRUBIN TOTAL: 0.5 mg/dL (ref 0.3–1.2)
BUN: 16 mg/dL (ref 8–23)
CALCIUM: 8.7 mg/dL — AB (ref 8.9–10.3)
CO2: 25 mmol/L (ref 22–32)
Chloride: 110 mmol/L (ref 98–111)
Creatinine: 1.64 mg/dL — ABNORMAL HIGH (ref 0.61–1.24)
GFR, EST AFRICAN AMERICAN: 50 mL/min — AB (ref >60)
GFR, Estimated: 43 mL/min — ABNORMAL LOW (ref >60)
Glucose, Bld: 82 mg/dL (ref 70–99)
POTASSIUM: 4.3 mmol/L (ref 3.5–5.1)
SODIUM: 144 mmol/L (ref 135–145)
TOTAL PROTEIN: 6.4 g/dL — AB (ref 6.5–8.1)

## 2018-04-13 LAB — CBC WITH DIFFERENTIAL/PLATELET
ABS IMMATURE GRANULOCYTES: 0.03 10*3/uL (ref 0.00–0.07)
BASOS ABS: 0.1 10*3/uL (ref 0.0–0.1)
BASOS PCT: 0 %
EOS ABS: 0.1 10*3/uL (ref 0.0–0.5)
Eosinophils Relative: 0 %
HCT: 43.5 % (ref 39.0–52.0)
Hemoglobin: 13.9 g/dL (ref 13.0–17.0)
IMMATURE GRANULOCYTES: 0 %
Lymphocytes Relative: 86 %
Lymphs Abs: 27.4 10*3/uL — ABNORMAL HIGH (ref 0.7–4.0)
MCH: 28.8 pg (ref 26.0–34.0)
MCHC: 32 g/dL (ref 30.0–36.0)
MCV: 90.1 fL (ref 80.0–100.0)
Monocytes Absolute: 0.7 10*3/uL (ref 0.1–1.0)
Monocytes Relative: 2 %
NEUTROS ABS: 4 10*3/uL (ref 1.7–7.7)
NEUTROS PCT: 12 %
NRBC: 0 % (ref 0.0–0.2)
PLATELETS: 119 10*3/uL — AB (ref 150–400)
RBC: 4.83 MIL/uL (ref 4.22–5.81)
RDW: 14.3 % (ref 11.5–15.5)
WBC: 32.4 10*3/uL — AB (ref 4.0–10.5)

## 2018-04-13 LAB — LACTATE DEHYDROGENASE: LDH: 142 U/L (ref 98–192)

## 2018-04-13 NOTE — Telephone Encounter (Signed)
Gave pt avs and calendar  °

## 2018-04-15 ENCOUNTER — Telehealth: Payer: Self-pay | Admitting: *Deleted

## 2018-04-15 NOTE — Telephone Encounter (Signed)
Faxed RX for Imbruvica to [facility] [phone]. Fax confirmation received.

## 2018-04-15 NOTE — Telephone Encounter (Signed)
-----   Message from Jolayne Haines, CPhT sent at 04/11/2018 11:08 AM EST ----- Wesley Bright is requesting a new Rx to be faxed to [phone]  Thank you

## 2018-05-03 DIAGNOSIS — H04123 Dry eye syndrome of bilateral lacrimal glands: Secondary | ICD-10-CM | POA: Diagnosis not present

## 2018-07-12 NOTE — Progress Notes (Signed)
HEMATOLOGY/ONCOLOGY CLINIC NOTE  Date of Service: 07/13/2018  Patient Care Team: Patient, No Pcp Per as PCP - General (General Practice)  CHIEF COMPLAINTS/PURPOSE OF CONSULTATION:  F/u for Chronic Lymphocytic Leukemia   Oncologic History:  Wesley Bright was initially diagnosed with CLL after a CLL FISH study revealed heterozygous 13q deletion within 92% cells. Subsequent imaging showed diffuse non-bulky lymphadenopathy in chest, abdomen, and pelvis with splenomegaly of 1200 cm^3. The pt began daily Ibrutinib 420mg  on 01/27/17 after becoming increasing fatigued and lost 15 pounds of weight over 4-5 months.   HISTORY OF PRESENTING ILLNESS:   Wesley Bright is a wonderful 68 y.o. male who has been previously seen by my colleague Dr Grace Isaac for evaluation and management of Chronic Lymphocytic Leukemia. He is accompanied today by his daughter and wife. The pt reports that he is doing well overall.   The pt reports that he is feeling better and his energy levels have been improving. He has continued to take Vitamin D, Vitamin B12 and co-enzyme Q. He notes no problems taking his Ibrutinib besides mild rashes that come and go without itching or bothering him very much.   He notes that he has had problems in the past with fluid in the abdomen, and has taken medication for this. He has also intermittently had constipation.   Most recent lab results (11/03/17) of CBC  is as follows: all values are WNL except for WBC at 30.3k, HGB at 12.3, HCT at 37.9, RDW at 15.0, PLT at 109k, Lymphs abs at 26.8k, Creatinine at 1.62, Total Protein at 6.1, Total bilirubin at <0.2, GFR at 43. LDH 6/19;19 is WNL at 164 Uric Acid 11/03/17 is WNL at 4.3  On review of systems, pt reports mild skin rashes, increasing energy levels, and denies mouth sores, fevers, chills, diarrhea, inguinal rashes, itching, vision changes, abdominal pains, leg swelling, and any other symptoms.   Interval History:   Wesley Bright  returns today for management and evaluation of his CLL. The patient's last visit with [place] was on 04/13/18. He is accompanied today by his wife. The pt reports that he is doing well overall.   The pt reports that he enjoyed his holidays very much and was able to travel internationally to visit family. The pt notes that he continues tolerating Ibrutinib well. He denies fatigue, constitutional symptoms or abdominal pains. He notes that he is eating well and has gained a little weight  Lab results today (07/13/18) of CBC w/diff and CMP is as follows: all values are WNL except for WBC at 21.1k, PLT at 99k, Lymphs abs at 17.1k, Creatinine at 1.75, Calcium at 8.4, Total Protein at 6.3, AST at 12, GFR at 40. 07/13/18 LDH is . Lab Results  Component Value Date   LDH 119 07/13/2018   On review of systems, pt reports good energy levels, eating well, and denies fatigue, fevers, chills, night sweats, noticing any new lumps or bumps, abdominal pains, recent infections, unexpected weight loss, and any other symptoms.   MEDICAL HISTORY:  Past Medical History:  Diagnosis Date  . External hemorrhoid     SURGICAL HISTORY: Past Surgical History:  Procedure Laterality Date  . APPENDECTOMY    . INGUINAL HERNIA REPAIR     left  . KNEE SURGERY     Left Knee    SOCIAL HISTORY: Social History   Socioeconomic History  . Marital status: Married    Spouse name: Not on file  . Number of children:  2  . Years of education: Not on file  . Highest education level: Not on file  Occupational History  . Occupation: Buyer, retail: [employer]  . Financial resource strain: Not on file  . Food insecurity:    Worry: Not on file    Inability: Not on file  . Transportation needs:    Medical: Not on file    Non-medical: Not on file  Tobacco Use  . Smoking status: Current Every Day Smoker    Packs/day: 0.50  . Smokeless tobacco: Never Used  Substance and Sexual Activity  .  Alcohol use: Yes    Comment: occasional  . Drug use: No  . Sexual activity: Not on file  Lifestyle  . Physical activity:    Days per week: Not on file    Minutes per session: Not on file  . Stress: Not on file  Relationships  . Social connections:    Talks on phone: Not on file    Gets together: Not on file    Attends religious service: Not on file    Active member of club or organization: Not on file    Attends meetings of clubs or organizations: Not on file    Relationship status: Not on file  . Intimate partner violence:    Fear of current or ex partner: Not on file    Emotionally abused: Not on file    Physically abused: Not on file    Forced sexual activity: Not on file  Other Topics Concern  . Not on file  Social History Narrative  . Not on file    FAMILY HISTORY: Family History  Problem Relation Age of Onset  . Liver cancer Brother   . Cancer Brother        Liver  . Esophageal cancer Father   . Cancer Father        Throat   . Cancer Mother        Bladder - cause of death    ALLERGIES:  has No Known Allergies.  MEDICATIONS:  Current Outpatient Medications  Medication Sig Dispense Refill  . cholecalciferol (VITAMIN D) 1000 units tablet Take 1,000 Units by mouth daily.    Marland Kitchen co-enzyme Q-10 30 MG capsule Take 30 mg by mouth 3 (three) times daily.    . IMBRUVICA 420 MG TABS TAKE1 TABLET (420 MG) BY MOUTH DAILY. ADMINISTER ORALLY WITH A GLASS OF WATER AT APPROXIMATELY THE SAME TIME EVERY DAY. 28 tablet 2  . vitamin B-12 (CYANOCOBALAMIN) 100 MCG tablet Take 100 mcg by mouth daily.     No current facility-administered medications for this visit.     REVIEW OF SYSTEMS:    A 10+ POINT REVIEW OF SYSTEMS WAS OBTAINED including neurology, dermatology, psychiatry, cardiac, respiratory, lymph, extremities, GI, GU, Musculoskeletal, constitutional, breasts, reproductive, HEENT.  All pertinent positives are noted in the HPI.  All others are negative.   PHYSICAL  EXAMINATION: ECOG PERFORMANCE STATUS: 1 - Symptomatic but completely ambulatory  Vitals:   07/13/18 1513  BP: (!) 150/83  Pulse: (!) 57  Resp: 18  Temp: 98.6 F (37 C)  SpO2: 99%   Filed Weights   07/13/18 1513  Weight: 203 lb (92.1 kg)   .Body mass index is 29.13 kg/m.  GENERAL:alert, in no acute distress and comfortable SKIN: no acute rashes, no significant lesions EYES: conjunctiva are pink and non-injected, sclera anicteric OROPHARYNX: MMM, no exudates, no oropharyngeal erythema or ulceration NECK: supple,  no JVD LYMPH:  no palpable lymphadenopathy in the cervical, axillary or inguinal regions LUNGS: clear to auscultation b/l with normal respiratory effort HEART: regular rate & rhythm ABDOMEN:  normoactive bowel sounds , non tender, not distended. No palpable hepatosplenomegaly Extremity: no pedal edema PSYCH: alert & oriented x 3 with fluent speech NEURO: no focal motor/sensory deficits   LABORATORY DATA:  I have reviewed the data as listed  . CBC Latest Ref Rng & Units 07/13/2018 04/13/2018 01/04/2018  WBC 4.0 - 10.5 K/uL 21.1(H) 32.4(H) 29.8(H)  Hemoglobin 13.0 - 17.0 g/dL 14.0 13.9 13.0  Hematocrit 39.0 - 52.0 % 43.0 43.5 41.4  Platelets 150 - 400 K/uL 99(L) 119(L) 118(L)    . CMP Latest Ref Rng & Units 07/13/2018 04/13/2018 01/04/2018  Glucose 70 - 99 mg/dL 91 82 93  BUN 8 - 23 mg/dL 17 16 15   Creatinine 0.61 - 1.24 mg/dL 1.75(H) 1.64(H) 1.51(H)  Sodium 135 - 145 mmol/L 141 144 141  Potassium 3.5 - 5.1 mmol/L 4.1 4.3 4.3  Chloride 98 - 111 mmol/L 111 110 109  CO2 22 - 32 mmol/L 22 25 24   Calcium 8.9 - 10.3 mg/dL 8.4(L) 8.7(L) 8.4(L)  Total Protein 6.5 - 8.1 g/dL 6.3(L) 6.4(L) 6.1(L)  Total Bilirubin 0.3 - 1.2 mg/dL 0.4 0.5 0.3  Alkaline Phos 38 - 126 U/L 91 88 92  AST 15 - 41 U/L 12(L) 13(L) 11(L)  ALT 0 - 44 U/L 12 11 9    12/31/16 Flow Cytometry:    12/15/16 CLL FISH:    RADIOGRAPHIC STUDIES: I have personally reviewed the radiological images as  listed and agreed with the findings in the report. No results found.  ASSESSMENT & PLAN:  68 y.o. male with  1. Chronic Lymphocytic Leukemia - monoalleilic 03B deletion. Was started on Ibrutinib from 01/2017 due to fatigue, weight loss and significant splenomegaly.  2. H/o Grade 1 intermittent rash ?related to Ibrutinib - no currently an issue.  PLAN -Discussed pt labwork today, 07/13/18; WBC improved to 21.1k, PLT holding at 99k, no anemia -The pt has no prohibitive toxicities from continuing Ibrutinib at this time. -Splenomegaly resolved on physical examination -Will see the pt back in 3 months  3.  Patient Active Problem List   Diagnosis Date Noted  . Skin rash 10/03/2017  . CLL (chronic lymphocytic leukemia) ([place]) 12/09/2016  . General weakness 12/07/2016  . Smoker 12/07/2016  . Loss of weight 12/07/2016  . GERD (gastroesophageal reflux disease) 12/30/2010  . Constipation 12/30/2010  . Hemorrhoids, external 12/30/2010    RTC with Dr Irene Limbo with labs in 3 months    All of the patients questions were answered with apparent satisfaction. The patient knows to call the clinic with any problems, questions or concerns.  The total time spent in the appt was 20 minutes and more than 50% was on counseling and direct patient cares.    Sullivan Lone MD MS AAHIVMS [facility] Hematology/Oncology Physician [facility]  (Office):       [phone] (Work cell):  [phone] (Fax):           [phone]  07/13/2018 3:53 PM  I, Baldwin Jamaica, am acting as a scribe for Dr. Sullivan Lone.   .I have reviewed the above documentation for accuracy and completeness, and I agree with the above. Brunetta Genera MD

## 2018-07-13 ENCOUNTER — Inpatient Hospital Stay (HOSPITAL_BASED_OUTPATIENT_CLINIC_OR_DEPARTMENT_OTHER): Payer: PPO | Admitting: Hematology

## 2018-07-13 ENCOUNTER — Telehealth: Payer: Self-pay | Admitting: Hematology

## 2018-07-13 ENCOUNTER — Inpatient Hospital Stay: Payer: PPO | Attending: Hematology and Oncology

## 2018-07-13 VITALS — BP 150/83 | HR 57 | Temp 98.6°F | Resp 18 | Ht 70.0 in | Wt 203.0 lb

## 2018-07-13 DIAGNOSIS — C9110 Chronic lymphocytic leukemia of B-cell type not having achieved remission: Secondary | ICD-10-CM

## 2018-07-13 LAB — CBC WITH DIFFERENTIAL/PLATELET
Abs Immature Granulocytes: 0.03 10*3/uL (ref 0.00–0.07)
Basophils Absolute: 0.1 10*3/uL (ref 0.0–0.1)
Basophils Relative: 0 %
EOS ABS: 0.1 10*3/uL (ref 0.0–0.5)
Eosinophils Relative: 0 %
HCT: 43 % (ref 39.0–52.0)
Hemoglobin: 14 g/dL (ref 13.0–17.0)
Immature Granulocytes: 0 %
Lymphocytes Relative: 82 %
Lymphs Abs: 17.1 10*3/uL — ABNORMAL HIGH (ref 0.7–4.0)
MCH: 29.7 pg (ref 26.0–34.0)
MCHC: 32.6 g/dL (ref 30.0–36.0)
MCV: 91.1 fL (ref 80.0–100.0)
Monocytes Absolute: 0.5 10*3/uL (ref 0.1–1.0)
Monocytes Relative: 2 %
NEUTROS PCT: 16 %
NRBC: 0 % (ref 0.0–0.2)
Neutro Abs: 3.3 10*3/uL (ref 1.7–7.7)
Platelets: 99 10*3/uL — ABNORMAL LOW (ref 150–400)
RBC: 4.72 MIL/uL (ref 4.22–5.81)
RDW: 14.1 % (ref 11.5–15.5)
WBC: 21.1 10*3/uL — ABNORMAL HIGH (ref 4.0–10.5)

## 2018-07-13 LAB — CMP (CANCER CENTER ONLY)
ALT: 12 U/L (ref 0–44)
AST: 12 U/L — ABNORMAL LOW (ref 15–41)
Albumin: 3.6 g/dL (ref 3.5–5.0)
Alkaline Phosphatase: 91 U/L (ref 38–126)
Anion gap: 8 (ref 5–15)
BILIRUBIN TOTAL: 0.4 mg/dL (ref 0.3–1.2)
BUN: 17 mg/dL (ref 8–23)
CALCIUM: 8.4 mg/dL — AB (ref 8.9–10.3)
CO2: 22 mmol/L (ref 22–32)
CREATININE: 1.75 mg/dL — AB (ref 0.61–1.24)
Chloride: 111 mmol/L (ref 98–111)
GFR, EST NON AFRICAN AMERICAN: 40 mL/min — AB (ref >60)
GFR, Est AFR Am: 46 mL/min — ABNORMAL LOW (ref >60)
Glucose, Bld: 91 mg/dL (ref 70–99)
Potassium: 4.1 mmol/L (ref 3.5–5.1)
Sodium: 141 mmol/L (ref 135–145)
Total Protein: 6.3 g/dL — ABNORMAL LOW (ref 6.5–8.1)

## 2018-07-13 LAB — LACTATE DEHYDROGENASE: LDH: 119 U/L (ref 98–192)

## 2018-07-13 NOTE — Telephone Encounter (Signed)
Scheduled appt per 2/26 los. ° °Printed calendar and avs. °

## 2018-10-11 NOTE — Progress Notes (Signed)
HEMATOLOGY/ONCOLOGY CLINIC NOTE  Date of Service: 10/12/2018  Patient Care Team: Patient, No Pcp Per as PCP - General (General Practice)  CHIEF COMPLAINTS/PURPOSE OF CONSULTATION:  F/u for Chronic Lymphocytic Leukemia   Oncologic History:  [name] with CLL after a CLL FISH study revealed heterozygous 13q deletion within 92% cells. Subsequent imaging showed diffuse non-bulky lymphadenopathy in chest, abdomen, and pelvis with splenomegaly of 1200 cm^3. The pt began daily Ibrutinib 420mg  on 01/27/17 after becoming increasing fatigued and lost 15 pounds of weight over 4-5 months.   HISTORY OF PRESENTING ILLNESS:   Wesley Bright is a wonderful 68 y.o. male who has been previously seen by my colleague Dr Grace Isaac for evaluation and management of Chronic Lymphocytic Leukemia. He is accompanied today by his daughter and wife. The pt reports that he is doing well overall.   The pt reports that he is feeling better and his energy levels have been improving. He has continued to take Vitamin D, Vitamin B12 and co-enzyme Q. He notes no problems taking his Ibrutinib besides mild rashes that come and go without itching or bothering him very much.   He notes that he has had problems in the past with fluid in the abdomen, and has taken medication for this. He has also intermittently had constipation.   Most recent lab results (11/03/17) of CBC  is as follows: all values are WNL except for WBC at 30.3k, HGB at 12.3, HCT at 37.9, RDW at 15.0, PLT at 109k, Lymphs abs at 26.8k, Creatinine at 1.62, Total Protein at 6.1, Total bilirubin at <0.2, GFR at 43. LDH 6/19;19 is WNL at 164 Uric Acid 11/03/17 is WNL at 4.3  On review of systems, pt reports mild skin rashes, increasing energy levels, and denies mouth sores, fevers, chills, diarrhea, inguinal rashes, itching, vision changes, abdominal pains, leg swelling, and any other symptoms.   Interval History:   Wesley Bright  returns today for management and evaluation of his CLL. The patient's last visit with [place] was on 07/13/18. The pt reports that he is doing well overall.  The pt reports that he has not developed any new concerns in the interim. He has been staying home during the pandemic and denies any current concerns for infections. The pt notes that he has not had any fevers, chills, night sweats or unexpected weight loss. He denies abdominal pains. He notes that he continues tolerating the Ibrutinib well and without complaint.  Lab results today (10/12/18) of CBC w/diff and CMP is as follows: all values are WNL except for WBC at 22.0k, PLT at 112k, Lymphs abs at 17.0k, Creatinine at 1.78, AST at 14, GFR at 39. 10/12/18 LDH at 117  On review of systems, pt reports good energy levels, eating well, staying active, and denies fevers, chills, night sweats, unexpected weight loss, abdominal pains, concerns for infections, noticing any new lumps or bumps, back pain, nose bleeds, gum bleeds, and any other symptoms.   MEDICAL HISTORY:  Past Medical History:  Diagnosis Date  . External hemorrhoid     SURGICAL HISTORY: Past Surgical History:  Procedure Laterality Date  . APPENDECTOMY    . INGUINAL HERNIA REPAIR     left  . KNEE SURGERY     Left Knee    SOCIAL HISTORY: Social History   Socioeconomic History  . Marital status: Married    Spouse name: Not on file  . Number of children: 2  . Years of education:  Not on file  . Highest education level: Not on file  Occupational History  . Occupation: Buyer, retail: [employer]  . Financial resource strain: Not on file  . Food insecurity:    Worry: Not on file    Inability: Not on file  . Transportation needs:    Medical: Not on file    Non-medical: Not on file  Tobacco Use  . Smoking status: Current Every Day Smoker    Packs/day: 0.50  . Smokeless tobacco: Never Used  Substance and Sexual Activity  . Alcohol use:  Yes    Comment: occasional  . Drug use: No  . Sexual activity: Not on file  Lifestyle  . Physical activity:    Days per week: Not on file    Minutes per session: Not on file  . Stress: Not on file  Relationships  . Social connections:    Talks on phone: Not on file    Gets together: Not on file    Attends religious service: Not on file    Active member of club or organization: Not on file    Attends meetings of clubs or organizations: Not on file    Relationship status: Not on file  . Intimate partner violence:    Fear of current or ex partner: Not on file    Emotionally abused: Not on file    Physically abused: Not on file    Forced sexual activity: Not on file  Other Topics Concern  . Not on file  Social History Narrative  . Not on file    FAMILY HISTORY: Family History  Problem Relation Age of Onset  . Liver cancer Brother   . Cancer Brother        Liver  . Esophageal cancer Father   . Cancer Father        Throat   . Cancer Mother        Bladder - cause of death    ALLERGIES:  has No Known Allergies.  MEDICATIONS:  Current Outpatient Medications  Medication Sig Dispense Refill  . cholecalciferol (VITAMIN D) 1000 units tablet Take 1,000 Units by mouth daily.    Marland Kitchen co-enzyme Q-10 30 MG capsule Take 30 mg by mouth 3 (three) times daily.    . IMBRUVICA 420 MG TABS TAKE1 TABLET (420 MG) BY MOUTH DAILY. ADMINISTER ORALLY WITH A GLASS OF WATER AT APPROXIMATELY THE SAME TIME EVERY DAY. 28 tablet 2  . vitamin B-12 (CYANOCOBALAMIN) 100 MCG tablet Take 100 mcg by mouth daily.     No current facility-administered medications for this visit.     REVIEW OF SYSTEMS:    A 10+ POINT REVIEW OF SYSTEMS WAS OBTAINED including neurology, dermatology, psychiatry, cardiac, respiratory, lymph, extremities, GI, GU, Musculoskeletal, constitutional, breasts, reproductive, HEENT.  All pertinent positives are noted in the HPI.  All others are negative.   PHYSICAL EXAMINATION: ECOG  PERFORMANCE STATUS: 1 - Symptomatic but completely ambulatory  Vitals:   10/12/18 0941  BP: (!) 153/100  Pulse: 66  Resp: (!) 8  Temp: 98.3 F (36.8 C)  SpO2: 99%   Filed Weights   10/12/18 0941  Weight: 201 lb 4.8 oz (91.3 kg)   .Body mass index is 28.88 kg/m.  GENERAL:alert, in no acute distress and comfortable SKIN: no acute rashes, no significant lesions EYES: conjunctiva are pink and non-injected, sclera anicteric OROPHARYNX: MMM, no exudates, no oropharyngeal erythema or ulceration NECK: supple, no JVD LYMPH:  no palpable lymphadenopathy in the cervical, axillary or inguinal regions LUNGS: clear to auscultation b/l with normal respiratory effort HEART: regular rate & rhythm ABDOMEN:  normoactive bowel sounds , non tender, not distended. No palpable hepatosplenomegaly.  Extremity: no pedal edema PSYCH: alert & oriented x 3 with fluent speech NEURO: no focal motor/sensory deficits   LABORATORY DATA:  I have reviewed the data as listed  . CBC Latest Ref Rng & Units 10/12/2018 07/13/2018 04/13/2018  WBC 4.0 - 10.5 K/uL 22.0(H) 21.1(H) 32.4(H)  Hemoglobin 13.0 - 17.0 g/dL 15.5 14.0 13.9  Hematocrit 39.0 - 52.0 % 49.2 43.0 43.5  Platelets 150 - 400 K/uL 112(L) 99(L) 119(L)    . CMP Latest Ref Rng & Units 10/12/2018 07/13/2018 04/13/2018  Glucose 70 - 99 mg/dL 79 91 82  BUN 8 - 23 mg/dL 17 17 16   Creatinine 0.61 - 1.24 mg/dL 1.78(H) 1.75(H) 1.64(H)  Sodium 135 - 145 mmol/L 141 141 144  Potassium 3.5 - 5.1 mmol/L 4.4 4.1 4.3  Chloride 98 - 111 mmol/L 110 111 110  CO2 22 - 32 mmol/L 25 22 25   Calcium 8.9 - 10.3 mg/dL 9.1 8.4(L) 8.7(L)  Total Protein 6.5 - 8.1 g/dL 6.8 6.3(L) 6.4(L)  Total Bilirubin 0.3 - 1.2 mg/dL 0.4 0.4 0.5  Alkaline Phos 38 - 126 U/L 97 91 88  AST 15 - 41 U/L 14(L) 12(L) 13(L)  ALT 0 - 44 U/L 14 12 11    12/31/16 Flow Cytometry:    12/15/16 CLL FISH:    RADIOGRAPHIC STUDIES: I have personally reviewed the radiological images as listed and  agreed with the findings in the report. No results found.  ASSESSMENT & PLAN:  68 y.o. male with  1. Chronic Lymphocytic Leukemia - monoalleilic 65H deletion. Was started on Ibrutinib from 01/2017 due to fatigue, weight loss and significant splenomegaly.  2. H/o Grade 1 intermittent rash ?related to Ibrutinib - no currently an issue.  PLAN -Discussed pt labwork today, 10/12/18; WBC stable since 3 months ago at 22.0k with Lymphocytes stable at 17.0k, PLT slightly improved to 112k, HGB normal at 15.5. Chemistries are stable. LDH normal at 117. -The pt has no prohibitive toxicities from continuing Ibrutinib at this time -Splenomegaly has resolved on physical examination and no palpable lymph nodes -Will see the pt back in 10 weeks  3.  Patient Active Problem List   Diagnosis Date Noted  . Skin rash 10/03/2017  . CLL (chronic lymphocytic leukemia) ([place]) 12/09/2016  . General weakness 12/07/2016  . Smoker 12/07/2016  . Loss of weight 12/07/2016  . GERD (gastroesophageal reflux disease) 12/30/2010  . Constipation 12/30/2010  . Hemorrhoids, external 12/30/2010    RTC with Dr Irene Limbo with labs in 10 weeks   All of the patients questions were answered with apparent satisfaction. The patient knows to call the clinic with any problems, questions or concerns.  The total time spent in the appt was 20 minutes and more than 50% was on counseling and direct patient cares.    Sullivan Lone MD MS AAHIVMS [facility] Hematology/Oncology Physician [facility]  (Office):       [phone] (Work cell):  [phone] (Fax):           [phone]  10/12/2018 10:53 AM  I, Baldwin Jamaica, am acting as a scribe for Dr. Sullivan Lone.   .I have reviewed the above documentation for accuracy and completeness, and I agree with the above. Brunetta Genera MD

## 2018-10-12 ENCOUNTER — Inpatient Hospital Stay (HOSPITAL_BASED_OUTPATIENT_CLINIC_OR_DEPARTMENT_OTHER): Payer: PPO | Admitting: Hematology

## 2018-10-12 ENCOUNTER — Other Ambulatory Visit: Payer: Self-pay

## 2018-10-12 ENCOUNTER — Inpatient Hospital Stay: Payer: PPO | Attending: Hematology and Oncology

## 2018-10-12 VITALS — BP 153/100 | HR 66 | Temp 98.3°F | Resp 8 | Ht 70.0 in | Wt 201.3 lb

## 2018-10-12 DIAGNOSIS — F17210 Nicotine dependence, cigarettes, uncomplicated: Secondary | ICD-10-CM | POA: Diagnosis not present

## 2018-10-12 DIAGNOSIS — C9110 Chronic lymphocytic leukemia of B-cell type not having achieved remission: Secondary | ICD-10-CM | POA: Diagnosis not present

## 2018-10-12 DIAGNOSIS — D696 Thrombocytopenia, unspecified: Secondary | ICD-10-CM

## 2018-10-12 LAB — CMP (CANCER CENTER ONLY)
ALT: 14 U/L (ref 0–44)
AST: 14 U/L — ABNORMAL LOW (ref 15–41)
Albumin: 3.7 g/dL (ref 3.5–5.0)
Alkaline Phosphatase: 97 U/L (ref 38–126)
Anion gap: 6 (ref 5–15)
BUN: 17 mg/dL (ref 8–23)
CO2: 25 mmol/L (ref 22–32)
Calcium: 9.1 mg/dL (ref 8.9–10.3)
Chloride: 110 mmol/L (ref 98–111)
Creatinine: 1.78 mg/dL — ABNORMAL HIGH (ref 0.61–1.24)
GFR, Est AFR Am: 45 mL/min — ABNORMAL LOW (ref >60)
GFR, Estimated: 39 mL/min — ABNORMAL LOW (ref >60)
Glucose, Bld: 79 mg/dL (ref 70–99)
Potassium: 4.4 mmol/L (ref 3.5–5.1)
Sodium: 141 mmol/L (ref 135–145)
Total Bilirubin: 0.4 mg/dL (ref 0.3–1.2)
Total Protein: 6.8 g/dL (ref 6.5–8.1)

## 2018-10-12 LAB — CBC WITH DIFFERENTIAL/PLATELET
Abs Immature Granulocytes: 0.04 10*3/uL (ref 0.00–0.07)
Basophils Absolute: 0.1 10*3/uL (ref 0.0–0.1)
Basophils Relative: 0 %
Eosinophils Absolute: 0.1 10*3/uL (ref 0.0–0.5)
Eosinophils Relative: 0 %
HCT: 49.2 % (ref 39.0–52.0)
Hemoglobin: 15.5 g/dL (ref 13.0–17.0)
Immature Granulocytes: 0 %
Lymphocytes Relative: 78 %
Lymphs Abs: 17 10*3/uL — ABNORMAL HIGH (ref 0.7–4.0)
MCH: 28.9 pg (ref 26.0–34.0)
MCHC: 31.5 g/dL (ref 30.0–36.0)
MCV: 91.8 fL (ref 80.0–100.0)
Monocytes Absolute: 0.6 10*3/uL (ref 0.1–1.0)
Monocytes Relative: 3 %
Neutro Abs: 4.2 10*3/uL (ref 1.7–7.7)
Neutrophils Relative %: 19 %
Platelets: 112 10*3/uL — ABNORMAL LOW (ref 150–400)
RBC: 5.36 MIL/uL (ref 4.22–5.81)
RDW: 14.2 % (ref 11.5–15.5)
WBC: 22 10*3/uL — ABNORMAL HIGH (ref 4.0–10.5)
nRBC: 0 % (ref 0.0–0.2)

## 2018-10-12 LAB — LACTATE DEHYDROGENASE: LDH: 117 U/L (ref 98–192)

## 2018-10-13 ENCOUNTER — Telehealth: Payer: Self-pay | Admitting: Hematology

## 2018-10-13 NOTE — Telephone Encounter (Signed)
Scheduled appt per 5/27 los.  A calendar will be mailed out.

## 2018-12-20 NOTE — Progress Notes (Signed)
HEMATOLOGY/ONCOLOGY CLINIC NOTE  Date of Service: 12/21/2018  Patient Care Team: Patient, No Pcp Per as PCP - General (General Practice)  CHIEF COMPLAINTS/PURPOSE OF CONSULTATION:  F/u for Chronic Lymphocytic Leukemia   Oncologic History:  [name] with CLL after a CLL FISH study revealed heterozygous 13q deletion within 92% cells. Subsequent imaging showed diffuse non-bulky lymphadenopathy in chest, abdomen, and pelvis with splenomegaly of 1200 cm^3. The pt began daily Ibrutinib 420mg  on 01/27/17 after becoming increasing fatigued and lost 15 pounds of weight over 4-5 months.   HISTORY OF PRESENTING ILLNESS:   Wesley Bright is a wonderful 68 y.o. male who has been previously seen by my colleague Dr Wesley Bright for evaluation and management of Chronic Lymphocytic Leukemia. He is accompanied today by his daughter and wife. The pt reports that he is doing well overall.   The pt reports that he is feeling better and his energy levels have been improving. He has continued to take Vitamin D, Vitamin B12 and co-enzyme Q. He notes no problems taking his Ibrutinib besides mild rashes that come and go without itching or bothering him very much.   He notes that he has had problems in the past with fluid in the abdomen, and has taken medication for this. He has also intermittently had constipation.   Most recent lab results (11/03/17) of CBC  is as follows: all values are WNL except for WBC at 30.3k, HGB at 12.3, HCT at 37.9, RDW at 15.0, PLT at 109k, Lymphs abs at 26.8k, Creatinine at 1.62, Total Protein at 6.1, Total bilirubin at <0.2, GFR at 43. LDH 6/19;19 is WNL at 164 Uric Acid 11/03/17 is WNL at 4.3  On review of systems, pt reports mild skin rashes, increasing energy levels, and denies mouth sores, fevers, chills, diarrhea, inguinal rashes, itching, vision changes, abdominal pains, leg swelling, and any other symptoms.   Interval History:   Wesley Bright  returns today for management and evaluation of his CLL. The patient's last visit with [place] was on 10/12/2018. The pt reports that he is doing well overall.  The pt reports that he is doing well. Denies infection concerns, new lumps or bumps, problems with his medication, fever, chills, night sweats, and weight changes. He reports that he sometimes feel the urge to urinate, but when he goes to the bathroom, he only passes a little bit of urine. Denies dysuria.   Lab results today (12/21/2018) of CBC w/diff and CMP is as follows: all values are WNL except for WBC at 19.7k, PLT at 107k, lymphs abs at 15.0, CO2 at 21, Creatinine at 1.72, Calcium at 8.7, AST at 13, GFR at 41  On review of systems, pt reports no new concerns and denies infection concerns, new lumps or bumps, problems with his medication, fever, chills, night sweats, and weight changes, dysuria, and any other symptoms.   MEDICAL HISTORY:  Past Medical History:  Diagnosis Date  . External hemorrhoid     SURGICAL HISTORY: Past Surgical History:  Procedure Laterality Date  . APPENDECTOMY    . INGUINAL HERNIA REPAIR     left  . KNEE SURGERY     Left Knee    SOCIAL HISTORY: Social History   Socioeconomic History  . Marital status: Married    Spouse name: Not on file  . Number of children: 2  . Years of education: Not on file  . Highest education level: Not on file  Occupational History  .  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 Financial resource strain: Not on file  . Food insecurity    Worry: Not on file    Inability: Not on file  . Transportation needs    Medical: Not on file    Non-medical: Not on file  Tobacco Use  . Smoking status: Current Every Day Smoker    Packs/day: 0.50  . Smokeless tobacco: Never Used  Substance and Sexual Activity  . Alcohol use: Yes    Comment: occasional  . Drug use: No  . Sexual activity: Not on file  Lifestyle  . Physical activity    Days  per week: Not on file    Minutes per session: Not on file  . Stress: Not on file  Relationships  . Social Herbalist on phone: Not on file    Gets together: Not on file    Attends religious service: Not on file    Active member of club or organization: Not on file    Attends meetings of clubs or organizations: Not on file    Relationship status: Not on file  . Intimate partner violence    Fear of current or ex partner: Not on file    Emotionally abused: Not on file    Physically abused: Not on file    Forced sexual activity: Not on file  Other Topics Concern  . Not on file  Social History Narrative  . Not on file    FAMILY HISTORY: Family History  Problem Relation Age of Onset  . Liver cancer Brother   . Cancer Brother        Liver  . Esophageal cancer Father   . Cancer Father        Throat   . Cancer Mother        Bladder - cause of death    ALLERGIES:  has No Known Allergies.  MEDICATIONS:  Current Outpatient Medications  Medication Sig Dispense Refill  . cholecalciferol (VITAMIN D) 1000 units tablet Take 1,000 Units by mouth daily.    Marland Kitchen co-enzyme Q-10 30 MG capsule Take 30 mg by mouth 3 (three) times daily.    . IMBRUVICA 420 MG TABS TAKE1 TABLET (420 MG) BY MOUTH DAILY. ADMINISTER ORALLY WITH A GLASS OF WATER AT APPROXIMATELY THE SAME TIME EVERY DAY. 28 tablet 2  . vitamin B-12 (CYANOCOBALAMIN) 100 MCG tablet Take 100 mcg by mouth daily.     No current facility-administered medications for this visit.     REVIEW OF SYSTEMS:    A 10+ POINT REVIEW OF SYSTEMS WAS OBTAINED including neurology, dermatology, psychiatry, cardiac, respiratory, lymph, extremities, GI, GU, Musculoskeletal, constitutional, breasts, reproductive, HEENT.  All pertinent positives are noted in the HPI.  All others are negative.   PHYSICAL EXAMINATION: ECOG PERFORMANCE STATUS: 1 - Symptomatic but completely ambulatory  Vitals:   12/21/18 1157  BP: (!) 141/84  Pulse: 63  Resp:  18  Temp: 99.1 F (37.3 C)  SpO2: 100%   Filed Weights   12/21/18 1157  Weight: 199 lb 1.6 oz (90.3 kg)   .Body mass index is 28.57 kg/m.   GENERAL:alert, in no acute distress and comfortable SKIN: no acute rashes, no significant lesions EYES: conjunctiva are pink and non-injected, sclera anicteric OROPHARYNX: MMM, no exudates, no oropharyngeal erythema or ulceration NECK: supple, no JVD LYMPH:  no palpable lymphadenopathy in the cervical, axillary or inguinal regions LUNGS: clear to auscultation b/l with  normal respiratory effort HEART: regular rate & rhythm ABDOMEN:  normoactive bowel sounds , non tender, not distended. No palpable hepatosplenomegaly.  Extremity: no pedal edema PSYCH: alert & oriented x 3 with fluent speech NEURO: no focal motor/sensory deficits   LABORATORY DATA:  I have reviewed the data as listed  . CBC Latest Ref Rng & Units 12/21/2018 10/12/2018 07/13/2018  WBC 4.0 - 10.5 K/uL 19.7(H) 22.0(H) 21.1(H)  Hemoglobin 13.0 - 17.0 g/dL 14.8 15.5 14.0  Hematocrit 39.0 - 52.0 % 46.0 49.2 43.0  Platelets 150 - 400 K/uL 107(L) 112(L) 99(L)    . CMP Latest Ref Rng & Units 12/21/2018 10/12/2018 07/13/2018  Glucose 70 - 99 mg/dL 74 79 91  BUN 8 - 23 mg/dL 19 17 17   Creatinine 0.61 - 1.24 mg/dL 1.72(H) 1.78(H) 1.75(H)  Sodium 135 - 145 mmol/L 143 141 141  Potassium 3.5 - 5.1 mmol/L 3.9 4.4 4.1  Chloride 98 - 111 mmol/L 110 110 111  CO2 22 - 32 mmol/L 21(L) 25 22  Calcium 8.9 - 10.3 mg/dL 8.7(L) 9.1 8.4(L)  Total Protein 6.5 - 8.1 g/dL 6.5 6.8 6.3(L)  Total Bilirubin 0.3 - 1.2 mg/dL 0.5 0.4 0.4  Alkaline Phos 38 - 126 U/L 98 97 91  AST 15 - 41 U/L 13(L) 14(L) 12(L)  ALT 0 - 44 U/L 16 14 12    12/31/16 Flow Cytometry:    12/15/16 CLL FISH:    RADIOGRAPHIC STUDIES: I have personally reviewed the radiological images as listed and agreed with the findings in the report. No results found.  ASSESSMENT & PLAN:  68 y.o. male with  1. Chronic Lymphocytic  Leukemia - monoalleilic 52W deletion. Was started on Ibrutinib from 01/2017 due to fatigue, weight loss and significant splenomegaly.  2. H/o Grade 1 intermittent rash ?related to Ibrutinib - no currently an issue.  PLAN -Discussed pt labwork today, 12/21/2018; PLT stable, Lymphocytes improving, blood chemistries are stable -The pt shows no clinical or lab progression of CLL at this time.  -The pt has no prohibitive toxicities from continuing Ibrutinib at this time -Splenomegaly has resolved on physical examination and no palpable lymph nodes -Discussed the possibility of adding Rituxan in the future if the pt does not want to take his medication or experiences other symptoms.  -Will refer to nephrologist for evaluation of kidney function -Will see the pt back in 3 months  3.  Patient Active Problem List   Diagnosis Date Noted  . Skin rash 10/03/2017  . CLL (chronic lymphocytic leukemia) ([place]) 12/09/2016  . General weakness 12/07/2016  . Smoker 12/07/2016  . Loss of weight 12/07/2016  . GERD (gastroesophageal reflux disease) 12/30/2010  . Constipation 12/30/2010  . Hemorrhoids, external 12/30/2010    Nephrology referral to [place] [facility] RTC with Dr. Irene Limbo with labs in 3 months   All of the patients questions were answered with apparent satisfaction. The patient knows to call the clinic with any problems, questions or concerns.  The total time spent in the appt was 25 minutes and more than 50% was on counseling and direct patient cares.  Sullivan Lone MD MS AAHIVMS [facility] Hematology/Oncology Physician [facility]  (Office):       [phone] (Work cell):  [phone] (Fax):           [phone]  12/21/2018 12:42 PM  I, De Burrs, am acting as a scribe for Dr. Irene Limbo  .I have reviewed the above documentation for accuracy and completeness, and I agree with the  above. .Brunetta Genera MD

## 2018-12-21 ENCOUNTER — Other Ambulatory Visit: Payer: Self-pay

## 2018-12-21 ENCOUNTER — Telehealth: Payer: Self-pay | Admitting: Hematology

## 2018-12-21 ENCOUNTER — Inpatient Hospital Stay: Payer: PPO | Attending: Hematology and Oncology

## 2018-12-21 ENCOUNTER — Inpatient Hospital Stay (HOSPITAL_BASED_OUTPATIENT_CLINIC_OR_DEPARTMENT_OTHER): Payer: PPO | Admitting: Hematology

## 2018-12-21 VITALS — BP 141/84 | HR 63 | Temp 99.1°F | Resp 18 | Ht 70.0 in | Wt 199.1 lb

## 2018-12-21 DIAGNOSIS — N183 Chronic kidney disease, stage 3 unspecified: Secondary | ICD-10-CM

## 2018-12-21 DIAGNOSIS — C9110 Chronic lymphocytic leukemia of B-cell type not having achieved remission: Secondary | ICD-10-CM

## 2018-12-21 DIAGNOSIS — D696 Thrombocytopenia, unspecified: Secondary | ICD-10-CM

## 2018-12-21 LAB — CBC WITH DIFFERENTIAL/PLATELET
Abs Immature Granulocytes: 0.03 10*3/uL (ref 0.00–0.07)
Basophils Absolute: 0.1 10*3/uL (ref 0.0–0.1)
Basophils Relative: 0 %
Eosinophils Absolute: 0.1 10*3/uL (ref 0.0–0.5)
Eosinophils Relative: 0 %
HCT: 46 % (ref 39.0–52.0)
Hemoglobin: 14.8 g/dL (ref 13.0–17.0)
Immature Granulocytes: 0 %
Lymphocytes Relative: 77 %
Lymphs Abs: 15 10*3/uL — ABNORMAL HIGH (ref 0.7–4.0)
MCH: 29.2 pg (ref 26.0–34.0)
MCHC: 32.2 g/dL (ref 30.0–36.0)
MCV: 90.7 fL (ref 80.0–100.0)
Monocytes Absolute: 0.6 10*3/uL (ref 0.1–1.0)
Monocytes Relative: 3 %
Neutro Abs: 4 10*3/uL (ref 1.7–7.7)
Neutrophils Relative %: 20 %
Platelets: 107 10*3/uL — ABNORMAL LOW (ref 150–400)
RBC: 5.07 MIL/uL (ref 4.22–5.81)
RDW: 14 % (ref 11.5–15.5)
WBC: 19.7 10*3/uL — ABNORMAL HIGH (ref 4.0–10.5)
nRBC: 0 % (ref 0.0–0.2)

## 2018-12-21 LAB — CMP (CANCER CENTER ONLY)
ALT: 16 U/L (ref 0–44)
AST: 13 U/L — ABNORMAL LOW (ref 15–41)
Albumin: 3.7 g/dL (ref 3.5–5.0)
Alkaline Phosphatase: 98 U/L (ref 38–126)
Anion gap: 12 (ref 5–15)
BUN: 19 mg/dL (ref 8–23)
CO2: 21 mmol/L — ABNORMAL LOW (ref 22–32)
Calcium: 8.7 mg/dL — ABNORMAL LOW (ref 8.9–10.3)
Chloride: 110 mmol/L (ref 98–111)
Creatinine: 1.72 mg/dL — ABNORMAL HIGH (ref 0.61–1.24)
GFR, Est AFR Am: 47 mL/min — ABNORMAL LOW (ref >60)
GFR, Estimated: 41 mL/min — ABNORMAL LOW (ref >60)
Glucose, Bld: 74 mg/dL (ref 70–99)
Potassium: 3.9 mmol/L (ref 3.5–5.1)
Sodium: 143 mmol/L (ref 135–145)
Total Bilirubin: 0.5 mg/dL (ref 0.3–1.2)
Total Protein: 6.5 g/dL (ref 6.5–8.1)

## 2018-12-21 NOTE — Telephone Encounter (Signed)
Scheduled appt per 8/5 los.  Called patient and left a voice message of appt date and time.

## 2019-01-27 ENCOUNTER — Other Ambulatory Visit: Payer: Self-pay | Admitting: Hematology

## 2019-02-08 ENCOUNTER — Telehealth: Payer: Self-pay

## 2019-02-08 NOTE — Telephone Encounter (Signed)
Oral Oncology Patient Advocate Encounter  [place] patient assistance enrollment with Wynetta Emery and Wynetta Emery will expire 02/24/19.  The patient brought in his application signed and his 2019 tax return.  I faxed the completed application today, [id number].  This encounter will be updated until final determination.  [place] Patient [place] Phone [phone] Fax [phone] 02/08/2019   8:47 AM

## 2019-02-14 NOTE — Telephone Encounter (Signed)
Oral Oncology Patient Advocate Encounter  Received notification from [place] and [place] Patient Assistance program that patient has been successfully enrolled into their program to receive Imbruvica from the manufacturer at $0 out of pocket until 04/14/19. The patient will need to sign the Medicare Part D attestation form in order for the enrollment to be extended for the next year.    I called and spoke with patients wife, Wesley Bright. The patient will be coming in for an appointment on 11/5 and will sign the form that day.  She knows we will have to re-apply.   Patient knows to call the office with questions or concerns.   Oral Oncology Clinic will continue to follow.  [place] Patient [place] Phone [phone] Fax [phone] 02/14/2019    10:59 AM

## 2019-02-21 NOTE — Telephone Encounter (Signed)
Oral Oncology Patient Advocate Encounter  I faxed the signed medicare part D attestation form to [employer] today, 10/6.  [place] Patient Wesley Bright Phone [phone] Fax [phone] 02/21/2019   3:46 PM

## 2019-02-22 ENCOUNTER — Telehealth: Payer: Self-pay

## 2019-02-22 NOTE — Telephone Encounter (Signed)
Oral Oncology Patient Advocate Encounter  Received notification from [facility] [facility] that prior authorization for Imbruvica is required.  PA submitted on CoverMyMeds Key [id number] Status is pending  Oral Oncology Clinic will continue to follow.  [place] Patient [place] Phone [phone] Fax [phone] 02/22/2019    1:05 PM

## 2019-02-22 NOTE — Telephone Encounter (Signed)
Oral Oncology Patient Advocate Encounter  Prior Authorization for Kate Sable has been approved.    PA# [id number] Effective dates: 02/22/19 through 02/22/20  Oral Oncology Clinic will continue to follow.   [place] Patient [place] Phone [phone] Fax [phone] 02/22/2019    1:31 PM

## 2019-02-23 ENCOUNTER — Telehealth: Payer: Self-pay

## 2019-02-23 NOTE — Telephone Encounter (Signed)
Oral Oncology Patient Advocate Encounter   When [employer] expires on 05/18/19 the patient will start getting Imbruvica filled at [facility] using this grant.   Was successful in securing patient an $49 grant from Patient [place] [facility]) to provide copayment coverage for Imbruvica.  This will keep the out of pocket expense at $0.     I have spoken with the patient.    The billing information is as follows and has been shared with [place].   Member ID: [id number] Group ID: [id number] RxBin: [id number] Dates of Eligibility: 11/25/18 through 02/22/20  Fund:  [place] Patient [place] Phone [phone] Fax [phone] 02/23/2019    11:41 AM

## 2019-02-23 NOTE — Telephone Encounter (Signed)
Oral Oncology Patient Advocate Encounter  [place] enrollment has been extended through 05/18/19.  Starting Jan. 2021 the patient will get his Imbruvica filled at [facility] using a PANF grant to cover his copays. This information will be in a separate encounter.  [place] Patient [place] Phone [phone] Fax [phone] 02/23/2019   11:38 AM

## 2019-03-08 ENCOUNTER — Encounter: Payer: Self-pay | Admitting: Hematology

## 2019-03-09 ENCOUNTER — Telehealth: Payer: Self-pay | Admitting: Hematology

## 2019-03-09 NOTE — Telephone Encounter (Signed)
Returned patient's phone call regarding rescheduling 11/05 appointment, per patient's request appointment has moved to 11/09.

## 2019-03-16 ENCOUNTER — Telehealth: Payer: Self-pay | Admitting: Pharmacist

## 2019-03-16 DIAGNOSIS — C9110 Chronic lymphocytic leukemia of B-cell type not having achieved remission: Secondary | ICD-10-CM

## 2019-03-16 MED ORDER — IMBRUVICA 420 MG PO TABS: 420.0000 mg | ORAL_TABLET | Freq: Every day | ORAL | 11 refills | Status: DC

## 2019-03-16 NOTE — Telephone Encounter (Addendum)
Oral Oncology Pharmacist Encounter  Oral oncology patient advocate was successful in securing foundation copayment grant to cover out of pocket expenses for ibrutinib at the pharmacy. Patient is currently receiving his medication through [employer] assistance program and will continue to do so until the end of the 2020 calendar year. Patient will start filling ibrutinib at the [facility] in Jan 2021. New prescription sent to the pharmacy today. Patient with follow-up visit to see Dr. Irene Limbo on 03/27/19 at 2:40 pm. I will plan to see the patient in the office at that time for follow-up assessment/counseling and to provide information about continued medication acquisition.  Johny Drilling, PharmD, BCPS, BCOP  03/16/2019 2:58 PM  Oral Oncology Clinic [phone]

## 2019-03-23 ENCOUNTER — Ambulatory Visit: Payer: PPO | Admitting: Hematology

## 2019-03-23 ENCOUNTER — Other Ambulatory Visit: Payer: PPO

## 2019-03-27 ENCOUNTER — Inpatient Hospital Stay (HOSPITAL_BASED_OUTPATIENT_CLINIC_OR_DEPARTMENT_OTHER): Payer: PPO | Admitting: Hematology

## 2019-03-27 ENCOUNTER — Other Ambulatory Visit: Payer: Self-pay

## 2019-03-27 ENCOUNTER — Inpatient Hospital Stay: Payer: PPO | Attending: Hematology and Oncology

## 2019-03-27 VITALS — BP 146/87 | HR 74 | Temp 98.2°F | Resp 18 | Ht 70.0 in | Wt 196.5 lb

## 2019-03-27 DIAGNOSIS — C9110 Chronic lymphocytic leukemia of B-cell type not having achieved remission: Secondary | ICD-10-CM | POA: Diagnosis not present

## 2019-03-27 DIAGNOSIS — R161 Splenomegaly, not elsewhere classified: Secondary | ICD-10-CM | POA: Diagnosis not present

## 2019-03-27 DIAGNOSIS — N183 Chronic kidney disease, stage 3 unspecified: Secondary | ICD-10-CM

## 2019-03-27 LAB — CMP (CANCER CENTER ONLY)
ALT: 15 U/L (ref 0–44)
AST: 14 U/L — ABNORMAL LOW (ref 15–41)
Albumin: 3.8 g/dL (ref 3.5–5.0)
Alkaline Phosphatase: 96 U/L (ref 38–126)
Anion gap: 10 (ref 5–15)
BUN: 17 mg/dL (ref 8–23)
CO2: 24 mmol/L (ref 22–32)
Calcium: 8.5 mg/dL — ABNORMAL LOW (ref 8.9–10.3)
Chloride: 110 mmol/L (ref 98–111)
Creatinine: 1.84 mg/dL — ABNORMAL HIGH (ref 0.61–1.24)
GFR, Est AFR Am: 43 mL/min — ABNORMAL LOW (ref >60)
GFR, Estimated: 37 mL/min — ABNORMAL LOW (ref >60)
Glucose, Bld: 77 mg/dL (ref 70–99)
Potassium: 4.5 mmol/L (ref 3.5–5.1)
Sodium: 144 mmol/L (ref 135–145)
Total Bilirubin: 0.5 mg/dL (ref 0.3–1.2)
Total Protein: 6.6 g/dL (ref 6.5–8.1)

## 2019-03-27 LAB — CBC WITH DIFFERENTIAL/PLATELET
Abs Immature Granulocytes: 0.02 10*3/uL (ref 0.00–0.07)
Basophils Absolute: 0.1 10*3/uL (ref 0.0–0.1)
Basophils Relative: 0 %
Eosinophils Absolute: 0.1 10*3/uL (ref 0.0–0.5)
Eosinophils Relative: 1 %
HCT: 46.9 % (ref 39.0–52.0)
Hemoglobin: 15 g/dL (ref 13.0–17.0)
Immature Granulocytes: 0 %
Lymphocytes Relative: 69 %
Lymphs Abs: 11.7 10*3/uL — ABNORMAL HIGH (ref 0.7–4.0)
MCH: 29.1 pg (ref 26.0–34.0)
MCHC: 32 g/dL (ref 30.0–36.0)
MCV: 91.1 fL (ref 80.0–100.0)
Monocytes Absolute: 0.8 10*3/uL (ref 0.1–1.0)
Monocytes Relative: 5 %
Neutro Abs: 4.2 10*3/uL (ref 1.7–7.7)
Neutrophils Relative %: 25 %
Platelets: 118 10*3/uL — ABNORMAL LOW (ref 150–400)
RBC: 5.15 MIL/uL (ref 4.22–5.81)
RDW: 13.7 % (ref 11.5–15.5)
WBC: 16.8 10*3/uL — ABNORMAL HIGH (ref 4.0–10.5)
nRBC: 0 % (ref 0.0–0.2)

## 2019-03-27 LAB — LACTATE DEHYDROGENASE: LDH: 237 U/L — ABNORMAL HIGH (ref 98–192)

## 2019-03-27 NOTE — Progress Notes (Signed)
HEMATOLOGY/ONCOLOGY CLINIC NOTE  Date of Service: 03/27/2019  Patient Care Team: Patient, No Pcp Per as PCP - General (General Practice)  CHIEF COMPLAINTS/PURPOSE OF CONSULTATION:  F/u for Chronic Lymphocytic Leukemia   Oncologic History:  [name] with CLL after a CLL FISH study revealed heterozygous 13q deletion within 92% cells. Subsequent imaging showed diffuse non-bulky lymphadenopathy in chest, abdomen, and pelvis with splenomegaly of 1200 cm^3. The pt began daily Ibrutinib 420mg  on 01/27/17 after becoming increasing fatigued and lost 15 pounds of weight over 4-5 months.   HISTORY OF PRESENTING ILLNESS:   Wesley Bright is a wonderful 68 y.o. male who has been previously seen by my colleague Dr Wesley Bright for evaluation and management of Chronic Lymphocytic Leukemia. He is accompanied today by his daughter and wife. The pt reports that he is doing well overall.   The pt reports that he is feeling better and his energy levels have been improving. He has continued to take Vitamin D, Vitamin B12 and co-enzyme Q. He notes no problems taking his Ibrutinib besides mild rashes that come and go without itching or bothering him very much.   He notes that he has had problems in the past with fluid in the abdomen, and has taken medication for this. He has also intermittently had constipation.   Most recent lab results (11/03/17) of CBC  is as follows: all values are WNL except for WBC at 30.3k, HGB at 12.3, HCT at 37.9, RDW at 15.0, PLT at 109k, Lymphs abs at 26.8k, Creatinine at 1.62, Total Protein at 6.1, Total bilirubin at <0.2, GFR at 43. LDH 6/19;19 is WNL at 164 Uric Acid 11/03/17 is WNL at 4.3  On review of systems, pt reports mild skin rashes, increasing energy levels, and denies mouth sores, fevers, chills, diarrhea, inguinal rashes, itching, vision changes, abdominal pains, leg swelling, and any other symptoms.   Interval History:   Wesley Bright  returns today for management and evaluation of his CLL. The patient's last visit with [place] was on 12/21/2018. The pt reports that he is doing well overall.  The pt reports that he has been feeling well but has not been to see a Nephrologist in the interim. He has concerns that a Nephrologist will not do anything for him and is not especially concerned about knowing the cause of his kidney dysfunction. Pt no longer sees a PCP to help manage his care. He has been working around the house and has been staying busy. Pt denies any issues with trying to urinate and any flank discomfort but he notes that he does have to urinate often. It has been at least 5-6 years since he has had his prostate examined.   Lab results today (03/27/19) of CBC w/diff and CMP is as follows: all values are WNL except for WBC at 16.8K, PLTs at 118K, Lymphs Abs at 11.7K, Creatinine at 1.84, Calcium at 8.5, AST at 14, GFR Est Non Af Am at 37, Smear Review shows "ATYPICAL LYMPHOCYTES AND LARGE PLATELETS SEEN", Smudge cells are "PRESENT". 03/27/2019 LDH at 237  On review of systems, pt reports polyuria denies fevers, chills, night sweats, new lumps/bumps, infection symptoms, abdominal pain, difficulty passing urine, flank pain and any other symptoms.   MEDICAL HISTORY:  Past Medical History:  Diagnosis Date  . External hemorrhoid     SURGICAL HISTORY: Past Surgical History:  Procedure Laterality Date  . APPENDECTOMY    . INGUINAL HERNIA REPAIR  left  . KNEE SURGERY     Left Knee    SOCIAL HISTORY: Social History   Socioeconomic History  . Marital status: Married    Spouse name: Not on file  . Number of children: 2  . Years of education: Not on file  . Highest education level: Not on file  Occupational History  . 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 Financial resource strain: Not on file  . Food insecurity    Worry: Not on file    Inability: Not on file  .  Transportation needs    Medical: Not on file    Non-medical: Not on file  Tobacco Use  . Smoking status: Current Every Day Smoker    Packs/day: 0.50  . Smokeless tobacco: Never Used  Substance and Sexual Activity  . Alcohol use: Yes    Comment: occasional  . Drug use: No  . Sexual activity: Not on file  Lifestyle  . Physical activity    Days per week: Not on file    Minutes per session: Not on file  . Stress: Not on file  Relationships  . Social Herbalist on phone: Not on file    Gets together: Not on file    Attends religious service: Not on file    Active member of club or organization: Not on file    Attends meetings of clubs or organizations: Not on file    Relationship status: Not on file  . Intimate partner violence    Fear of current or ex partner: Not on file    Emotionally abused: Not on file    Physically abused: Not on file    Forced sexual activity: Not on file  Other Topics Concern  . Not on file  Social History Narrative  . Not on file    FAMILY HISTORY: Family History  Problem Relation Age of Onset  . Liver cancer Brother   . Cancer Brother        Liver  . Esophageal cancer Father   . Cancer Father        Throat   . Cancer Mother        Bladder - cause of death    ALLERGIES:  has No Known Allergies.  MEDICATIONS:  Current Outpatient Medications  Medication Sig Dispense Refill  . cholecalciferol (VITAMIN D) 1000 units tablet Take 1,000 Units by mouth daily.    Marland Kitchen co-enzyme Q-10 30 MG capsule Take 30 mg by mouth 3 (three) times daily.    . Ibrutinib (IMBRUVICA) 420 MG TABS Take 420 mg by mouth daily. Take with a glass of water at approximately the same time each day. 28 tablet 11  . vitamin B-12 (CYANOCOBALAMIN) 100 MCG tablet Take 100 mcg by mouth daily.     No current facility-administered medications for this visit.     REVIEW OF SYSTEMS:   A 10+ POINT REVIEW OF SYSTEMS WAS OBTAINED including neurology, dermatology, psychiatry,  cardiac, respiratory, lymph, extremities, GI, GU, Musculoskeletal, constitutional, breasts, reproductive, HEENT.  All pertinent positives are noted in the HPI.  All others are negative.   PHYSICAL EXAMINATION: ECOG PERFORMANCE STATUS: 1 - Symptomatic but completely ambulatory  Vitals:   03/27/19 1432  BP: (!) 146/87  Pulse: 74  Resp: 18  Temp: 98.2 F (36.8 C)  SpO2: 100%   Filed Weights   03/27/19 1432  Weight: 196 lb 8 oz (89.1 kg)   .Body  mass index is 28.19 kg/m.    GENERAL:alert, in no acute distress and comfortable SKIN: no acute rashes, no significant lesions EYES: conjunctiva are pink and non-injected, sclera anicteric OROPHARYNX: MMM, no exudates, no oropharyngeal erythema or ulceration NECK: supple, no JVD LYMPH:  no palpable lymphadenopathy in the cervical, axillary or inguinal regions LUNGS: clear to auscultation b/l with normal respiratory effort HEART: regular rate & rhythm ABDOMEN:  normoactive bowel sounds , non tender, not distended. No palpable hepatosplenomegaly.  Extremity: no pedal edema PSYCH: alert & oriented x 3 with fluent speech NEURO: no focal motor/sensory deficits  LABORATORY DATA:  I have reviewed the data as listed  . CBC Latest Ref Rng & Units 03/27/2019 12/21/2018 10/12/2018  WBC 4.0 - 10.5 K/uL 16.8(H) 19.7(H) 22.0(H)  Hemoglobin 13.0 - 17.0 g/dL 15.0 14.8 15.5  Hematocrit 39.0 - 52.0 % 46.9 46.0 49.2  Platelets 150 - 400 K/uL 118(L) 107(L) 112(L)    . CMP Latest Ref Rng & Units 03/27/2019 12/21/2018 10/12/2018  Glucose 70 - 99 mg/dL 77 74 79  BUN 8 - 23 mg/dL 17 19 17   Creatinine 0.61 - 1.24 mg/dL 1.84(H) 1.72(H) 1.78(H)  Sodium 135 - 145 mmol/L 144 143 141  Potassium 3.5 - 5.1 mmol/L 4.5 3.9 4.4  Chloride 98 - 111 mmol/L 110 110 110  CO2 22 - 32 mmol/L 24 21(L) 25  Calcium 8.9 - 10.3 mg/dL 8.5(L) 8.7(L) 9.1  Total Protein 6.5 - 8.1 g/dL 6.6 6.5 6.8  Total Bilirubin 0.3 - 1.2 mg/dL 0.5 0.5 0.4  Alkaline Phos 38 - 126 U/L 96 98 97   AST 15 - 41 U/L 14(L) 13(L) 14(L)  ALT 0 - 44 U/L 15 16 14    12/31/16 Flow Cytometry:    12/15/16 CLL FISH:    RADIOGRAPHIC STUDIES: I have personally reviewed the radiological images as listed and agreed with the findings in the report. No results found.  ASSESSMENT & PLAN:  68 y.o. male with  1. Chronic Lymphocytic Leukemia - monoalleilic 0000000 deletion. Was started on Ibrutinib from 01/2017 due to fatigue, weight loss and significant splenomegaly.  2. H/o Grade 1 intermittent rash ?related to Ibrutinib - no currently an issue.  PLAN: -Discussed pt labwork today, 03/27/19; all values are WNL except for WBC at 16.8K, PLTs at 118K, Lymphs Abs at 11.7K, Creatinine at 1.84, Calcium at 8.5, AST at 14, GFR Est Non Af Am at 37, Smear Review shows "ATYPICAL LYMPHOCYTES AND LARGE PLATELETS SEEN", Smudge cells are "PRESENT". -Discussed 03/27/2019 LDH at 237 -Pt will let us know if he would like an Abdominal US and urine test in light of his refusal to see a Nephrologist at this time -The pt shows no clinical or lab progression of CLL at this time.  -The pt has no prohibitive toxicities from continuing Ibrutinib at this time  -Refill ibrutinib  -Will see back in 3 months with labs   3.  Patient Active Problem List   Diagnosis Date Noted  . Skin rash 10/03/2017  . CLL (chronic lymphocytic leukemia) ([place]) 12/09/2016  . General weakness 12/07/2016  . Smoker 12/07/2016  . Loss of weight 12/07/2016  . GERD (gastroesophageal reflux disease) 12/30/2010  . Constipation 12/30/2010  . Hemorrhoids, external 12/30/2010    FOLLOW UP: RTC with Dr Irene Limbo with labs in 3 months  The total time spent in the appt was 20 minutes and more than 50% was on counseling and direct patient cares.  All of the patient's questions were answered with  apparent satisfaction. The patient knows to call the clinic with any problems, questions or concerns.  Sullivan Lone MD [place] AAHIVMS [facility] Hematology/Oncology  Physician [facility]  (Office):       [phone] (Work cell):  [phone] (Fax):           [phone]  03/27/2019 4:48 PM  I, Yevette Edwards, am acting as a scribe for Dr. Sullivan Lone.   .I have reviewed the above documentation for accuracy and completeness, and I agree with the above. Brunetta Genera MD

## 2019-03-28 ENCOUNTER — Telehealth: Payer: Self-pay | Admitting: Hematology

## 2019-03-28 ENCOUNTER — Encounter: Payer: Self-pay | Admitting: Pharmacist

## 2019-03-28 NOTE — Telephone Encounter (Signed)
Scheduled appt per 11/9 los,  Left a VM of the appt date and time.

## 2019-05-08 ENCOUNTER — Telehealth: Payer: Self-pay

## 2019-05-08 NOTE — Telephone Encounter (Signed)
Oral Oncology Patient Advocate Encounter  Patient's eligibility for Imbruvica with  Wesley Bright and Wesley Bright Patient Wesley Bright has been extended to 05/17/20.  Wesley Bright patient assistance phone number for follow up is [phone].   [name] Phone [phone] Fax [phone] 05/08/2019 11:00 AM

## 2019-05-22 MED FILL — IMBRUVICA 420 MG TAB: 420 | 28 days supply | Qty: 28 | Fill #0

## 2019-06-12 ENCOUNTER — Other Ambulatory Visit (HOSPITAL_COMMUNITY): Payer: Self-pay | Admitting: Nephrology

## 2019-06-12 DIAGNOSIS — N401 Enlarged prostate with lower urinary tract symptoms: Secondary | ICD-10-CM | POA: Diagnosis not present

## 2019-06-12 DIAGNOSIS — R3129 Other microscopic hematuria: Secondary | ICD-10-CM

## 2019-06-12 DIAGNOSIS — Z720 Tobacco use: Secondary | ICD-10-CM | POA: Diagnosis not present

## 2019-06-12 DIAGNOSIS — C9110 Chronic lymphocytic leukemia of B-cell type not having achieved remission: Secondary | ICD-10-CM | POA: Diagnosis not present

## 2019-06-12 DIAGNOSIS — I129 Hypertensive chronic kidney disease with stage 1 through stage 4 chronic kidney disease, or unspecified chronic kidney disease: Secondary | ICD-10-CM | POA: Diagnosis not present

## 2019-06-12 DIAGNOSIS — N1832 Chronic kidney disease, stage 3b: Secondary | ICD-10-CM | POA: Diagnosis not present

## 2019-06-15 ENCOUNTER — Encounter: Payer: BLUE CROSS/BLUE SHIELD | Admitting: Emergency Medicine

## 2019-06-16 ENCOUNTER — Other Ambulatory Visit: Payer: Self-pay | Admitting: Nephrology

## 2019-06-16 DIAGNOSIS — N1832 Chronic kidney disease, stage 3b: Secondary | ICD-10-CM | POA: Diagnosis not present

## 2019-06-16 DIAGNOSIS — N401 Enlarged prostate with lower urinary tract symptoms: Secondary | ICD-10-CM

## 2019-06-16 DIAGNOSIS — R3129 Other microscopic hematuria: Secondary | ICD-10-CM

## 2019-06-18 ENCOUNTER — Other Ambulatory Visit: Payer: Self-pay | Admitting: Radiology

## 2019-06-19 ENCOUNTER — Ambulatory Visit
Admission: RE | Admit: 2019-06-19 | Discharge: 2019-06-19 | Disposition: A | Discharge status: Home / Self Care | Payer: PPO | Source: Ambulatory Visit | Attending: Nephrology | Admitting: Nephrology

## 2019-06-19 ENCOUNTER — Other Ambulatory Visit: Payer: Self-pay | Admitting: Physician Assistant

## 2019-06-19 DIAGNOSIS — R3129 Other microscopic hematuria: Secondary | ICD-10-CM

## 2019-06-19 DIAGNOSIS — N401 Enlarged prostate with lower urinary tract symptoms: Secondary | ICD-10-CM

## 2019-06-19 DIAGNOSIS — N189 Chronic kidney disease, unspecified: Secondary | ICD-10-CM | POA: Diagnosis not present

## 2019-06-19 DIAGNOSIS — N1832 Chronic kidney disease, stage 3b: Secondary | ICD-10-CM

## 2019-06-20 ENCOUNTER — Ambulatory Visit (HOSPITAL_COMMUNITY)
Admission: RE | Admit: 2019-06-20 | Discharge: 2019-06-20 | Disposition: A | Discharge status: Home / Self Care | Payer: PPO | Source: Ambulatory Visit | Attending: Nephrology | Admitting: Nephrology

## 2019-06-20 ENCOUNTER — Other Ambulatory Visit: Payer: Self-pay

## 2019-06-20 DIAGNOSIS — Z79899 Other long term (current) drug therapy: Secondary | ICD-10-CM | POA: Diagnosis not present

## 2019-06-20 DIAGNOSIS — R3129 Other microscopic hematuria: Secondary | ICD-10-CM

## 2019-06-20 DIAGNOSIS — C9110 Chronic lymphocytic leukemia of B-cell type not having achieved remission: Secondary | ICD-10-CM | POA: Diagnosis not present

## 2019-06-20 DIAGNOSIS — F17210 Nicotine dependence, cigarettes, uncomplicated: Secondary | ICD-10-CM | POA: Diagnosis not present

## 2019-06-20 DIAGNOSIS — N1832 Chronic kidney disease, stage 3b: Secondary | ICD-10-CM | POA: Diagnosis not present

## 2019-06-20 LAB — CBC WITH DIFFERENTIAL/PLATELET
Abs Immature Granulocytes: 0 10*3/uL (ref 0.00–0.07)
Band Neutrophils: 1 %
Basophils Absolute: 0 10*3/uL (ref 0.0–0.1)
Basophils Relative: 0 %
Eosinophils Absolute: 0.3 10*3/uL (ref 0.0–0.5)
Eosinophils Relative: 2 %
HCT: 43.8 % (ref 39.0–52.0)
Hemoglobin: 14.3 g/dL (ref 13.0–17.0)
Lymphocytes Relative: 65 %
Lymphs Abs: 10.1 10*3/uL — ABNORMAL HIGH (ref 0.7–4.0)
MCH: 29.4 pg (ref 26.0–34.0)
MCHC: 32.6 g/dL (ref 30.0–36.0)
MCV: 90.1 fL (ref 80.0–100.0)
Monocytes Absolute: 0.3 10*3/uL (ref 0.1–1.0)
Monocytes Relative: 2 %
Neutro Abs: 4.8 10*3/uL (ref 1.7–7.7)
Neutrophils Relative %: 30 %
Platelets: 103 10*3/uL — ABNORMAL LOW (ref 150–400)
RBC: 4.86 MIL/uL (ref 4.22–5.81)
RDW: 14.3 % (ref 11.5–15.5)
WBC: 15.5 10*3/uL — ABNORMAL HIGH (ref 4.0–10.5)
nRBC: 0 % (ref 0.0–0.2)

## 2019-06-20 LAB — ABO/RH: ABO/RH(D): AB POS

## 2019-06-20 LAB — BASIC METABOLIC PANEL
Anion gap: 9 (ref 5–15)
BUN: 17 mg/dL (ref 8–23)
CO2: 21 mmol/L — ABNORMAL LOW (ref 22–32)
Calcium: 8.5 mg/dL — ABNORMAL LOW (ref 8.9–10.3)
Chloride: 110 mmol/L (ref 98–111)
Creatinine, Ser: 1.73 mg/dL — ABNORMAL HIGH (ref 0.61–1.24)
GFR calc Af Amer: 46 mL/min — ABNORMAL LOW (ref >60)
GFR calc non Af Amer: 40 mL/min — ABNORMAL LOW (ref >60)
Glucose, Bld: 96 mg/dL (ref 70–99)
Potassium: 4 mmol/L (ref 3.5–5.1)
Sodium: 140 mmol/L (ref 135–145)

## 2019-06-20 LAB — TYPE AND SCREEN
ABO/RH(D): AB POS
Antibody Screen: NEGATIVE

## 2019-06-20 LAB — PROTIME-INR
INR: 1 (ref 0.8–1.2)
Prothrombin Time: 12.9 seconds (ref 11.4–15.2)

## 2019-06-20 MED ORDER — FENTANYL CITRATE (PF) 100 MCG/2ML IJ SOLN
INTRAMUSCULAR | Status: AC
  Filled 2019-06-20: qty 2

## 2019-06-20 MED ORDER — SODIUM CHLORIDE 0.9 % IV SOLN: INTRAVENOUS | Status: DC

## 2019-06-20 MED ORDER — LIDOCAINE HCL (PF) 1 % IJ SOLN
INTRAMUSCULAR | Status: AC
  Filled 2019-06-20: qty 30

## 2019-06-20 MED ORDER — MIDAZOLAM HCL 2 MG/2ML IJ SOLN
INTRAMUSCULAR | Status: AC | PRN
  Administered 2019-06-20: 1 mg via INTRAVENOUS
  Administered 2019-06-20: 0.5 mg via INTRAVENOUS

## 2019-06-20 MED ORDER — MIDAZOLAM HCL 2 MG/2ML IJ SOLN
INTRAMUSCULAR | Status: AC
  Filled 2019-06-20: qty 2

## 2019-06-20 MED ORDER — FENTANYL CITRATE (PF) 100 MCG/2ML IJ SOLN
INTRAMUSCULAR | Status: AC | PRN
  Administered 2019-06-20: 50 ug via INTRAVENOUS
  Administered 2019-06-20: 25 ug via INTRAVENOUS

## 2019-06-20 MED FILL — IMBRUVICA 420 MG TAB: 420 | 28 days supply | Qty: 28 | Fill #1

## 2019-06-20 NOTE — Discharge Instructions (Signed)
Percutaneous Kidney Biopsy, Care After This sheet gives you information about how to care for yourself after your procedure. Your health care provider may also give you more specific instructions. If you have problems or questions, contact your health care provider. What can I expect after the procedure? After the procedure, it is common to have:  Pain or soreness near the biopsy site.  Pink or cloudy urine for 24 hours after the procedure. Follow these instructions at home: Activity  Return to your normal activities as told by your health care provider. Ask your health care provider what activities are safe for you.  If you were given a sedative during the procedure, it can affect you for several hours. Do not drive or operate machinery until your health care provider says that it is safe.  Do not lift anything that is heavier than 10 lb (4.5 kg), or the limit that you are told, until your health care provider says that it is safe.  Avoid activities that take a lot of effort (are strenuous) until your health care provider approves. Most people will have to wait 2 weeks before returning to activities such as exercise or sex. General instructions   Take over-the-counter and prescription medicines only as told by your health care provider.  You may eat and drink after your procedure. Follow instructions from your health care provider about eating or drinking restrictions.  Check your biopsy site every day for signs of infection. Check for: ? More redness, swelling, or pain. ? Fluid or blood. ? Warmth. ? Pus or a bad smell.  Keep all follow-up visits as told by your health care provider. This is important. Contact a health care provider if:  You have more redness, swelling, or pain around your biopsy site.  You have fluid or blood coming from your biopsy site.  Your biopsy site feels warm to the touch.  You have pus or a bad smell coming from your biopsy site.  You have blood  in your urine more than 24 hours after your procedure.  You have a fever. Get help right away if:  Your urine is dark red or brown.  You cannot urinate.  It burns when you urinate.  You feel dizzy or light-headed.  You have severe pain in your abdomen or side. Summary  After the procedure, it is common to have pain or soreness at the biopsy site and pink or cloudy urine for the first 24 hours.  Check your biopsy site each day for signs of infection, such as more redness, swelling, or pain; fluid, blood, pus or a bad smell coming from the biopsy site; or the biopsy site feeling warm to touch.  Return to your normal activities as told by your health care provider. This information is not intended to replace advice given to you by your health care provider. Make sure you discuss any questions you have with your health care provider. Document Revised: 01/05/2019 Document Reviewed: 01/05/2019 Elsevier Patient Education  2020 [employer]  

## 2019-06-20 NOTE — Procedures (Signed)
Interventional Radiology Procedure:   Indications: Microscopic hematuria  Procedure: US guided random renal biopsy  Findings: 2 cores from left kidney lower pole  Complications: None     EBL: less than 10 ml  Plan: Strict bedrest 4 hours.     Talen Poser R. Anselm Pancoast, MD  Pager: [phone]

## 2019-06-20 NOTE — H&P (Addendum)
Chief Complaint: Patient was seen in consultation today for a random renal biopsy.  Referring Physician(s): Claudia Desanctis  Supervising Physician: Markus Daft   Patient Status: [facility] - Out-pt  History of Present Illness: Wesley Bright is a 69 y.o. male with a past medical history significant for leukemia, glomerulonephritis and CKD IIIb followed by Dr. Royce Macadamia who presents today for a random renal biopsy. Patient presents with his daughter today, he speaks [place] however he does have some difficulty with more complex English phrases so his daughter is helping to translate a few things. Patient's daughter reports he has a history of leukemia and is not currently undergoing any treatment that she knows of, however per chart patient was last seen by Dr. Irene Limbo on 03/27/19 where his Ibrutinib was refilled. They state that he has never had issues with his kidneys before and the first time they saw Dr. Royce Macadamia was on Monday, they are a little confused as to why he needs a biopsy of this quickly but are agreeable to proceed. Patient denies any gross hematuria at any point. They both state understanding of the requested procedure indications and risks and are agreeable to proceed.   Past Medical History:  Diagnosis Date  . External hemorrhoid     Past Surgical History:  Procedure Laterality Date  . APPENDECTOMY    . INGUINAL HERNIA REPAIR     left  . KNEE SURGERY     Left Knee    Allergies: Patient has no known allergies.  Medications: Prior to Admission medications   Medication Sig Start Date End Date Taking? Authorizing Provider  cholecalciferol (VITAMIN D) 1000 units tablet Take 1,000 Units by mouth daily.   Yes [provider]  co-enzyme Q-10 30 MG capsule Take 30 mg by mouth 3 (three) times daily.   Yes [provider]  Ibrutinib (IMBRUVICA) 420 MG TABS Take 420 mg by mouth daily. Take with a glass of water at approximately the same time each day. 03/16/19  Yes Brunetta Genera, MD  tamsulosin (FLOMAX) 0.4 MG CAPS capsule Take 0.4 mg by mouth daily. 06/13/19  Yes [provider]  vitamin B-12 (CYANOCOBALAMIN) 100 MCG tablet Take 100 mcg by mouth daily.   Yes [provider]     Family History  Problem Relation Age of Onset  . Liver cancer Brother   . Cancer Brother        Liver  . Esophageal cancer Father   . Cancer Father        Throat   . Cancer Mother        Bladder - cause of death    Social History   Socioeconomic History  . Marital status: Married    Spouse name: Not on file  . Number of children: 2  . Years of education: Not on file  . Highest education level: Not on file  Occupational History  . Occupation: Buyer, retail: [employer]  Tobacco Use  . Smoking status: Current Every Day Smoker    Packs/day: 0.50  . Smokeless tobacco: Never Used  Substance and Sexual Activity  . Alcohol use: Yes    Comment: occasional  . Drug use: No  . Sexual activity: Not on file  Other Topics Concern  . Not on file  Social History Narrative  . Not on file   Social Determinants of Health   Financial Resource Strain:   . Difficulty of Paying Living Expenses: Not on file  Food  Insecurity:   . Worried About Charity fundraiser in the Last Year: Not on file  . Ran Out of Food in the Last Year: Not on file  Transportation Needs:   . Lack of Transportation (Medical): Not on file  . Lack of Transportation (Non-Medical): Not on file  Physical Activity:   . Days of Exercise per Week: Not on file  . Minutes of Exercise per Session: Not on file  Stress:   . Feeling of Stress : Not on file  Social Connections:   . Frequency of Communication with Friends and Family: Not on file  . Frequency of Social Gatherings with Friends and Family: Not on file  . Attends Religious Services: Not on file  . Active Member of Clubs or Organizations: Not on file  . Attends Archivist Meetings: Not on file  .  Marital Status: Not on file     Review of Systems: A 12 point ROS discussed and pertinent positives are indicated in the HPI above.  All other systems are negative.  Review of Systems  Constitutional: Negative for appetite change, chills and fever.  Respiratory: Negative for cough and shortness of breath.   Cardiovascular: Negative for chest pain.  Gastrointestinal: Negative for abdominal pain, diarrhea, nausea and vomiting.  Genitourinary: Positive for hematuria (microscopic). Negative for dysuria.  Musculoskeletal: Negative for back pain.  Skin: Negative for rash and wound.  Neurological: Negative for dizziness and light-headedness.    Vital Signs: BP (!) 146/84   Pulse 67   Temp 97.7 F (36.5 C) (Oral)   Resp 18   Ht 6' (1.829 m)   Wt 197 lb (89.4 kg)   SpO2 99%   BMI 26.72 kg/m   Physical Exam Vitals reviewed.  Constitutional:      General: He is not in acute distress. HENT:     Head: Normocephalic.     Mouth/Throat:     Mouth: Mucous membranes are moist.     Pharynx: Oropharynx is clear. No oropharyngeal exudate or posterior oropharyngeal erythema.  Cardiovascular:     Rate and Rhythm: Normal rate and regular rhythm.  Pulmonary:     Effort: Pulmonary effort is normal.     Breath sounds: Normal breath sounds.  Abdominal:     General: There is no distension.     Palpations: Abdomen is soft.     Tenderness: There is no abdominal tenderness.  Skin:    General: Skin is warm and dry.  Neurological:     Mental Status: He is alert and oriented to person, place, and time.  Psychiatric:        Mood and Affect: Mood normal.        Behavior: Behavior normal.        Thought Content: Thought content normal.        Judgment: Judgment normal.      MD Evaluation Airway: WNL Heart: WNL Abdomen: WNL Chest/ Lungs: WNL ASA  Classification: 2 Mallampati/Airway Score: Two   Imaging: US RENAL  Result Date: 06/19/2019 CLINICAL DATA:  69 year old male with chronic  kidney disease and hematuria. EXAM: RENAL / URINARY TRACT ULTRASOUND COMPLETE COMPARISON:  CT abdomen pelvis dated 12/22/2016. FINDINGS: Right Kidney: Renal measurements: 11.7 x 4.8 x 4.9 cm = volume: 132 mL. There is mild increased renal parenchymal echogenicity. No hydronephrosis or shadowing stone. There is a 7 x 4 x 6 mm interpolar cyst. Left Kidney: Renal measurements: 11.6 x 4.6 x 4.7 cm = volume: 132 mL. Mild  increased echogenicity. No hydronephrosis or shadowing stone. Bladder: Appears normal for degree of bladder distention. Other: The spleen is mildly enlarged measuring up to 14 cm in greatest dimension and a volume of 562 cc. The prostate gland measures 4.1 x 3.7 x 4.4 cm. IMPRESSION: 1. Mildly echogenic kidneys in keeping with chronic kidney disease. No hydronephrosis or shadowing stone. 2. Mild splenomegaly. 3. Mildly enlarged prostate gland. Electronically Signed   By: Anner Crete M.D.   On: 06/19/2019 16:53    Labs:  CBC: Recent Labs    07/13/18 1438 10/12/18 0909 12/21/18 1101 03/27/19 1405  WBC 21.1* 22.0* 19.7* 16.8*  HGB 14.0 15.5 14.8 15.0  HCT 43.0 49.2 46.0 46.9  PLT 99* 112* 107* 118*    COAGS: No results for input(s): INR, APTT in the last 8760 hours.  BMP: Recent Labs    07/13/18 1438 10/12/18 0909 12/21/18 1101 03/27/19 1405  NA 141 141 143 144  K 4.1 4.4 3.9 4.5  CL 111 110 110 110  CO2 22 25 21* 24  GLUCOSE 91 79 74 77  BUN 17 17 19 17   CALCIUM 8.4* 9.1 8.7* 8.5*  CREATININE 1.75* 1.78* 1.72* 1.84*  GFRNONAA 40* 39* 41* 37*  GFRAA 46* 45* 47* 43*    LIVER FUNCTION TESTS: Recent Labs    07/13/18 1438 10/12/18 0909 12/21/18 1101 03/27/19 1405  BILITOT 0.4 0.4 0.5 0.5  AST 12* 14* 13* 14*  ALT 12 14 16 15   ALKPHOS 91 97 98 96  PROT 6.3* 6.8 6.5 6.6  ALBUMIN 3.6 3.7 3.7 3.8    TUMOR MARKERS: No results for input(s): AFPTM, CEA, CA199, CHROMGRNA in the last 8760 hours.  Assessment and Plan:  69 y/o M with CKD IIIb followed by Dr.  Royce Macadamia with recently noted new onset hematuria - IR has been asked to perform a random renal biopsy for further evaluation.  Patient has been NPO since  7 pm last night, he does not take blood thinning medications, he did not take any of his regular medications this morning. Afebrile, WBC 15.5, hgb 14.3, plt pending at time of this note writing, INR 1.0, creatinine 1.73.  Risks and benefits of random renal biopsy was discussed with the patient and/or patient's family including, but not limited to bleeding, infection, damage to adjacent structures or low yield requiring additional tests.  All of the questions were answered and there is agreement to proceed.  Consent signed and in chart.  Thank you for this interesting consult.  I greatly enjoyed meeting Jobanny Corbello and look forward to participating in their care.  A copy of this report was sent to the requesting provider on this date.  Electronically Signed: Joaquim Nam, PA-C 06/20/2019, 7:24 AM   I spent a total of  30 Minutes in face to face in clinical consultation, greater than 50% of which was counseling/coordinating care for a random renal biopsy.

## 2019-06-21 LAB — PATHOLOGIST SMEAR REVIEW

## 2019-06-27 ENCOUNTER — Other Ambulatory Visit: Payer: Self-pay

## 2019-06-27 ENCOUNTER — Inpatient Hospital Stay: Payer: PPO | Attending: Hematology and Oncology

## 2019-06-27 ENCOUNTER — Inpatient Hospital Stay (HOSPITAL_BASED_OUTPATIENT_CLINIC_OR_DEPARTMENT_OTHER): Payer: PPO | Admitting: Hematology

## 2019-06-27 VITALS — BP 153/84 | HR 71 | Temp 98.0°F | Resp 18 | Ht 72.0 in | Wt 198.8 lb

## 2019-06-27 DIAGNOSIS — D696 Thrombocytopenia, unspecified: Secondary | ICD-10-CM

## 2019-06-27 DIAGNOSIS — C9110 Chronic lymphocytic leukemia of B-cell type not having achieved remission: Secondary | ICD-10-CM

## 2019-06-27 LAB — CBC WITH DIFFERENTIAL/PLATELET
Abs Immature Granulocytes: 0.02 10*3/uL (ref 0.00–0.07)
Basophils Absolute: 0.1 10*3/uL (ref 0.0–0.1)
Basophils Relative: 1 %
Eosinophils Absolute: 0.1 10*3/uL (ref 0.0–0.5)
Eosinophils Relative: 0 %
HCT: 46.4 % (ref 39.0–52.0)
Hemoglobin: 15 g/dL (ref 13.0–17.0)
Immature Granulocytes: 0 %
Lymphocytes Relative: 68 %
Lymphs Abs: 10.4 10*3/uL — ABNORMAL HIGH (ref 0.7–4.0)
MCH: 29.4 pg (ref 26.0–34.0)
MCHC: 32.3 g/dL (ref 30.0–36.0)
MCV: 91 fL (ref 80.0–100.0)
Monocytes Absolute: 0.5 10*3/uL (ref 0.1–1.0)
Monocytes Relative: 3 %
Neutro Abs: 4.4 10*3/uL (ref 1.7–7.7)
Neutrophils Relative %: 28 %
Platelets: 115 10*3/uL — ABNORMAL LOW (ref 150–400)
RBC: 5.1 MIL/uL (ref 4.22–5.81)
RDW: 14.1 % (ref 11.5–15.5)
WBC: 15.4 10*3/uL — ABNORMAL HIGH (ref 4.0–10.5)
nRBC: 0 % (ref 0.0–0.2)

## 2019-06-27 LAB — LACTATE DEHYDROGENASE: LDH: 115 U/L (ref 98–192)

## 2019-06-27 LAB — CMP (CANCER CENTER ONLY)
ALT: 13 U/L (ref 0–44)
AST: 10 U/L — ABNORMAL LOW (ref 15–41)
Albumin: 3.6 g/dL (ref 3.5–5.0)
Alkaline Phosphatase: 107 U/L (ref 38–126)
Anion gap: 7 (ref 5–15)
BUN: 17 mg/dL (ref 8–23)
CO2: 24 mmol/L (ref 22–32)
Calcium: 8.7 mg/dL — ABNORMAL LOW (ref 8.9–10.3)
Chloride: 109 mmol/L (ref 98–111)
Creatinine: 1.73 mg/dL — ABNORMAL HIGH (ref 0.61–1.24)
GFR, Est AFR Am: 46 mL/min — ABNORMAL LOW (ref >60)
GFR, Estimated: 40 mL/min — ABNORMAL LOW (ref >60)
Glucose, Bld: 90 mg/dL (ref 70–99)
Potassium: 4.6 mmol/L (ref 3.5–5.1)
Sodium: 140 mmol/L (ref 135–145)
Total Bilirubin: 0.4 mg/dL (ref 0.3–1.2)
Total Protein: 6.6 g/dL (ref 6.5–8.1)

## 2019-06-27 NOTE — Progress Notes (Signed)
HEMATOLOGY/ONCOLOGY CLINIC NOTE  Date of Service: 06/27/2019  Patient Care Team: Patient, No Pcp Per as PCP - General (General Practice)  CHIEF COMPLAINTS/PURPOSE OF CONSULTATION:  F/u for Chronic Lymphocytic Leukemia   Oncologic History:  Mr Jasmine Nakayama was initially diagnosed with CLL after a CLL FISH study revealed heterozygous 13q deletion within 92% cells. Subsequent imaging showed diffuse non-bulky lymphadenopathy in chest, abdomen, and pelvis with splenomegaly of 1200 cm^3. The pt began daily Ibrutinib 420mg  on 01/27/17 after becoming increasing fatigued and lost 15 pounds of weight over 4-5 months.   HISTORY OF PRESENTING ILLNESS:   Wesley Bright is a wonderful 69 y.o. male who has been previously seen by my colleague Dr Grace Isaac for evaluation and management of Chronic Lymphocytic Leukemia. He is accompanied today by his daughter and wife. The pt reports that he is doing well overall.   The pt reports that he is feeling better and his energy levels have been improving. He has continued to take Vitamin D, Vitamin B12 and co-enzyme Q. He notes no problems taking his Ibrutinib besides mild rashes that come and go without itching or bothering him very much.   He notes that he has had problems in the past with fluid in the abdomen, and has taken medication for this. He has also intermittently had constipation.   Most recent lab results (11/03/17) of CBC  is as follows: all values are WNL except for WBC at 30.3k, HGB at 12.3, HCT at 37.9, RDW at 15.0, PLT at 109k, Lymphs abs at 26.8k, Creatinine at 1.62, Total Protein at 6.1, Total bilirubin at <0.2, GFR at 43. LDH 6/19;19 is WNL at 164 Uric Acid 11/03/17 is WNL at 4.3  On review of systems, pt reports mild skin rashes, increasing energy levels, and denies mouth sores, fevers, chills, diarrhea, inguinal rashes, itching, vision changes, abdominal pains, leg swelling, and any other symptoms.   Interval History:  Wesley Bright returns  today for management and evaluation of his CLL. We are joined today by his wife, Mrs. Fletcher and his daughter, via phone. The patient's last visit with [place] was on 03/27/2019. The pt reports that he is doing well overall.  The pt reports that he has seen Dr. Royce Macadamia at [facility]. He was given a US Renal and a kidney biopsy. The results of the biopsy are not back yet. Pt got a BP cuff for home use and has been sending the results to Dr. Royce Macadamia. She placed pt on low-dose Amlodipine during the appointment. She was concerned that HTN could be causing some damage to his kidneys.   He denies any issues with Ibrutinib. He is hesitant to receive the COVID19 vaccine at this time. Pt continues to work in a [employer] position.   Of note since the patient's last visit, pt has had US Renal ([id number]) completed on 06/19/2019 with results revealing "1. Mildly echogenic kidneys in keeping with chronic kidney disease. No hydronephrosis or shadowing stone. 2. Mild splenomegaly. 3. Mildly enlarged prostate gland."  Lab results today (06/27/19) of CBC w/diff and CMP is as follows: all values are WNL except for WBC at 15.4K, PLT at 115K, Lymphs Abs at 10.4K, Smear Review shows "Variant Lymphs Consistent with CLL", Smudge Cells are "Present", Creatinine at 1.73, Calcium at 8.7, AST at 10, GFR Est Non Af Am at 40. 06/27/2019 LDH at 115  On review of systems, pt denies abdominal pain, fevers, chills, leg swelling and any other symptoms.   MEDICAL HISTORY:  Past Medical History:  Diagnosis Date  . External hemorrhoid     SURGICAL HISTORY: Past Surgical History:  Procedure Laterality Date  . APPENDECTOMY    . INGUINAL HERNIA REPAIR     left  . KNEE SURGERY     Left Knee    SOCIAL HISTORY: Social History   Socioeconomic History  . Marital status: Married    Spouse name: Not on file  . Number of children: 2  . Years of education: Not on file  . Highest education level: Not on file  Occupational  History  . Occupation: Buyer, retail: [employer]  Tobacco Use  . Smoking status: Current Every Day Smoker    Packs/day: 0.50  . Smokeless tobacco: Never Used  Substance and Sexual Activity  . Alcohol use: Yes    Comment: occasional  . Drug use: No  . Sexual activity: Not on file  Other Topics Concern  . Not on file  Social History Narrative  . Not on file   Social Determinants of Health   Financial Resource Strain:   . Difficulty of Paying Living Expenses: Not on file  Food Insecurity:   . Worried About Charity fundraiser in the Last Year: Not on file  . Ran Out of Food in the Last Year: Not on file  Transportation Needs:   . Lack of Transportation (Medical): Not on file  . Lack of Transportation (Non-Medical): Not on file  Physical Activity:   . Days of Exercise per Week: Not on file  . Minutes of Exercise per Session: Not on file  Stress:   . Feeling of Stress : Not on file  Social Connections:   . Frequency of Communication with Friends and Family: Not on file  . Frequency of Social Gatherings with Friends and Family: Not on file  . Attends Religious Services: Not on file  . Active Member of Clubs or Organizations: Not on file  . Attends Archivist Meetings: Not on file  . Marital Status: Not on file  Intimate Partner Violence:   . Fear of Current or Ex-Partner: Not on file  . Emotionally Abused: Not on file  . Physically Abused: Not on file  . Sexually Abused: Not on file    FAMILY HISTORY: Family History  Problem Relation Age of Onset  . Liver cancer Brother   . Cancer Brother        Liver  . Esophageal cancer Father   . Cancer Father        Throat   . Cancer Mother        Bladder - cause of death    ALLERGIES:  has No Known Allergies.  MEDICATIONS:  Current Outpatient Medications  Medication Sig Dispense Refill  . amLODipine (NORVASC) 5 MG tablet Take 5 mg by mouth daily.    . cholecalciferol (VITAMIN D) 1000 units  tablet Take 1,000 Units by mouth daily.    Marland Kitchen co-enzyme Q-10 30 MG capsule Take 30 mg by mouth 3 (three) times daily.    . Ibrutinib (IMBRUVICA) 420 MG TABS Take 420 mg by mouth daily. Take with a glass of water at approximately the same time each day. 28 tablet 11  . tamsulosin (FLOMAX) 0.4 MG CAPS capsule Take 0.4 mg by mouth daily.    . vitamin B-12 (CYANOCOBALAMIN) 100 MCG tablet Take 100 mcg by mouth daily.     No current facility-administered medications for this visit.    REVIEW OF  SYSTEMS:   A 10+ POINT REVIEW OF SYSTEMS WAS OBTAINED including neurology, dermatology, psychiatry, cardiac, respiratory, lymph, extremities, GI, GU, Musculoskeletal, constitutional, breasts, reproductive, HEENT.  All pertinent positives are noted in the HPI.  All others are negative.   PHYSICAL EXAMINATION: ECOG PERFORMANCE STATUS: 1 - Symptomatic but completely ambulatory  Vitals:   06/27/19 1217  BP: (!) 153/84  Pulse: 71  Resp: 18  Temp: 98 F (36.7 C)  SpO2: 99%   Filed Weights   06/27/19 1217  Weight: 198 lb 12.8 oz (90.2 kg)   .Body mass index is 26.96 kg/m.   GENERAL:alert, in no acute distress and comfortable SKIN: no acute rashes, no significant lesions EYES: conjunctiva are pink and non-injected, sclera anicteric OROPHARYNX: MMM, no exudates, no oropharyngeal erythema or ulceration NECK: supple, no JVD LYMPH:  no palpable lymphadenopathy in the cervical, axillary or inguinal regions LUNGS: clear to auscultation b/l with normal respiratory effort HEART: regular rate & rhythm ABDOMEN:  normoactive bowel sounds , non tender, not distended. No palpable hepatosplenomegaly.  Extremity: no pedal edema PSYCH: alert & oriented x 3 with fluent speech NEURO: no focal motor/sensory deficits  LABORATORY DATA:  I have reviewed the data as listed  . CBC Latest Ref Rng & Units 06/27/2019 06/20/2019 03/27/2019  WBC 4.0 - 10.5 K/uL 15.4(H) 15.5(H) 16.8(H)  Hemoglobin 13.0 - 17.0 g/dL 15.0 14.3  15.0  Hematocrit 39.0 - 52.0 % 46.4 43.8 46.9  Platelets 150 - 400 K/uL 115(L) 103(L) 118(L)    . CMP Latest Ref Rng & Units 06/27/2019 06/20/2019 03/27/2019  Glucose 70 - 99 mg/dL 90 96 77  BUN 8 - 23 mg/dL 17 17 17   Creatinine 0.61 - 1.24 mg/dL 1.73(H) 1.73(H) 1.84(H)  Sodium 135 - 145 mmol/L 140 140 144  Potassium 3.5 - 5.1 mmol/L 4.6 4.0 4.5  Chloride 98 - 111 mmol/L 109 110 110  CO2 22 - 32 mmol/L 24 21(L) 24  Calcium 8.9 - 10.3 mg/dL 8.7(L) 8.5(L) 8.5(L)  Total Protein 6.5 - 8.1 g/dL 6.6 - 6.6  Total Bilirubin 0.3 - 1.2 mg/dL 0.4 - 0.5  Alkaline Phos 38 - 126 U/L 107 - 96  AST 15 - 41 U/L 10(L) - 14(L)  ALT 0 - 44 U/L 13 - 15   12/31/16 Flow Cytometry:    12/15/16 CLL FISH:    RADIOGRAPHIC STUDIES: I have personally reviewed the radiological images as listed and agreed with the findings in the report. US RENAL  Result Date: 06/19/2019 CLINICAL DATA:  69 year old male with chronic kidney disease and hematuria. EXAM: RENAL / URINARY TRACT ULTRASOUND COMPLETE COMPARISON:  CT abdomen pelvis dated 12/22/2016. FINDINGS: Right Kidney: Renal measurements: 11.7 x 4.8 x 4.9 cm = volume: 132 mL. There is mild increased renal parenchymal echogenicity. No hydronephrosis or shadowing stone. There is a 7 x 4 x 6 mm interpolar cyst. Left Kidney: Renal measurements: 11.6 x 4.6 x 4.7 cm = volume: 132 mL. Mild increased echogenicity. No hydronephrosis or shadowing stone. Bladder: Appears normal for degree of bladder distention. Other: The spleen is mildly enlarged measuring up to 14 cm in greatest dimension and a volume of 562 cc. The prostate gland measures 4.1 x 3.7 x 4.4 cm. IMPRESSION: 1. Mildly echogenic kidneys in keeping with chronic kidney disease. No hydronephrosis or shadowing stone. 2. Mild splenomegaly. 3. Mildly enlarged prostate gland. Electronically Signed   By: Anner Crete M.D.   On: 06/19/2019 16:53   US BIOPSY (KIDNEY)  Result Date: 06/20/2019 INDICATION: 69 year old  with  history of leukemia and microscopic hematuria. Request for random renal biopsy. EXAM: ULTRASOUND-GUIDED LEFT RENAL BIOPSY MEDICATIONS: None. ANESTHESIA/SEDATION: Moderate (conscious) sedation was employed during this procedure. A total of Versed 1.5 mg and Fentanyl 75 mcg was administered intravenously. Moderate Sedation Time: 14 minutes. The patient's level of consciousness and vital signs were monitored continuously by radiology nursing throughout the procedure under my direct supervision. FLUOROSCOPY TIME:  None COMPLICATIONS: None immediate. PROCEDURE: Informed written consent was obtained from the patient after a thorough discussion of the procedural risks, benefits and alternatives. All questions were addressed. A timeout was performed prior to the initiation of the procedure. Patient was placed prone. Both kidneys were evaluated with ultrasound. The left kidney was selected and targeted for biopsy. Left flank was prepped with chlorhexidine and sterile field was created. Maximal barrier sterile technique was utilized including caps, mask, sterile gowns, sterile gloves, sterile drape, hand hygiene and skin antiseptic. Skin was anesthetized with 1% lidocaine. Using ultrasound guidance, 16 gauge core needle was directed into the left kidney lower pole. Core biopsy obtained from the left renal cortex. Adequate specimen was obtained and placed in saline. A second ultrasound-guided core biopsy obtained from the left kidney lower pole cortex. Specimen placed in saline. Bandage placed over the puncture site. FINDINGS: Negative for hydronephrosis. Core biopsies obtained from the left kidney lower pole. Small hematoma along the left kidney lower pole but no active blood flow or bleeding at the end of the procedure. IMPRESSION: Ultrasound-guided core biopsies from the left kidney lower pole. Electronically Signed   By: Markus Daft M.D.   On: 06/20/2019 09:26    ASSESSMENT & PLAN:  69 y.o. male with  1. Chronic  Lymphocytic Leukemia - monoalleilic 0000000 deletion. Was started on Ibrutinib from 01/2017 due to fatigue, weight loss and significant splenomegaly.  2. H/o Grade 1 intermittent rash ?related to Ibrutinib - no currently an issue.  PLAN: -Discussed pt labwork today, 06/27/19; blood counts are stable, kidney numbers are stable -Discussed 06/27/2019 LDH is WNL at 115 -Discussed 06/19/2019 US Renal ([id number]) "1. Mildly echogenic kidneys in keeping with chronic kidney disease. No hydronephrosis or shadowing stone. 2. Mild splenomegaly. 3. Mildly enlarged prostate gland." -The pt shows no clinical or lab progression of CLL at this time -The pt has no prohibitive toxicities from continuing Ibrutinib at this time -Advised pt that BP readings should be similar in both arms, if they are consistently different report to a physician -Recommend pt get BP readings first thing in the morning, before coffee or breakfast  -Recommend pt receive COVID19 vaccine when available  -Recommend pt f/u with Dr. Royce Macadamia for further CKD evaluation -Will see back in 3 months with labs   3.  Patient Active Problem List   Diagnosis Date Noted  . Skin rash 10/03/2017  . CLL (chronic lymphocytic leukemia) ([place]) 12/09/2016  . General weakness 12/07/2016  . Smoker 12/07/2016  . Loss of weight 12/07/2016  . GERD (gastroesophageal reflux disease) 12/30/2010  . Constipation 12/30/2010  . Hemorrhoids, external 12/30/2010    FOLLOW UP: RTC with Dr Irene Limbo with labs in 3 months   The total time spent in the appt was 20 minutes and more than 50% was on counseling and direct patient cares.  All of the patient's questions were answered with apparent satisfaction. The patient knows to call the clinic with any problems, questions or concerns.  Sullivan Lone MD [place] AAHIVMS [facility] Hematology/Oncology Physician [place]  (Office):  [phone] (Work cell):  [phone] (Fax):            [phone]  06/27/2019 1:05 PM  I, Yevette Edwards, am acting as a scribe for Dr. Sullivan Lone.   .I have reviewed the above documentation for accuracy and completeness, and I agree with the above. Brunetta Genera MD

## 2019-06-29 ENCOUNTER — Telehealth: Payer: Self-pay | Admitting: Hematology

## 2019-06-29 NOTE — Telephone Encounter (Signed)
Scheduled per 02/09 los, patient has been called and notified.  °

## 2019-06-30 LAB — SURGICAL PATHOLOGY

## 2019-07-10 DIAGNOSIS — N1832 Chronic kidney disease, stage 3b: Secondary | ICD-10-CM | POA: Diagnosis not present

## 2019-07-17 DIAGNOSIS — N401 Enlarged prostate with lower urinary tract symptoms: Secondary | ICD-10-CM | POA: Diagnosis not present

## 2019-07-17 DIAGNOSIS — I129 Hypertensive chronic kidney disease with stage 1 through stage 4 chronic kidney disease, or unspecified chronic kidney disease: Secondary | ICD-10-CM | POA: Diagnosis not present

## 2019-07-17 DIAGNOSIS — N1832 Chronic kidney disease, stage 3b: Secondary | ICD-10-CM | POA: Diagnosis not present

## 2019-07-17 DIAGNOSIS — Z720 Tobacco use: Secondary | ICD-10-CM | POA: Diagnosis not present

## 2019-07-17 DIAGNOSIS — C9110 Chronic lymphocytic leukemia of B-cell type not having achieved remission: Secondary | ICD-10-CM | POA: Diagnosis not present

## 2019-07-17 DIAGNOSIS — R3129 Other microscopic hematuria: Secondary | ICD-10-CM | POA: Diagnosis not present

## 2019-07-17 MED FILL — IMBRUVICA 420 MG TAB: 420 | 28 days supply | Qty: 28 | Fill #2

## 2019-07-27 IMAGING — CT CT BIOPSY AND ASPIRATION BONE MARROW
1 of 2 series · 13 of 16 positions shown, 17 images · non-contrast
Comparison: none

CLINICAL DATA: Severe lymphocytic leukocytosis. Need for bone
marrow biopsy.

[Series 2: i-spiral 5.0 b40f · axial · 0.83mm/px · z∈[-201,-82]mm · 13 of 40 slices shown, 17 images]
[im 3/40  soft-tissue]
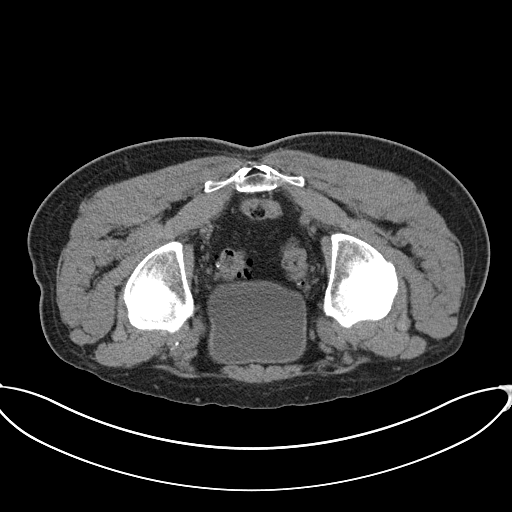
[im 3/40  bone]
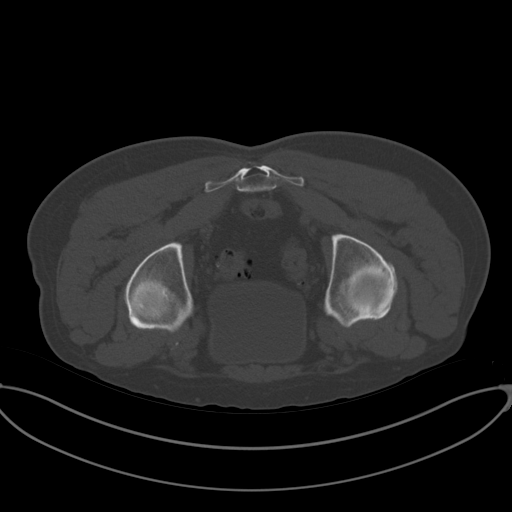
[im 6/40  soft-tissue]
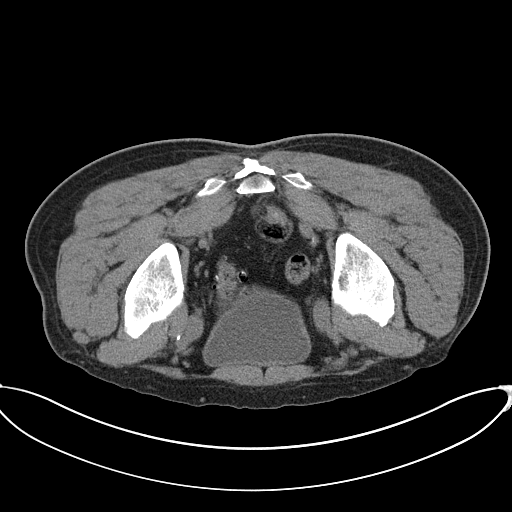
[im 9/40  soft-tissue]
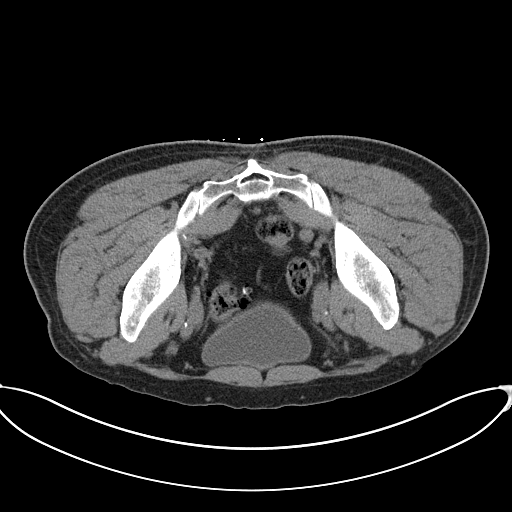
[im 12/40  soft-tissue]
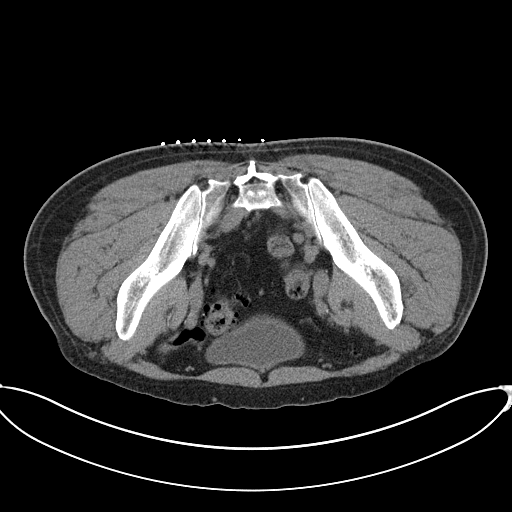
[im 14/40  soft-tissue]
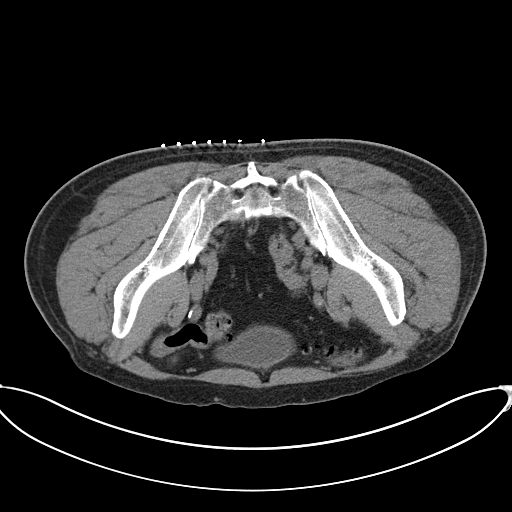
[im 14/40  bone]
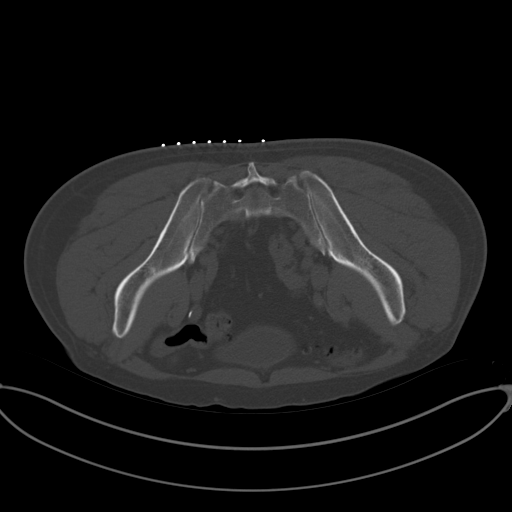
[im 17/40  soft-tissue]
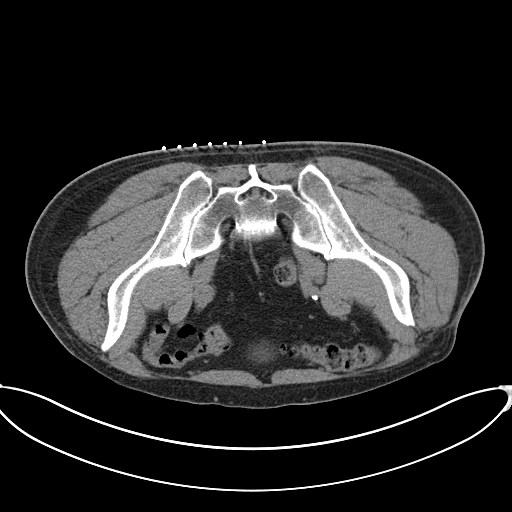
[im 20/40  soft-tissue]
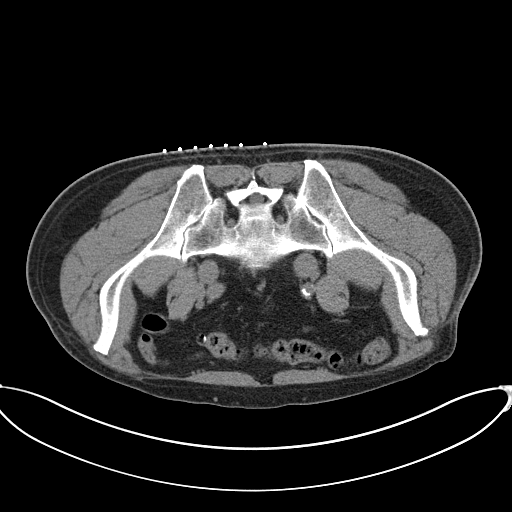
[im 23/40  soft-tissue]
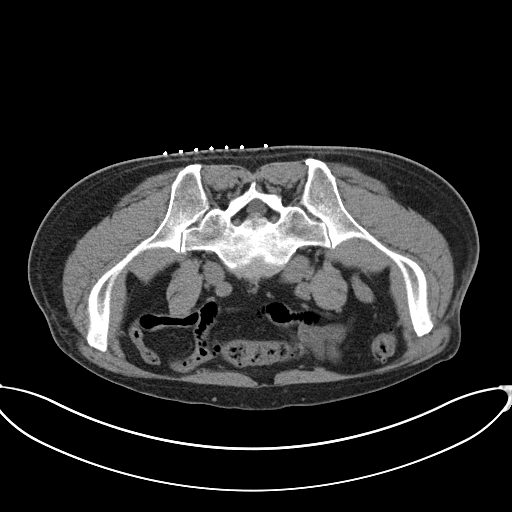
[im 26/40  soft-tissue]
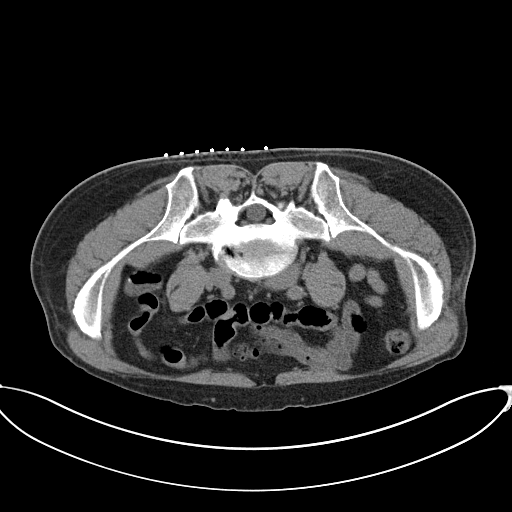
[im 26/40  bone]
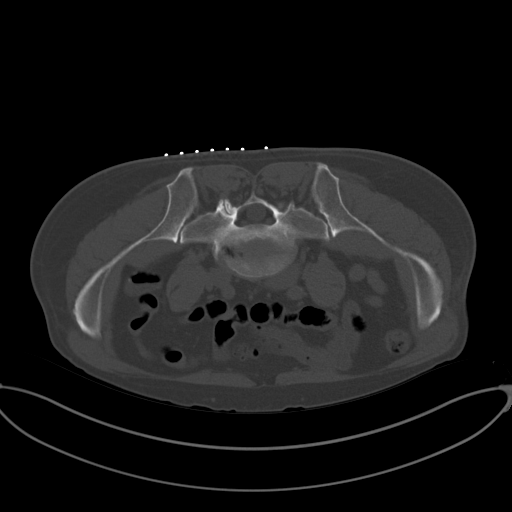
[im 28/40  soft-tissue]
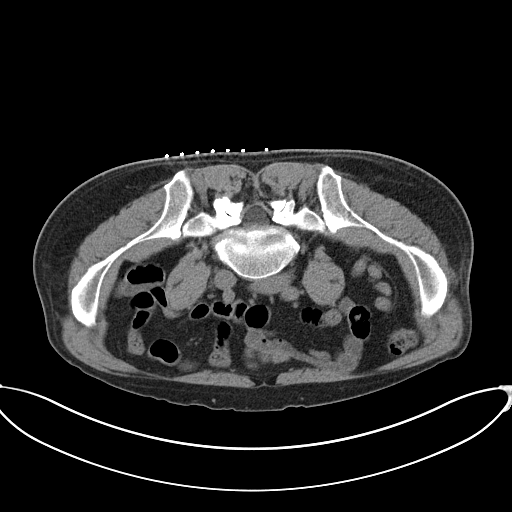
[im 31/40  soft-tissue]
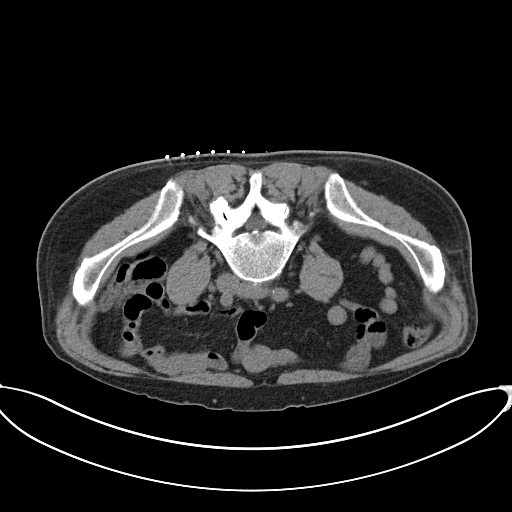
[im 34/40  soft-tissue]
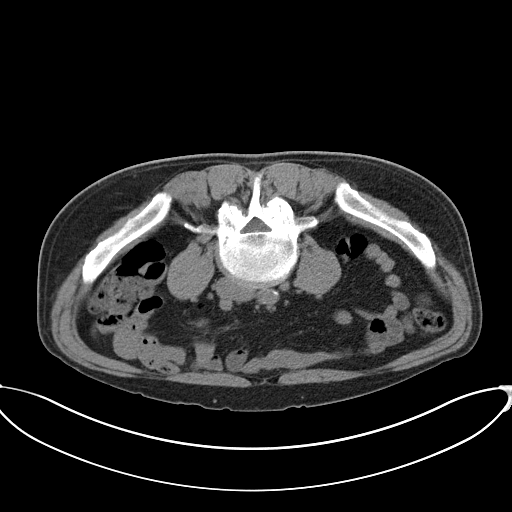
[im 37/40  soft-tissue]
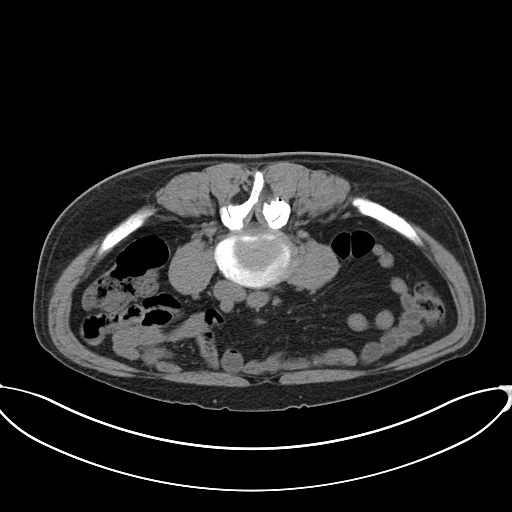
[im 37/40  bone]
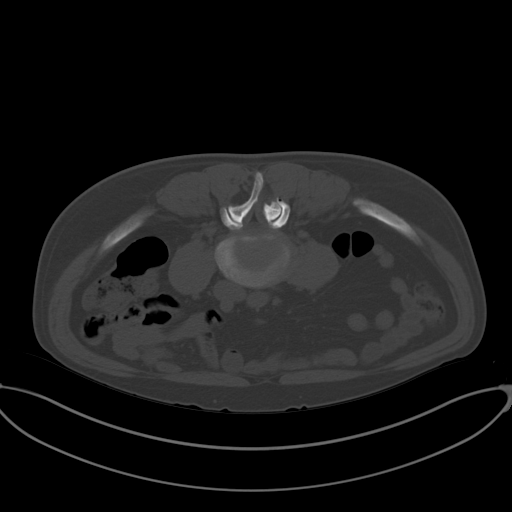

[13 of 16 positions shown; findings below may reference images not displayed]

EXAM:
CT GUIDED BONE MARROW ASPIRATION AND BIOPSY

ANESTHESIA/SEDATION:
Versed 2.0 mg IV, Fentanyl 100 mcg IV

Total Moderate Sedation Time:  13 minutes.

The patient's level of consciousness and physiologic status were
continuously monitored during the procedure by Radiology nursing.

PROCEDURE:
The procedure risks, benefits, and alternatives were explained to
the patient. Questions regarding the procedure were encouraged and
answered. The patient understands and consents to the procedure. A
time out was performed prior to initiating the procedure.

The right gluteal region was prepped with chlorhexidine. Sterile
gown and sterile gloves were used for the procedure. Local
anesthesia was provided with 1% Lidocaine.

Under CT guidance, an 11 gauge On Control bone cutting needle was
advanced from a posterior approach into the right iliac bone. Needle
positioning was confirmed with CT. Initial non heparinized and
heparinized aspirate samples were obtained of bone marrow. Core
biopsy was performed via the On Control drill needle.

COMPLICATIONS:
None
FINDINGS: Inspection of initial aspirate did reveal visible particles. Intact
core biopsy sample was obtained.
IMPRESSION: CT guided bone marrow biopsy of right posterior iliac bone with both
aspirate and core samples obtained.

## 2019-08-11 ENCOUNTER — Encounter (HOSPITAL_COMMUNITY): Payer: Self-pay

## 2019-08-16 MED FILL — IMBRUVICA 420 MG TAB: 420 | 28 days supply | Qty: 28 | Fill #3

## 2019-09-07 ENCOUNTER — Telehealth: Payer: Self-pay | Admitting: Hematology

## 2019-09-07 NOTE — Telephone Encounter (Signed)
Called pt per 4/22 sch message - no answer left message for pt to call back

## 2019-09-14 MED FILL — IMBRUVICA 420 MG TAB: 420 | 28 days supply | Qty: 28 | Fill #4

## 2019-09-25 ENCOUNTER — Other Ambulatory Visit: Payer: PPO

## 2019-09-25 ENCOUNTER — Ambulatory Visit: Payer: PPO | Admitting: Hematology

## 2019-09-25 DIAGNOSIS — R351 Nocturia: Secondary | ICD-10-CM | POA: Diagnosis not present

## 2019-09-25 DIAGNOSIS — R3121 Asymptomatic microscopic hematuria: Secondary | ICD-10-CM | POA: Diagnosis not present

## 2019-09-25 DIAGNOSIS — R8271 Bacteriuria: Secondary | ICD-10-CM | POA: Diagnosis not present

## 2019-09-25 DIAGNOSIS — N401 Enlarged prostate with lower urinary tract symptoms: Secondary | ICD-10-CM | POA: Diagnosis not present

## 2019-09-28 DIAGNOSIS — H524 Presbyopia: Secondary | ICD-10-CM | POA: Diagnosis not present

## 2019-10-02 ENCOUNTER — Inpatient Hospital Stay: Payer: PPO | Attending: Hematology and Oncology

## 2019-10-02 ENCOUNTER — Other Ambulatory Visit: Payer: Self-pay

## 2019-10-02 ENCOUNTER — Inpatient Hospital Stay (HOSPITAL_BASED_OUTPATIENT_CLINIC_OR_DEPARTMENT_OTHER): Payer: PPO | Admitting: Hematology

## 2019-10-02 VITALS — BP 159/81 | HR 70 | Temp 98.5°F | Resp 18 | Ht 72.0 in | Wt 193.1 lb

## 2019-10-02 DIAGNOSIS — D696 Thrombocytopenia, unspecified: Secondary | ICD-10-CM

## 2019-10-02 DIAGNOSIS — C9110 Chronic lymphocytic leukemia of B-cell type not having achieved remission: Secondary | ICD-10-CM | POA: Insufficient documentation

## 2019-10-02 LAB — CMP (CANCER CENTER ONLY)
ALT: 26 U/L (ref 0–44)
AST: 14 U/L — ABNORMAL LOW (ref 15–41)
Albumin: 3.8 g/dL (ref 3.5–5.0)
Alkaline Phosphatase: 95 U/L (ref 38–126)
Anion gap: 9 (ref 5–15)
BUN: 17 mg/dL (ref 8–23)
CO2: 22 mmol/L (ref 22–32)
Calcium: 8.7 mg/dL — ABNORMAL LOW (ref 8.9–10.3)
Chloride: 110 mmol/L (ref 98–111)
Creatinine: 1.96 mg/dL — ABNORMAL HIGH (ref 0.61–1.24)
GFR, Est AFR Am: 40 mL/min — ABNORMAL LOW (ref >60)
GFR, Estimated: 34 mL/min — ABNORMAL LOW (ref >60)
Glucose, Bld: 80 mg/dL (ref 70–99)
Potassium: 4.2 mmol/L (ref 3.5–5.1)
Sodium: 141 mmol/L (ref 135–145)
Total Bilirubin: 0.5 mg/dL (ref 0.3–1.2)
Total Protein: 6.8 g/dL (ref 6.5–8.1)

## 2019-10-02 LAB — CBC WITH DIFFERENTIAL/PLATELET
Abs Immature Granulocytes: 0.03 10*3/uL (ref 0.00–0.07)
Basophils Absolute: 0.1 10*3/uL (ref 0.0–0.1)
Basophils Relative: 0 %
Eosinophils Absolute: 0.1 10*3/uL (ref 0.0–0.5)
Eosinophils Relative: 1 %
HCT: 47 % (ref 39.0–52.0)
Hemoglobin: 15.3 g/dL (ref 13.0–17.0)
Immature Granulocytes: 0 %
Lymphocytes Relative: 71 %
Lymphs Abs: 12 10*3/uL — ABNORMAL HIGH (ref 0.7–4.0)
MCH: 29 pg (ref 26.0–34.0)
MCHC: 32.6 g/dL (ref 30.0–36.0)
MCV: 89 fL (ref 80.0–100.0)
Monocytes Absolute: 0.5 10*3/uL (ref 0.1–1.0)
Monocytes Relative: 3 %
Neutro Abs: 4.3 10*3/uL (ref 1.7–7.7)
Neutrophils Relative %: 25 %
Platelets: 116 10*3/uL — ABNORMAL LOW (ref 150–400)
RBC: 5.28 MIL/uL (ref 4.22–5.81)
RDW: 14.3 % (ref 11.5–15.5)
WBC: 16.9 10*3/uL — ABNORMAL HIGH (ref 4.0–10.5)
nRBC: 0 % (ref 0.0–0.2)

## 2019-10-02 LAB — LACTATE DEHYDROGENASE: LDH: 131 U/L (ref 98–192)

## 2019-10-02 NOTE — Progress Notes (Signed)
HEMATOLOGY/ONCOLOGY CLINIC NOTE  Date of Service: 10/02/2019  Patient Care Team: Patient, No Pcp Per as PCP - General (General Practice)  CHIEF COMPLAINTS/PURPOSE OF CONSULTATION:  F/u for Chronic Lymphocytic Leukemia   Oncologic History:  [name] with CLL after a CLL FISH study revealed heterozygous 13q deletion within 92% cells. Subsequent imaging showed diffuse non-bulky lymphadenopathy in chest, abdomen, and pelvis with splenomegaly of 1200 cm^3. The pt began daily Ibrutinib 420mg  on 01/27/17 after becoming increasing fatigued and lost 15 pounds of weight over 4-5 months.   HISTORY OF PRESENTING ILLNESS:   Wesley Bright is a wonderful 69 y.o. male who has been previously seen by my colleague Dr Wesley Bright for evaluation and management of Chronic Lymphocytic Leukemia. He is accompanied today by his daughter and wife. The pt reports that he is doing well overall.   The pt reports that he is feeling better and his energy levels have been improving. He has continued to take Vitamin D, Vitamin B12 and co-enzyme Q. He notes no problems taking his Ibrutinib besides mild rashes that come and go without itching or bothering him very much.   He notes that he has had problems in the past with fluid in the abdomen, and has taken medication for this. He has also intermittently had constipation.   Most recent lab results (11/03/17) of CBC  is as follows: all values are WNL except for WBC at 30.3k, HGB at 12.3, HCT at 37.9, RDW at 15.0, PLT at 109k, Lymphs abs at 26.8k, Creatinine at 1.62, Total Protein at 6.1, Total bilirubin at <0.2, GFR at 43. LDH 6/19;19 is WNL at 164 Uric Acid 11/03/17 is WNL at 4.3  On review of systems, pt reports mild skin rashes, increasing energy levels, and denies mouth sores, fevers, chills, diarrhea, inguinal rashes, itching, vision changes, abdominal pains, leg swelling, and any other symptoms.   Interval History:  Wesley Bright returns  today for management and evaluation of his CLL. We are joined today by his daughter, Wesley Bright, via phone. The patient's last visit with [place] was on 06/27/2019. The pt reports that he is doing well overall.  The pt reports that he is scheduled for a cystoscopy within the next month. Pt has spoken with Dr. Royce Bright since his kidney biopsy on 06/20/19. She increased his dose of Amlodipine and he continues to be on Flomax. He is scheduled to see Dr. Royce Bright again in September. Pt currently smokes 10-12 cigarettes per day. He has received his COVID19 vaccination.   Lab results today (10/02/19) of CBC w/diff and CMP is as follows: all values are WNL except for WBC at 16.9K, PLT at 116K, Lymph Abs at 12.0K, Smear Review shows "Variant Lymphs", Smudge Cells are "Present", Creatinine at 1.96, Calcium at 8.7, AST at 14, GFR Est Non Af Am at 34. 10/02/2019 LDH at 131 06/20/2019 Kidney biopsy revealed "Small biopsy sample with ultrastructural evidence of thin glomerular basement membrane disease. Arterionephrosclerosis with moderate to severe arteriosclerosis and moderate tubulointerstitial scarring. No evidence of an immune-complex-mediated or active glomerulonephritis/renal disease."  On review of systems, pt reports bruising and denies dysuria, fevers, chills, night sweats, abdominal pain and any other symptoms.    MEDICAL HISTORY:  Past Medical History:  Diagnosis Date  . External hemorrhoid     SURGICAL HISTORY: Past Surgical History:  Procedure Laterality Date  . APPENDECTOMY    . INGUINAL HERNIA REPAIR     left  . KNEE SURGERY  Left Knee    SOCIAL HISTORY: Social History   Socioeconomic History  . Marital status: Married    Spouse name: Not on file  . Number of children: 2  . Years of education: Not on file  . Highest education level: Not on file  Occupational History  . Occupation: Buyer, retail: [employer]  Tobacco Use  . Smoking status: Current Every Day Smoker      Packs/day: 0.50  . Smokeless tobacco: Never Used  Substance and Sexual Activity  . Alcohol use: Yes    Comment: occasional  . Drug use: No  . Sexual activity: Not on file  Other Topics Concern  . Not on file  Social History Narrative  . Not on file   Social Determinants of Health   Financial Resource Strain:   . Difficulty of Paying Living Expenses:   Food Insecurity:   . Worried About Charity fundraiser in the Last Year:   . Arboriculturist in the Last Year:   Transportation Needs:   . Film/video editor (Medical):   Marland Kitchen Lack of Transportation (Non-Medical):   Physical Activity:   . Days of Exercise per Week:   . Minutes of Exercise per Session:   Stress:   . Feeling of Stress :   Social Connections:   . Frequency of Communication with Friends and Family:   . Frequency of Social Gatherings with Friends and Family:   . Attends Religious Services:   . Active Member of Clubs or Organizations:   . Attends Archivist Meetings:   Marland Kitchen Marital Status:   Intimate Partner Violence:   . Fear of Current or Ex-Partner:   . Emotionally Abused:   Marland Kitchen Physically Abused:   . Sexually Abused:     FAMILY HISTORY: Family History  Problem Relation Age of Onset  . Liver cancer Brother   . Cancer Brother        Liver  . Esophageal cancer Father   . Cancer Father        Throat   . Cancer Mother        Bladder - cause of death    ALLERGIES:  has No Known Allergies.  MEDICATIONS:  Current Outpatient Medications  Medication Sig Dispense Refill  . amLODipine (NORVASC) 5 MG tablet Take 5 mg by mouth daily.    . cholecalciferol (VITAMIN D) 1000 units tablet Take 1,000 Units by mouth daily.    Marland Kitchen co-enzyme Q-10 30 MG capsule Take 30 mg by mouth 3 (three) times daily.    . Ibrutinib (IMBRUVICA) 420 MG TABS Take 420 mg by mouth daily. Take with a glass of water at approximately the same time each day. 28 tablet 11  . tamsulosin (FLOMAX) 0.4 MG CAPS capsule Take 0.4 mg by  mouth daily.    . vitamin B-12 (CYANOCOBALAMIN) 100 MCG tablet Take 100 mcg by mouth daily.     No current facility-administered medications for this visit.    REVIEW OF SYSTEMS:   A 10+ POINT REVIEW OF SYSTEMS WAS OBTAINED including neurology, dermatology, psychiatry, cardiac, respiratory, lymph, extremities, GI, GU, Musculoskeletal, constitutional, breasts, reproductive, HEENT.  All pertinent positives are noted in the HPI.  All others are negative.   PHYSICAL EXAMINATION: ECOG PERFORMANCE STATUS: 1 - Symptomatic but completely ambulatory  Vitals:   10/02/19 1434  BP: (!) 159/81  Pulse: 70  Resp: 18  Temp: 98.5 F (36.9 C)  SpO2: 99%   Filed  Weights   10/02/19 1434  Weight: 193 lb 1.6 oz (87.6 kg)   .Body mass index is 26.19 kg/m.   GENERAL:alert, in no acute distress and comfortable SKIN: no acute rashes, no significant lesions EYES: conjunctiva are pink and non-injected, sclera anicteric OROPHARYNX: MMM, no exudates, no oropharyngeal erythema or ulceration NECK: supple, no JVD LYMPH:  no palpable lymphadenopathy in the cervical, axillary or inguinal regions LUNGS: clear to auscultation b/l with normal respiratory effort HEART: regular rate & rhythm ABDOMEN:  normoactive bowel sounds , non tender, not distended. No palpable hepatosplenomegaly.  Extremity: no pedal edema PSYCH: alert & oriented x 3 with fluent speech NEURO: no focal motor/sensory deficits  LABORATORY DATA:  I have reviewed the data as listed  . CBC Latest Ref Rng & Units 10/02/2019 06/27/2019 06/20/2019  WBC 4.0 - 10.5 K/uL 16.9(H) 15.4(H) 15.5(H)  Hemoglobin 13.0 - 17.0 g/dL 15.3 15.0 14.3  Hematocrit 39.0 - 52.0 % 47.0 46.4 43.8  Platelets 150 - 400 K/uL 116(L) 115(L) 103(L)    . CMP Latest Ref Rng & Units 10/02/2019 06/27/2019 06/20/2019  Glucose 70 - 99 mg/dL 80 90 96  BUN 8 - 23 mg/dL 17 17 17   Creatinine 0.61 - 1.24 mg/dL 1.96(H) 1.73(H) 1.73(H)  Sodium 135 - 145 mmol/L 141 140 140  Potassium  3.5 - 5.1 mmol/L 4.2 4.6 4.0  Chloride 98 - 111 mmol/L 110 109 110  CO2 22 - 32 mmol/L 22 24 21(L)  Calcium 8.9 - 10.3 mg/dL 8.7(L) 8.7(L) 8.5(L)  Total Protein 6.5 - 8.1 g/dL 6.8 6.6 -  Total Bilirubin 0.3 - 1.2 mg/dL 0.5 0.4 -  Alkaline Phos 38 - 126 U/L 95 107 -  AST 15 - 41 U/L 14(L) 10(L) -  ALT 0 - 44 U/L 26 13 -   06/20/2019 Kidney Biopsy:    12/31/16 Flow Cytometry:    12/15/16 CLL FISH:    RADIOGRAPHIC STUDIES: I have personally reviewed the radiological images as listed and agreed with the findings in the report. No results found.  ASSESSMENT & PLAN:  69 y.o. male with  1. Chronic Lymphocytic Leukemia - monoalleilic 0000000 deletion. Was started on Ibrutinib from 01/2017 due to fatigue, weight loss and significant splenomegaly.  2. H/o Grade 1 intermittent rash ?related to Ibrutinib - no currently an issue.  PLAN: -Discussed pt labwork today, 10/02/19; WBC and PLT are stable, no anemia, kidney numbers have increased some, electrolytes look good, LDH is WNL -Discussed 06/20/2019 Kidney biopsy which revealed "Small biopsy sample with ultrastructural evidence of thin glomerular basement membrane disease. Arterionephrosclerosis with moderate to severe arteriosclerosis and moderate tubulointerstitial scarring. No evidence of an immune-complex-mediated or active glomerulonephritis/renal disease." -The pt shows no clinical or lab progression of CLL at this time -The pt has no prohibitive toxicities from continuing Ibrutinib at this time -Advised pt that there are changes to his kidney function that are thought to be caused by HTN -Advised pt that Ibrutinib can cause HTN and increased bleeding. Would like to consider IV treatment with the goal to get pt into remission and discontinue long-term Ibrutinib - which would decrease the risk of ongoing kidney injury.  -Discussed IV immunotherapy options that could potentially put pt into remission -Pt will discuss treatment options with  family. Will discuss further at next visit. -Recommend pt f/u with Dr. Royce Bright CKD management -Recommend pt f/u with Urologist for cystoscopy as scheduled -Will see back in 3 months with labs    3.  Patient Active Problem List  Diagnosis Date Noted  . Skin rash 10/03/2017  . CLL (chronic lymphocytic leukemia) ([place]) 12/09/2016  . General weakness 12/07/2016  . Smoker 12/07/2016  . Loss of weight 12/07/2016  . GERD (gastroesophageal reflux disease) 12/30/2010  . Constipation 12/30/2010  . Hemorrhoids, external 12/30/2010    FOLLOW UP: RTC with Dr Irene Limbo with labs in 3 months   The total time spent in the appt was 20 minutes and more than 50% was on counseling and direct patient cares.  All of the patient's questions were answered with apparent satisfaction. The patient knows to call the clinic with any problems, questions or concerns.   Sullivan Lone MD [place] AAHIVMS [facility] Hematology/Oncology Physician [facility]  (Office):       [phone] (Work cell):  [phone] (Fax):           [phone]  10/02/2019 4:56 PM  I, Yevette Edwards, am acting as a scribe for Dr. Sullivan Lone.   .I have reviewed the above documentation for accuracy and completeness, and I agree with the above. Brunetta Genera MD

## 2019-10-03 ENCOUNTER — Telehealth: Payer: Self-pay | Admitting: Hematology

## 2019-10-03 NOTE — Telephone Encounter (Signed)
Scheduled per 05/17 los, spoke with patient's wife and he will be notified of his upcoming appointment.

## 2019-10-12 MED FILL — IMBRUVICA 420 MG TAB: 420 | 28 days supply | Qty: 28 | Fill #5

## 2019-10-18 DIAGNOSIS — R351 Nocturia: Secondary | ICD-10-CM | POA: Diagnosis not present

## 2019-10-18 DIAGNOSIS — N401 Enlarged prostate with lower urinary tract symptoms: Secondary | ICD-10-CM | POA: Diagnosis not present

## 2019-10-18 DIAGNOSIS — R3121 Asymptomatic microscopic hematuria: Secondary | ICD-10-CM | POA: Diagnosis not present

## 2019-11-08 MED FILL — IMBRUVICA 420 MG TAB: 420 | 28 days supply | Qty: 28 | Fill #6

## 2019-12-06 MED FILL — IMBRUVICA 420 MG TAB: 420 | 28 days supply | Qty: 28 | Fill #7

## 2019-12-28 ENCOUNTER — Other Ambulatory Visit: Payer: Self-pay | Admitting: *Deleted

## 2019-12-28 DIAGNOSIS — C9110 Chronic lymphocytic leukemia of B-cell type not having achieved remission: Secondary | ICD-10-CM

## 2020-01-01 ENCOUNTER — Other Ambulatory Visit: Payer: Self-pay

## 2020-01-01 ENCOUNTER — Inpatient Hospital Stay: Payer: PPO | Attending: Hematology and Oncology

## 2020-01-01 ENCOUNTER — Inpatient Hospital Stay (HOSPITAL_BASED_OUTPATIENT_CLINIC_OR_DEPARTMENT_OTHER): Payer: PPO | Admitting: Hematology

## 2020-01-01 VITALS — BP 137/99 | HR 56 | Temp 97.5°F | Resp 18 | Ht 72.0 in | Wt 190.1 lb

## 2020-01-01 DIAGNOSIS — C9110 Chronic lymphocytic leukemia of B-cell type not having achieved remission: Secondary | ICD-10-CM | POA: Insufficient documentation

## 2020-01-01 DIAGNOSIS — Z23 Encounter for immunization: Secondary | ICD-10-CM | POA: Insufficient documentation

## 2020-01-01 DIAGNOSIS — Z7189 Other specified counseling: Secondary | ICD-10-CM | POA: Diagnosis not present

## 2020-01-01 DIAGNOSIS — D696 Thrombocytopenia, unspecified: Secondary | ICD-10-CM | POA: Diagnosis not present

## 2020-01-01 LAB — CBC WITH DIFFERENTIAL (CANCER CENTER ONLY)
Abs Immature Granulocytes: 0.02 10*3/uL (ref 0.00–0.07)
Basophils Absolute: 0.1 10*3/uL (ref 0.0–0.1)
Basophils Relative: 0 %
Eosinophils Absolute: 0.1 10*3/uL (ref 0.0–0.5)
Eosinophils Relative: 1 %
HCT: 45.7 % (ref 39.0–52.0)
Hemoglobin: 14.8 g/dL (ref 13.0–17.0)
Immature Granulocytes: 0 %
Lymphocytes Relative: 69 %
Lymphs Abs: 10.6 10*3/uL — ABNORMAL HIGH (ref 0.7–4.0)
MCH: 29.5 pg (ref 26.0–34.0)
MCHC: 32.4 g/dL (ref 30.0–36.0)
MCV: 91 fL (ref 80.0–100.0)
Monocytes Absolute: 0.5 10*3/uL (ref 0.1–1.0)
Monocytes Relative: 4 %
Neutro Abs: 3.9 10*3/uL (ref 1.7–7.7)
Neutrophils Relative %: 26 %
Platelet Count: 115 10*3/uL — ABNORMAL LOW (ref 150–400)
RBC: 5.02 MIL/uL (ref 4.22–5.81)
RDW: 14.4 % (ref 11.5–15.5)
WBC Count: 15.2 10*3/uL — ABNORMAL HIGH (ref 4.0–10.5)
nRBC: 0 % (ref 0.0–0.2)

## 2020-01-01 LAB — CMP (CANCER CENTER ONLY)
ALT: 15 U/L (ref 0–44)
AST: 12 U/L — ABNORMAL LOW (ref 15–41)
Albumin: 3.7 g/dL (ref 3.5–5.0)
Alkaline Phosphatase: 94 U/L (ref 38–126)
Anion gap: 7 (ref 5–15)
BUN: 18 mg/dL (ref 8–23)
CO2: 22 mmol/L (ref 22–32)
Calcium: 9 mg/dL (ref 8.9–10.3)
Chloride: 110 mmol/L (ref 98–111)
Creatinine: 1.71 mg/dL — ABNORMAL HIGH (ref 0.61–1.24)
GFR, Est AFR Am: 47 mL/min — ABNORMAL LOW (ref >60)
GFR, Estimated: 41 mL/min — ABNORMAL LOW (ref >60)
Glucose, Bld: 91 mg/dL (ref 70–99)
Potassium: 4.2 mmol/L (ref 3.5–5.1)
Sodium: 139 mmol/L (ref 135–145)
Total Bilirubin: 0.4 mg/dL (ref 0.3–1.2)
Total Protein: 6.6 g/dL (ref 6.5–8.1)

## 2020-01-01 LAB — LACTATE DEHYDROGENASE: LDH: 130 U/L (ref 98–192)

## 2020-01-01 NOTE — Progress Notes (Signed)
HEMATOLOGY/ONCOLOGY CLINIC NOTE  Date of Service: 01/01/2020  Patient Care Team: Patient, No Pcp Per as PCP - General (General Practice)  CHIEF COMPLAINTS/PURPOSE OF CONSULTATION:  F/u for Chronic Lymphocytic Leukemia   Oncologic History:  Wesley Bright was initially diagnosed with CLL after a CLL FISH study revealed heterozygous 13q deletion within 92% cells. Subsequent imaging showed diffuse non-bulky lymphadenopathy in chest, abdomen, and pelvis with splenomegaly of 1200 cm^3. The pt began daily Ibrutinib 420mg  on 01/27/17 after becoming increasing fatigued and lost 15 pounds of weight over 4-5 months.   HISTORY OF PRESENTING ILLNESS:   Wesley Bright is a wonderful 69 y.o. male who has been previously seen by my colleague Dr Grace Isaac for evaluation and management of Chronic Lymphocytic Leukemia. He is accompanied today by his daughter and wife. The pt reports that he is doing well overall.   The pt reports that he is feeling better and his energy levels have been improving. He has continued to take Vitamin D, Vitamin B12 and co-enzyme Q. He notes no problems taking his Ibrutinib besides mild rashes that come and go without itching or bothering him very much.   He notes that he has had problems in the past with fluid in the abdomen, and has taken medication for this. He has also intermittently had constipation.   Most recent lab results (11/03/17) of CBC  is as follows: all values are WNL except for WBC at 30.3k, HGB at 12.3, HCT at 37.9, RDW at 15.0, PLT at 109k, Lymphs abs at 26.8k, Creatinine at 1.62, Total Protein at 6.1, Total bilirubin at <0.2, GFR at 43. LDH 6/19;19 is WNL at 164 Uric Acid 11/03/17 is WNL at 4.3  On review of systems, pt reports mild skin rashes, increasing energy levels, and denies mouth sores, fevers, chills, diarrhea, inguinal rashes, itching, vision changes, abdominal pains, leg swelling, and any other symptoms.   Interval History:  Wesley Bright returns  today for management and evaluation of his CLL. We are joined today by his daughter. The patient's last visit with [place] was on 10/02/2019. The pt reports that he is doing well overall.  The pt reports that he has been stable and denies any issues with Ibrutinib. Pt has an appointment with the Nephrologist next week. They are continuing to monitor his kidney function. Pt was told that mis medication assistance for Ibrutinib will run out in October. Pt has received the Pfizer vaccine and is interested in receiving a booster.  Lab results today (01/01/20) of CBC w/diff and CMP is as follows: all values are WNL except for WBC at 15.2K, PLT at 115, Lymphs Abs at 10.6K, WBC Morphology shows "Variant Lymphs", Creatinine at 1.71, AST at 12, GFR Est Non Af Am at 41. 01/01/2020 LDH at 130  On review of systems, pt denies fevers, chills, night sweats, mouth sores, diarrhea, abdominal pain, unexpected weight loss, testicular pain/swelling and any other symptoms.   MEDICAL HISTORY:  Past Medical History:  Diagnosis Date  . External hemorrhoid     SURGICAL HISTORY: Past Surgical History:  Procedure Laterality Date  . APPENDECTOMY    . INGUINAL HERNIA REPAIR     left  . KNEE SURGERY     Left Knee    SOCIAL HISTORY: Social History   Socioeconomic History  . Marital status: Married    Spouse name: Not on file  . Number of children: 2  . Years of education: Not on file  . Highest education level:  Not on file  Occupational History  . Occupation: Buyer, retail: [employer]  Tobacco Use  . Smoking status: Current Every Day Smoker    Packs/day: 0.50  . Smokeless tobacco: Never Used  Vaping Use  . Vaping Use: Never used  Substance and Sexual Activity  . Alcohol use: Yes    Comment: occasional  . Drug use: No  . Sexual activity: Not on file  Other Topics Concern  . Not on file  Social History Narrative  . Not on file   Social Determinants of Health   Financial Resource  Strain:   . Difficulty of Paying Living Expenses:   Food Insecurity:   . Worried About Charity fundraiser in the Last Year:   . Arboriculturist in the Last Year:   Transportation Needs:   . Film/video editor (Medical):   Marland Kitchen Lack of Transportation (Non-Medical):   Physical Activity:   . Days of Exercise per Week:   . Minutes of Exercise per Session:   Stress:   . Feeling of Stress :   Social Connections:   . Frequency of Communication with Friends and Family:   . Frequency of Social Gatherings with Friends and Family:   . Attends Religious Services:   . Active Member of Clubs or Organizations:   . Attends Archivist Meetings:   Marland Kitchen Marital Status:   Intimate Partner Violence:   . Fear of Current or Ex-Partner:   . Emotionally Abused:   Marland Kitchen Physically Abused:   . Sexually Abused:     FAMILY HISTORY: Family History  Problem Relation Age of Onset  . Liver cancer Brother   . Cancer Brother        Liver  . Esophageal cancer Father   . Cancer Father        Throat   . Cancer Mother        Bladder - cause of death    ALLERGIES:  has No Known Allergies.  MEDICATIONS:  Current Outpatient Medications  Medication Sig Dispense Refill  . amLODipine (NORVASC) 5 MG tablet Take 5 mg by mouth daily.    . cholecalciferol (VITAMIN D) 1000 units tablet Take 1,000 Units by mouth daily.    Marland Kitchen co-enzyme Q-10 30 MG capsule Take 30 mg by mouth 3 (three) times daily.    . Ibrutinib (IMBRUVICA) 420 MG TABS Take 420 mg by mouth daily. Take with a glass of water at approximately the same time each day. 28 tablet 11  . tamsulosin (FLOMAX) 0.4 MG CAPS capsule Take 0.4 mg by mouth daily.    . vitamin B-12 (CYANOCOBALAMIN) 100 MCG tablet Take 100 mcg by mouth daily.     No current facility-administered medications for this visit.    REVIEW OF SYSTEMS:   A 10+ POINT REVIEW OF SYSTEMS WAS OBTAINED including neurology, dermatology, psychiatry, cardiac, respiratory, lymph, extremities, GI,  GU, Musculoskeletal, constitutional, breasts, reproductive, HEENT.  All pertinent positives are noted in the HPI.  All others are negative.   PHYSICAL EXAMINATION: ECOG PERFORMANCE STATUS: 1 - Symptomatic but completely ambulatory  Vitals:   01/01/20 1543  BP: (!) 137/99  Pulse: (!) 56  Resp: 18  Temp: (!) 97.5 F (36.4 C)  SpO2: 100%   Filed Weights   01/01/20 1543  Weight: 190 lb 1.6 oz (86.2 kg)   .Body mass index is 25.78 kg/m.   GENERAL:alert, in no acute distress and comfortable SKIN: no acute rashes, no  significant lesions EYES: conjunctiva are pink and non-injected, sclera anicteric OROPHARYNX: MMM, no exudates, no oropharyngeal erythema or ulceration NECK: supple, no JVD LYMPH:  no palpable lymphadenopathy in the cervical, axillary or inguinal regions LUNGS: clear to auscultation b/l with normal respiratory effort HEART: regular rate & rhythm ABDOMEN:  normoactive bowel sounds , non tender, not distended. No palpable hepatosplenomegaly.  Extremity: no pedal edema PSYCH: alert & oriented x 3 with fluent speech NEURO: no focal motor/sensory deficits  LABORATORY DATA:  I have reviewed the data as listed  . CBC Latest Ref Rng & Units 01/01/2020 10/02/2019 06/27/2019  WBC 4.0 - 10.5 K/uL 15.2(H) 16.9(H) 15.4(H)  Hemoglobin 13.0 - 17.0 g/dL 14.8 15.3 15.0  Hematocrit 39 - 52 % 45.7 47.0 46.4  Platelets 150 - 400 K/uL 115(L) 116(L) 115(L)    . CMP Latest Ref Rng & Units 01/01/2020 10/02/2019 06/27/2019  Glucose 70 - 99 mg/dL 91 80 90  BUN 8 - 23 mg/dL 18 17 17   Creatinine 0.61 - 1.24 mg/dL 1.71(H) 1.96(H) 1.73(H)  Sodium 135 - 145 mmol/L 139 141 140  Potassium 3.5 - 5.1 mmol/L 4.2 4.2 4.6  Chloride 98 - 111 mmol/L 110 110 109  CO2 22 - 32 mmol/L 22 22 24   Calcium 8.9 - 10.3 mg/dL 9.0 8.7(L) 8.7(L)  Total Protein 6.5 - 8.1 g/dL 6.6 6.8 6.6  Total Bilirubin 0.3 - 1.2 mg/dL 0.4 0.5 0.4  Alkaline Phos 38 - 126 U/L 94 95 107  AST 15 - 41 U/L 12(L) 14(L) 10(L)  ALT 0 -  44 U/L 15 26 13    06/20/2019 Kidney Biopsy:    12/31/16 Flow Cytometry:    12/15/16 CLL FISH:    RADIOGRAPHIC STUDIES: I have personally reviewed the radiological images as listed and agreed with the findings in the report. No results found.  ASSESSMENT & PLAN:  69 y.o. male with  1. Chronic Lymphocytic Leukemia - monoalleilic 00L deletion. Was started on Ibrutinib from 01/2017 due to fatigue, weight loss and significant splenomegaly.  2. H/o Grade 1 intermittent rash ?related to Ibrutinib - no currently an issue.  PLAN: -Discussed pt labwork today, 01/01/20; WBC have improved, PLT are low but stable - could be from Ibrutinib, no anemia, Creatinine is elevated but stable, LDH is WNL -No lab or clinical evidence of CLL progression at this time.  -The pt has no prohibitive toxicities from continuing Ibrutinib at this time. -Advised pt that Ibrutinib has to be continued long-term and could be impacting his kidney function.  -Discussed beginning immunotherapy vs switching to a suppressive dose of Ibrutinib to see if it can hold disease.  -Advised pt that inital treatment for Rituxan would be weekly x4  -Advised pt that the most common side effects of immunotherapy are allergic reaction and immune suppression.  -Advised pt that if he is interested getting the booster dose of the COVID19 vaccine, recommend that he receive it prior to beginning Ritxan for maximum effectiveness.  -Will begin pt on Rituxan on 01/29/20. Will discontinue Ibrutinib at that time.  -Will set up chemo-counseling in 1-2 weeks -Recommend pt f/u with Dr. Royce Macadamia as scheduled -Will see back in 4 weeks with labs    3.  Patient Active Problem List   Diagnosis Date Noted  . Skin rash 10/03/2017  . CLL (chronic lymphocytic leukemia) ([place]) 12/09/2016  . General weakness 12/07/2016  . Smoker 12/07/2016  . Loss of weight 12/07/2016  . GERD (gastroesophageal reflux disease) 12/30/2010  . Constipation 12/30/2010  .  Hemorrhoids, external 12/30/2010    FOLLOW UP: Chemo-counseling for Rituxan weekly x 4 doses in 1-2 weeks Schedule to start weekly Rituxan x 4 doses from 01/29/2020. (Patient can only do mondays) Labs with each treatment MD visit with Dose 1, 2 and 4   The total time spent in the appt was 30 minutes and more than 50% was on counseling and direct patient cares, ordering and mx of Rituxan  All of the patient's questions were answered with apparent satisfaction. The patient knows to call the clinic with any problems, questions or concerns.   Sullivan Lone MD [place] AAHIVMS [place] [facility] Hematology/Oncology Physician [facility]  (Office):       [phone] (Work cell):  [phone] (Fax):           [phone]  01/01/2020 4:20 PM  I, Yevette Edwards, am acting as a scribe for Dr. Sullivan Lone.   .I have reviewed the above documentation for accuracy and completeness, and I agree with the above. Brunetta Genera MD

## 2020-01-02 ENCOUNTER — Encounter: Payer: Self-pay | Admitting: Hematology

## 2020-01-03 MED FILL — IMBRUVICA 420 MG TAB: 420 | 28 days supply | Qty: 28 | Fill #8

## 2020-01-08 DIAGNOSIS — N1832 Chronic kidney disease, stage 3b: Secondary | ICD-10-CM | POA: Diagnosis not present

## 2020-01-08 DIAGNOSIS — Z7189 Other specified counseling: Secondary | ICD-10-CM | POA: Insufficient documentation

## 2020-01-08 NOTE — Progress Notes (Signed)
START OFF PATHWAY REGIMEN - Lymphoma and CLL   [id number]:Rituximab (IV/Subcut) D1 Weekly:   A cycle is every 7 days:     Rituximab-xxxx      Rituximab and hyaluronidase human   **Always confirm dose/schedule in your pharmacy ordering system**  Patient Characteristics: Chronic Lymphocytic Leukemia (CLL), Treatment Indicated, Second Line Disease Type: Chronic Lymphocytic Leukemia (CLL) Disease Type: Not Applicable Disease Type: Not Applicable Treatment Indicated<= Treatment Indicated Line of Therapy: Second Line Intent of Therapy: Non-Curative / Palliative Intent, Discussed with Patient

## 2020-01-15 ENCOUNTER — Inpatient Hospital Stay: Payer: PPO

## 2020-01-15 ENCOUNTER — Other Ambulatory Visit: Payer: Self-pay

## 2020-01-15 ENCOUNTER — Encounter: Payer: Self-pay | Admitting: Hematology

## 2020-01-15 DIAGNOSIS — R809 Proteinuria, unspecified: Secondary | ICD-10-CM | POA: Diagnosis not present

## 2020-01-15 DIAGNOSIS — C9110 Chronic lymphocytic leukemia of B-cell type not having achieved remission: Secondary | ICD-10-CM | POA: Diagnosis not present

## 2020-01-15 DIAGNOSIS — N401 Enlarged prostate with lower urinary tract symptoms: Secondary | ICD-10-CM | POA: Diagnosis not present

## 2020-01-15 DIAGNOSIS — Z720 Tobacco use: Secondary | ICD-10-CM | POA: Diagnosis not present

## 2020-01-15 DIAGNOSIS — I129 Hypertensive chronic kidney disease with stage 1 through stage 4 chronic kidney disease, or unspecified chronic kidney disease: Secondary | ICD-10-CM | POA: Diagnosis not present

## 2020-01-15 DIAGNOSIS — R3129 Other microscopic hematuria: Secondary | ICD-10-CM | POA: Diagnosis not present

## 2020-01-15 DIAGNOSIS — Z23 Encounter for immunization: Secondary | ICD-10-CM

## 2020-01-15 DIAGNOSIS — N1832 Chronic kidney disease, stage 3b: Secondary | ICD-10-CM | POA: Diagnosis not present

## 2020-01-15 NOTE — Progress Notes (Signed)
   Covid-19 Vaccination Clinic  Name:  Lyrik Buresh    MRN: [id number] DOB: 1950-12-01  01/15/2020  Mr. Vandegrift was observed post Covid-19 immunization for 15 minutes without incident. He was provided with Vaccine Information Sheet and instruction to access the V-Safe system.   Mr. Ganim was instructed to call 911 with any severe reactions post vaccine: Marland Kitchen Difficulty breathing  . Swelling of face and throat  . A fast heartbeat  . A bad rash all over body  . Dizziness and weakness

## 2020-01-15 NOTE — Progress Notes (Signed)
Received referral message from pharmacy to contact patient daughter regarding assistance for Rituxan.  Called [place] to introduce myself as Arboriculturist and to advise of available copay assistance for his diagnosis through [employer] which may be able to assist with Rituxan. Advised I may apply on behalf of patient but would need exact household income amount. She states since she has that information handy, she will go online and apply. Advised if approved, I would need a copy of the approval letter to have claims submitted to foundation. Provided my contact information.  Discussed one-time $1000 Radio broadcast assistant to assist with personal expenses while going through treatment.  Advised what is needed and if qualifies, we can go over grant on 9/13 at his next visit.  She verbalized understanding.

## 2020-01-16 ENCOUNTER — Telehealth: Payer: Self-pay | Admitting: *Deleted

## 2020-01-16 NOTE — Telephone Encounter (Signed)
Faxed requested information to [place] for co pay assistance Fax confirmation received - Form sent to HIM to be scanned

## 2020-01-26 ENCOUNTER — Other Ambulatory Visit: Payer: Self-pay | Admitting: Hematology

## 2020-01-26 ENCOUNTER — Telehealth: Payer: Self-pay | Admitting: Hematology

## 2020-01-26 DIAGNOSIS — C9110 Chronic lymphocytic leukemia of B-cell type not having achieved remission: Secondary | ICD-10-CM

## 2020-01-26 NOTE — Telephone Encounter (Signed)
Scheduled per 8/16 los. Spoke with pt's wife and is aware of appts added.

## 2020-01-27 NOTE — Progress Notes (Signed)
.   Pharmacist Chemotherapy Monitoring - Initial Assessment    Anticipated start date: 01/30/20   Regimen:  . Are orders appropriate based on the patient's diagnosis, regimen, and cycle? Yes . Does the plan date match the patient's scheduled date? Yes . Is the sequencing of drugs appropriate? Yes . Are the premedications appropriate for the patient's regimen? Yes . Prior Authorization for treatment is: Not Started o If applicable, is the correct biosimilar selected based on the patient's insurance? yes  Organ Function and Labs: Marland Kitchen Are dose adjustments needed based on the patient's renal function, hepatic function, or hematologic function? No . Are appropriate labs ordered prior to the start of patient's treatment? Yes . Other organ system assessment, if indicated: N/A . The following baseline labs, if indicated, have been ordered: rituximab: baseline Hepatitis B labs  Dose Assessment: . Are the drug doses appropriate? Yes . Are the following correct: o Drug concentrations Yes o IV fluid compatible with drug Yes o Administration routes Yes o Timing of therapy Yes . If applicable, does the patient have documented access for treatment and/or plans for port-a-cath placement? no . If applicable, have lifetime cumulative doses been properly documented and assessed? not applicable Lifetime Dose Tracking  No doses have been documented on this patient for the following tracked chemicals: Doxorubicin, Epirubicin, Idarubicin, Daunorubicin, Mitoxantrone, Bleomycin, Oxaliplatin, Carboplatin, Liposomal Doxorubicin  o   Toxicity Monitoring/Prevention: . The patient has the following take home antiemetics prescribed: N/A . The patient has the following take home medications prescribed: N/A . Medication allergies and previous infusion related reactions, if applicable, have been reviewed and addressed. Yes . The patient's current medication list has been assessed for drug-drug interactions with their  chemotherapy regimen. no significant drug-drug interactions were identified on review.  Order Review: . Are the treatment plan orders signed? No . Is the patient scheduled to see a provider prior to their treatment? Yes  I verify that I have reviewed each item in the above checklist and answered each question accordingly.  Wesley Bright 01/27/2020 9:44 AM

## 2020-01-29 ENCOUNTER — Inpatient Hospital Stay (HOSPITAL_BASED_OUTPATIENT_CLINIC_OR_DEPARTMENT_OTHER): Payer: PPO | Admitting: Hematology

## 2020-01-29 ENCOUNTER — Other Ambulatory Visit: Payer: Self-pay

## 2020-01-29 ENCOUNTER — Inpatient Hospital Stay: Payer: PPO | Attending: Hematology and Oncology

## 2020-01-29 VITALS — BP 164/84 | HR 64 | Temp 96.7°F | Resp 18 | Ht 72.0 in | Wt 189.4 lb

## 2020-01-29 DIAGNOSIS — Z23 Encounter for immunization: Secondary | ICD-10-CM | POA: Diagnosis not present

## 2020-01-29 DIAGNOSIS — Z7189 Other specified counseling: Secondary | ICD-10-CM

## 2020-01-29 DIAGNOSIS — C9110 Chronic lymphocytic leukemia of B-cell type not having achieved remission: Secondary | ICD-10-CM

## 2020-01-29 DIAGNOSIS — Z5112 Encounter for antineoplastic immunotherapy: Secondary | ICD-10-CM | POA: Diagnosis not present

## 2020-01-29 LAB — CMP (CANCER CENTER ONLY)
ALT: 16 U/L (ref 0–44)
AST: 14 U/L — ABNORMAL LOW (ref 15–41)
Albumin: 3.8 g/dL (ref 3.5–5.0)
Alkaline Phosphatase: 105 U/L (ref 38–126)
Anion gap: 7 (ref 5–15)
BUN: 15 mg/dL (ref 8–23)
CO2: 24 mmol/L (ref 22–32)
Calcium: 8.9 mg/dL (ref 8.9–10.3)
Chloride: 109 mmol/L (ref 98–111)
Creatinine: 1.62 mg/dL — ABNORMAL HIGH (ref 0.61–1.24)
GFR, Est AFR Am: 50 mL/min — ABNORMAL LOW (ref >60)
GFR, Estimated: 43 mL/min — ABNORMAL LOW (ref >60)
Glucose, Bld: 82 mg/dL (ref 70–99)
Potassium: 3.7 mmol/L (ref 3.5–5.1)
Sodium: 140 mmol/L (ref 135–145)
Total Bilirubin: 0.6 mg/dL (ref 0.3–1.2)
Total Protein: 6.8 g/dL (ref 6.5–8.1)

## 2020-01-29 LAB — CBC WITH DIFFERENTIAL/PLATELET
Abs Immature Granulocytes: 0.02 10*3/uL (ref 0.00–0.07)
Basophils Absolute: 0.1 10*3/uL (ref 0.0–0.1)
Basophils Relative: 1 %
Eosinophils Absolute: 0.1 10*3/uL (ref 0.0–0.5)
Eosinophils Relative: 1 %
HCT: 46.7 % (ref 39.0–52.0)
Hemoglobin: 15.2 g/dL (ref 13.0–17.0)
Immature Granulocytes: 0 %
Lymphocytes Relative: 66 %
Lymphs Abs: 10 10*3/uL — ABNORMAL HIGH (ref 0.7–4.0)
MCH: 29.3 pg (ref 26.0–34.0)
MCHC: 32.5 g/dL (ref 30.0–36.0)
MCV: 90.2 fL (ref 80.0–100.0)
Monocytes Absolute: 0.6 10*3/uL (ref 0.1–1.0)
Monocytes Relative: 4 %
Neutro Abs: 4.3 10*3/uL (ref 1.7–7.7)
Neutrophils Relative %: 28 %
Platelets: 103 10*3/uL — ABNORMAL LOW (ref 150–400)
RBC: 5.18 MIL/uL (ref 4.22–5.81)
RDW: 13.9 % (ref 11.5–15.5)
WBC: 15 10*3/uL — ABNORMAL HIGH (ref 4.0–10.5)
nRBC: 0 % (ref 0.0–0.2)

## 2020-01-29 LAB — HEPATITIS B SURFACE ANTIGEN: Hepatitis B Surface Ag: NONREACTIVE

## 2020-01-29 LAB — HEPATITIS B CORE ANTIBODY, TOTAL: Hep B Core Total Ab: NONREACTIVE

## 2020-01-29 NOTE — Progress Notes (Signed)
HEMATOLOGY/ONCOLOGY CLINIC NOTE  Date of Service: 01/29/2020  Patient Care Team: Patient, No Pcp Per as PCP - General (General Practice)  CHIEF COMPLAINTS/PURPOSE OF CONSULTATION:  F/u for Chronic Lymphocytic Leukemia   Oncologic History:  [name] with CLL after a CLL FISH study revealed heterozygous 13q deletion within 92% cells. Subsequent imaging showed diffuse non-bulky lymphadenopathy in chest, abdomen, and pelvis with splenomegaly of 1200 cm^3. The pt began daily Ibrutinib 420mg  on 01/27/17 after becoming increasing fatigued and lost 15 pounds of weight over 4-5 months.   HISTORY OF PRESENTING ILLNESS:   Wesley Bright is a wonderful 69 y.o. male who has been previously seen by my colleague Dr Grace Isaac for evaluation and management of Chronic Lymphocytic Leukemia. He is accompanied today by his daughter and wife. The pt reports that he is doing well overall.   The pt reports that he is feeling better and his energy levels have been improving. He has continued to take Vitamin D, Vitamin B12 and co-enzyme Q. He notes no problems taking his Ibrutinib besides mild rashes that come and go without itching or bothering him very much.   He notes that he has had problems in the past with fluid in the abdomen, and has taken medication for this. He has also intermittently had constipation.   Most recent lab results (11/03/17) of CBC  is as follows: all values are WNL except for WBC at 30.3k, HGB at 12.3, HCT at 37.9, RDW at 15.0, PLT at 109k, Lymphs abs at 26.8k, Creatinine at 1.62, Total Protein at 6.1, Total bilirubin at <0.2, GFR at 43. LDH 6/19;19 is WNL at 164 Uric Acid 11/03/17 is WNL at 4.3  On review of systems, pt reports mild skin rashes, increasing energy levels, and denies mouth sores, fevers, chills, diarrhea, inguinal rashes, itching, vision changes, abdominal pains, leg swelling, and any other symptoms.   Interval History:  Wesley Bright returns  today for management and evaluation of his CLL. We are joined today by his wife. The patient's last visit with [place] was on 01/01/2020. The pt reports that he is doing well overall.  The pt reports that he has lost four pounds in the last four months. Pt notes that he is busier at work and may not have time to eat as well as before. He is concerned about his rising blood pressure, but is feeling well otherwise.   Lab results today (01/29/20) of CBC w/diff and CMP is as follows: all values are WNL except for WBC at 15.0K, PLT at 103K, Lymphs Abs at 10.0K, WBC Morphology shows "Variant Lymphs Observed", Creatinine at 1.62, AST at 14, GFR Est Non Af Am at 43. 01/29/2020 Hepatitis B surface antigen is in progress 01/29/2020 Hepatitis B Core antibody is in progress   On review of systems, pt reports unexpected weight loss and denies fevers, chills, night sweats, abdominal pain and any other symptoms.   MEDICAL HISTORY:  Past Medical History:  Diagnosis Date  . External hemorrhoid     SURGICAL HISTORY: Past Surgical History:  Procedure Laterality Date  . APPENDECTOMY    . INGUINAL HERNIA REPAIR     left  . KNEE SURGERY     Left Knee    SOCIAL HISTORY: Social History   Socioeconomic History  . Marital status: Married    Spouse name: Not on file  . Number of children: 2  . Years of education: Not on file  . Highest education level:  Not on file  Occupational History  . Occupation: Buyer, retail: [employer]  Tobacco Use  . Smoking status: Current Every Day Smoker    Packs/day: 0.50  . Smokeless tobacco: Never Used  Vaping Use  . Vaping Use: Never used  Substance and Sexual Activity  . Alcohol use: Yes    Comment: occasional  . Drug use: No  . Sexual activity: Not on file  Other Topics Concern  . Not on file  Social History Narrative  . Not on file   Social Determinants of Health   Financial Resource Strain:   . Difficulty of Paying Living Expenses: Not  on file  Food Insecurity:   . Worried About Charity fundraiser in the Last Year: Not on file  . Ran Out of Food in the Last Year: Not on file  Transportation Needs:   . Lack of Transportation (Medical): Not on file  . Lack of Transportation (Non-Medical): Not on file  Physical Activity:   . Days of Exercise per Week: Not on file  . Minutes of Exercise per Session: Not on file  Stress:   . Feeling of Stress : Not on file  Social Connections:   . Frequency of Communication with Friends and Family: Not on file  . Frequency of Social Gatherings with Friends and Family: Not on file  . Attends Religious Services: Not on file  . Active Member of Clubs or Organizations: Not on file  . Attends Archivist Meetings: Not on file  . Marital Status: Not on file  Intimate Partner Violence:   . Fear of Current or Ex-Partner: Not on file  . Emotionally Abused: Not on file  . Physically Abused: Not on file  . Sexually Abused: Not on file    FAMILY HISTORY: Family History  Problem Relation Age of Onset  . Liver cancer Brother   . Cancer Brother        Liver  . Esophageal cancer Father   . Cancer Father        Throat   . Cancer Mother        Bladder - cause of death    ALLERGIES:  has No Known Allergies.  MEDICATIONS:  Current Outpatient Medications  Medication Sig Dispense Refill  . amLODipine (NORVASC) 5 MG tablet Take 5 mg by mouth daily.    . cholecalciferol (VITAMIN D) 1000 units tablet Take 1,000 Units by mouth daily.    Marland Kitchen co-enzyme Q-10 30 MG capsule Take 30 mg by mouth 3 (three) times daily.    . Ibrutinib (IMBRUVICA) 420 MG TABS Take 420 mg by mouth daily. Take with a glass of water at approximately the same time each day. 28 tablet 11  . tamsulosin (FLOMAX) 0.4 MG CAPS capsule Take 0.4 mg by mouth daily.    . vitamin B-12 (CYANOCOBALAMIN) 100 MCG tablet Take 100 mcg by mouth daily.     No current facility-administered medications for this visit.    REVIEW OF  SYSTEMS:   A 10+ POINT REVIEW OF SYSTEMS WAS OBTAINED including neurology, dermatology, psychiatry, cardiac, respiratory, lymph, extremities, GI, GU, Musculoskeletal, constitutional, breasts, reproductive, HEENT.  All pertinent positives are noted in the HPI.  All others are negative.   PHYSICAL EXAMINATION: ECOG PERFORMANCE STATUS: 1 - Symptomatic but completely ambulatory  Vitals:   01/29/20 0911  BP: (!) 164/84  Pulse: 64  Resp: 18  Temp: (!) 96.7 F (35.9 C)  SpO2: 100%   Filed  Weights   01/29/20 0911  Weight: 189 lb 6.4 oz (85.9 kg)   .Body mass index is 25.69 kg/m.   GENERAL:alert, in no acute distress and comfortable SKIN: no acute rashes, no significant lesions EYES: conjunctiva are pink and non-injected, sclera anicteric OROPHARYNX: MMM, no exudates, no oropharyngeal erythema or ulceration NECK: supple, no JVD LYMPH:  no palpable lymphadenopathy in the cervical, axillary or inguinal regions LUNGS: clear to auscultation b/l with normal respiratory effort HEART: regular rate & rhythm ABDOMEN:  normoactive bowel sounds , non tender, not distended. No palpable hepatosplenomegaly.  Extremity: no pedal edema PSYCH: alert & oriented x 3 with fluent speech NEURO: no focal motor/sensory deficits  LABORATORY DATA:  I have reviewed the data as listed  . CBC Latest Ref Rng & Units 01/29/2020 01/01/2020 10/02/2019  WBC 4.0 - 10.5 K/uL 15.0(H) 15.2(H) 16.9(H)  Hemoglobin 13.0 - 17.0 g/dL 15.2 14.8 15.3  Hematocrit 39 - 52 % 46.7 45.7 47.0  Platelets 150 - 400 K/uL 103(L) 115(L) 116(L)    . CMP Latest Ref Rng & Units 01/29/2020 01/01/2020 10/02/2019  Glucose 70 - 99 mg/dL 82 91 80  BUN 8 - 23 mg/dL 15 18 17   Creatinine 0.61 - 1.24 mg/dL 1.62(H) 1.71(H) 1.96(H)  Sodium 135 - 145 mmol/L 140 139 141  Potassium 3.5 - 5.1 mmol/L 3.7 4.2 4.2  Chloride 98 - 111 mmol/L 109 110 110  CO2 22 - 32 mmol/L 24 22 22   Calcium 8.9 - 10.3 mg/dL 8.9 9.0 8.7(L)  Total Protein 6.5 - 8.1 g/dL  6.8 6.6 6.8  Total Bilirubin 0.3 - 1.2 mg/dL 0.6 0.4 0.5  Alkaline Phos 38 - 126 U/L 105 94 95  AST 15 - 41 U/L 14(L) 12(L) 14(L)  ALT 0 - 44 U/L 16 15 26    06/20/2019 Kidney Biopsy:    12/31/16 Flow Cytometry:    12/15/16 CLL FISH:    RADIOGRAPHIC STUDIES: I have personally reviewed the radiological images as listed and agreed with the findings in the report. No results found.  ASSESSMENT & PLAN:  69 y.o. male with  1. Chronic Lymphocytic Leukemia - monoalleilic 62E deletion. Was started on Ibrutinib from 01/2017 due to fatigue, weight loss and significant splenomegaly.  2. H/o Grade 1 intermittent rash ?related to Ibrutinib - no currently an issue.  PLAN:   -Discussed pt labwork today, 01/29/20; WBC continue to trend down, PLT remains low, Hgb is nml, blood chemistries are stable -Discussed 01/24/2020 Hep B testing is in progress -No lab or clinical evidence of CLL progression at this time. -The pt has no prohibitive toxicities from beginning weekly Rituxan at this time. -Advised pt to continue Ibrutinib for the duration of his treatment with Rituxan.  -Advised pt that his weight loss is not concerning, especially since it is not a large amount of weight loss and he is not experiencing any other constitutional symptoms. Most likely caused by change in diet.  -Recommend pt limit dietary sodium and check his blood pressure in the morning, prior to food or activity.  -Will see back in 1 week with C2 for a toxicity check   3.  Patient Active Problem List   Diagnosis Date Noted  . Counseling regarding advance care planning and goals of care 01/08/2020  . Skin rash 10/03/2017  . CLL (chronic lymphocytic leukemia) ([place]) 12/09/2016  . General weakness 12/07/2016  . Smoker 12/07/2016  . Loss of weight 12/07/2016  . GERD (gastroesophageal reflux disease) 12/30/2010  . Constipation 12/30/2010  .  Hemorrhoids, external 12/30/2010    FOLLOW UP: F/u for Rituxan treatment, labs  and MD f/u as scheduled for the next 4-5 weeks   The total time spent in the appt was 20 minutes and more than 50% was on counseling and direct patient cares.  All of the patient's questions were answered with apparent satisfaction. The patient knows to call the clinic with any problems, questions or concerns.   Sullivan Lone MD [place] AAHIVMS [facility] Hematology/Oncology Physician [facility]  (Office):       [phone] (Work cell):  [phone] (Fax):           [phone]  01/29/2020 10:18 AM  I, Yevette Edwards, am acting as a scribe for Dr. Sullivan Lone.   .I have reviewed the above documentation for accuracy and completeness, and I agree with the above. Brunetta Genera MD

## 2020-01-30 ENCOUNTER — Inpatient Hospital Stay: Payer: PPO

## 2020-01-30 ENCOUNTER — Other Ambulatory Visit: Payer: Self-pay

## 2020-01-30 VITALS — BP 126/66 | HR 68 | Temp 98.0°F | Resp 18

## 2020-01-30 DIAGNOSIS — C9110 Chronic lymphocytic leukemia of B-cell type not having achieved remission: Secondary | ICD-10-CM

## 2020-01-30 DIAGNOSIS — Z7189 Other specified counseling: Secondary | ICD-10-CM

## 2020-01-30 DIAGNOSIS — Z5112 Encounter for antineoplastic immunotherapy: Secondary | ICD-10-CM | POA: Diagnosis not present

## 2020-01-30 MED ORDER — FAMOTIDINE IN NACL 20-0.9 MG/50ML-% IV SOLN
INTRAVENOUS | Status: AC
  Filled 2020-01-30: qty 50

## 2020-01-30 MED ORDER — SODIUM CHLORIDE 0.9 % IV SOLN
375.0000 mg/m2 | Freq: Once | INTRAVENOUS | Status: AC
  Administered 2020-01-30: 800 mg via INTRAVENOUS
  Filled 2020-01-30: qty 30

## 2020-01-30 MED ORDER — FAMOTIDINE IN NACL 20-0.9 MG/50ML-% IV SOLN
20.0000 mg | Freq: Once | INTRAVENOUS | Status: AC
  Administered 2020-01-30: 20 mg via INTRAVENOUS

## 2020-01-30 MED ORDER — ACETAMINOPHEN 325 MG PO TABS
ORAL_TABLET | ORAL | Status: AC
  Filled 2020-01-30: qty 2

## 2020-01-30 MED ORDER — SODIUM CHLORIDE 0.9 % IV SOLN
Freq: Once | INTRAVENOUS | Status: AC
  Filled 2020-01-30: qty 250

## 2020-01-30 MED ORDER — DIPHENHYDRAMINE HCL 25 MG PO CAPS
50.0000 mg | ORAL_CAPSULE | Freq: Once | ORAL | Status: AC
  Administered 2020-01-30: 50 mg via ORAL

## 2020-01-30 MED ORDER — METHYLPREDNISOLONE SODIUM SUCC 125 MG IJ SOLR
INTRAMUSCULAR | Status: AC
  Filled 2020-01-30: qty 2

## 2020-01-30 MED ORDER — DIPHENHYDRAMINE HCL 25 MG PO CAPS
ORAL_CAPSULE | ORAL | Status: AC
  Filled 2020-01-30: qty 2

## 2020-01-30 MED ORDER — ACETAMINOPHEN 325 MG PO TABS
650.0000 mg | ORAL_TABLET | Freq: Once | ORAL | Status: AC
  Administered 2020-01-30: 650 mg via ORAL

## 2020-01-30 MED ORDER — METHYLPREDNISOLONE SODIUM SUCC 125 MG IJ SOLR
125.0000 mg | Freq: Every day | INTRAMUSCULAR | Status: DC
  Administered 2020-01-30: 125 mg via INTRAVENOUS

## 2020-01-30 NOTE — Patient Instructions (Signed)
Rituximab injection What is this medicine? RITUXIMAB (ri TUX i mab) is a monoclonal antibody. It is used to treat certain types of cancer like non-Hodgkin lymphoma and chronic lymphocytic leukemia. It is also used to treat rheumatoid arthritis, granulomatosis with polyangiitis (or Wegener's granulomatosis), microscopic polyangiitis, and pemphigus vulgaris. This medicine may be used for other purposes; ask your health care provider or pharmacist if you have questions. COMMON BRAND NAME(S): Rituxan, RUXIENCE What should I tell my health care provider before I take this medicine? They need to know if you have any of these conditions:  heart disease  infection (especially a virus infection such as hepatitis B, chickenpox, cold sores, or herpes)  immune system problems  irregular heartbeat  kidney disease  low blood counts, like low white cell, platelet, or red cell counts  lung or breathing disease, like asthma  recently received or scheduled to receive a vaccine  an unusual or allergic reaction to rituximab, other medicines, foods, dyes, or preservatives  pregnant or trying to get pregnant  breast-feeding How should I use this medicine? This medicine is for infusion into a vein. It is administered in a hospital or clinic by a specially trained health care professional. A special MedGuide will be given to you by the pharmacist with each prescription and refill. Be sure to read this information carefully each time. Talk to your pediatrician regarding the use of this medicine in children. This medicine is not approved for use in children. Overdosage: If you think you have taken too much of this medicine contact a poison control center or emergency room at once. NOTE: This medicine is only for you. Do not share this medicine with others. What if I miss a dose? It is important not to miss a dose. Call your doctor or health care professional if you are unable to keep an appointment. What  may interact with this medicine?  cisplatin  live virus vaccines This list may not describe all possible interactions. Give your health care provider a list of all the medicines, herbs, non-prescription drugs, or dietary supplements you use. Also tell them if you smoke, drink alcohol, or use illegal drugs. Some items may interact with your medicine. What should I watch for while using this medicine? Your condition will be monitored carefully while you are receiving this medicine. You may need blood work done while you are taking this medicine. This medicine can cause serious allergic reactions. To reduce your risk you may need to take medicine before treatment with this medicine. Take your medicine as directed. In some patients, this medicine may cause a serious brain infection that may cause death. If you have any problems seeing, thinking, speaking, walking, or standing, tell your healthcare professional right away. If you cannot reach your healthcare professional, urgently seek other source of medical care. Call your doctor or health care professional for advice if you get a fever, chills or sore throat, or other symptoms of a cold or flu. Do not treat yourself. This drug decreases your body's ability to fight infections. Try to avoid being around people who are sick. Do not become pregnant while taking this medicine or for at least 12 months after stopping it. Women should inform their doctor if they wish to become pregnant or think they might be pregnant. There is a potential for serious side effects to an unborn child. Talk to your health care professional or pharmacist for more information. Do not breast-feed an infant while taking this medicine or for at   least 6 months after stopping it. What side effects may I notice from receiving this medicine? Side effects that you should report to your doctor or health care professional as soon as possible:  allergic reactions like skin rash, itching or  hives; swelling of the face, lips, or tongue  breathing problems  chest pain  changes in vision  diarrhea  headache with fever, neck stiffness, sensitivity to light, nausea, or confusion  fast, irregular heartbeat  loss of memory  low blood counts - this medicine may decrease the number of white blood cells, red blood cells and platelets. You may be at increased risk for infections and bleeding.  mouth sores  problems with balance, talking, or walking  redness, blistering, peeling or loosening of the skin, including inside the mouth  signs of infection - fever or chills, cough, sore throat, pain or difficulty passing urine  signs and symptoms of kidney injury like trouble passing urine or change in the amount of urine  signs and symptoms of liver injury like dark yellow or brown urine; general ill feeling or flu-like symptoms; light-colored stools; loss of appetite; nausea; right upper belly pain; unusually weak or tired; yellowing of the eyes or skin  signs and symptoms of low blood pressure like dizziness; feeling faint or lightheaded, falls; unusually weak or tired  stomach pain  swelling of the ankles, feet, hands  unusual bleeding or bruising  vomiting Side effects that usually do not require medical attention (report to your doctor or health care professional if they continue or are bothersome):  headache  joint pain  muscle cramps or muscle pain  nausea  tiredness This list may not describe all possible side effects. Call your doctor for medical advice about side effects. You may report side effects to FDA at 1-800-FDA-1088. Where should I keep my medicine? This drug is given in a hospital or clinic and will not be stored at home. NOTE: This sheet is a summary. It may not cover all possible information. If you have questions about this medicine, talk to your doctor, pharmacist, or health care provider.  2020 Elsevier/Gold Standard (2018-06-15  22:01:36)  

## 2020-01-31 ENCOUNTER — Telehealth: Payer: Self-pay | Admitting: *Deleted

## 2020-02-05 ENCOUNTER — Encounter: Payer: Self-pay | Admitting: Hematology

## 2020-02-05 ENCOUNTER — Inpatient Hospital Stay: Payer: PPO

## 2020-02-05 ENCOUNTER — Other Ambulatory Visit: Payer: Self-pay

## 2020-02-05 ENCOUNTER — Inpatient Hospital Stay (HOSPITAL_BASED_OUTPATIENT_CLINIC_OR_DEPARTMENT_OTHER): Payer: PPO | Admitting: Hematology

## 2020-02-05 VITALS — BP 131/81 | HR 60 | Temp 98.3°F | Resp 18

## 2020-02-05 VITALS — BP 164/84 | HR 75 | Temp 97.4°F | Resp 18 | Ht 72.0 in | Wt 190.6 lb

## 2020-02-05 DIAGNOSIS — D696 Thrombocytopenia, unspecified: Secondary | ICD-10-CM | POA: Diagnosis not present

## 2020-02-05 DIAGNOSIS — Z7189 Other specified counseling: Secondary | ICD-10-CM

## 2020-02-05 DIAGNOSIS — Z5112 Encounter for antineoplastic immunotherapy: Secondary | ICD-10-CM

## 2020-02-05 DIAGNOSIS — C9110 Chronic lymphocytic leukemia of B-cell type not having achieved remission: Secondary | ICD-10-CM

## 2020-02-05 LAB — CMP (CANCER CENTER ONLY)
ALT: 32 U/L (ref 0–44)
AST: 21 U/L (ref 15–41)
Albumin: 3.6 g/dL (ref 3.5–5.0)
Alkaline Phosphatase: 107 U/L (ref 38–126)
Anion gap: 7 (ref 5–15)
BUN: 22 mg/dL (ref 8–23)
CO2: 20 mmol/L — ABNORMAL LOW (ref 22–32)
Calcium: 8.6 mg/dL — ABNORMAL LOW (ref 8.9–10.3)
Chloride: 113 mmol/L — ABNORMAL HIGH (ref 98–111)
Creatinine: 1.6 mg/dL — ABNORMAL HIGH (ref 0.61–1.24)
GFR, Est AFR Am: 51 mL/min — ABNORMAL LOW (ref >60)
GFR, Estimated: 44 mL/min — ABNORMAL LOW (ref >60)
Glucose, Bld: 91 mg/dL (ref 70–99)
Potassium: 3.8 mmol/L (ref 3.5–5.1)
Sodium: 140 mmol/L (ref 135–145)
Total Bilirubin: 0.4 mg/dL (ref 0.3–1.2)
Total Protein: 6.7 g/dL (ref 6.5–8.1)

## 2020-02-05 LAB — CBC WITH DIFFERENTIAL/PLATELET
Abs Immature Granulocytes: 0.01 10*3/uL (ref 0.00–0.07)
Basophils Absolute: 0.1 10*3/uL (ref 0.0–0.1)
Basophils Relative: 1 %
Eosinophils Absolute: 0.1 10*3/uL (ref 0.0–0.5)
Eosinophils Relative: 2 %
HCT: 46.9 % (ref 39.0–52.0)
Hemoglobin: 15.3 g/dL (ref 13.0–17.0)
Immature Granulocytes: 0 %
Lymphocytes Relative: 22 %
Lymphs Abs: 1.5 10*3/uL (ref 0.7–4.0)
MCH: 28.8 pg (ref 26.0–34.0)
MCHC: 32.6 g/dL (ref 30.0–36.0)
MCV: 88.2 fL (ref 80.0–100.0)
Monocytes Absolute: 0.6 10*3/uL (ref 0.1–1.0)
Monocytes Relative: 9 %
Neutro Abs: 4.5 10*3/uL (ref 1.7–7.7)
Neutrophils Relative %: 66 %
Platelets: 113 10*3/uL — ABNORMAL LOW (ref 150–400)
RBC: 5.32 MIL/uL (ref 4.22–5.81)
RDW: 13.9 % (ref 11.5–15.5)
WBC: 6.9 10*3/uL (ref 4.0–10.5)
nRBC: 0 % (ref 0.0–0.2)

## 2020-02-05 MED ORDER — ACETAMINOPHEN 325 MG PO TABS
ORAL_TABLET | ORAL | Status: AC
  Filled 2020-02-05: qty 2

## 2020-02-05 MED ORDER — ACETAMINOPHEN 325 MG PO TABS
650.0000 mg | ORAL_TABLET | Freq: Once | ORAL | Status: AC
  Administered 2020-02-05: 650 mg via ORAL

## 2020-02-05 MED ORDER — FAMOTIDINE IN NACL 20-0.9 MG/50ML-% IV SOLN
INTRAVENOUS | Status: AC
  Filled 2020-02-05: qty 50

## 2020-02-05 MED ORDER — SODIUM CHLORIDE 0.9 % IV SOLN
Freq: Once | INTRAVENOUS | Status: AC
  Filled 2020-02-05: qty 250

## 2020-02-05 MED ORDER — METHYLPREDNISOLONE SODIUM SUCC 125 MG IJ SOLR
INTRAMUSCULAR | Status: AC
  Filled 2020-02-05: qty 2

## 2020-02-05 MED ORDER — DIPHENHYDRAMINE HCL 25 MG PO CAPS
ORAL_CAPSULE | ORAL | Status: AC
  Filled 2020-02-05: qty 2

## 2020-02-05 MED ORDER — FAMOTIDINE IN NACL 20-0.9 MG/50ML-% IV SOLN
20.0000 mg | Freq: Once | INTRAVENOUS | Status: AC
  Administered 2020-02-05: 20 mg via INTRAVENOUS

## 2020-02-05 MED ORDER — DIPHENHYDRAMINE HCL 25 MG PO CAPS
50.0000 mg | ORAL_CAPSULE | Freq: Once | ORAL | Status: AC
  Administered 2020-02-05: 50 mg via ORAL

## 2020-02-05 MED ORDER — METHYLPREDNISOLONE SODIUM SUCC 125 MG IJ SOLR
125.0000 mg | Freq: Every day | INTRAMUSCULAR | Status: DC
  Administered 2020-02-05: 125 mg via INTRAVENOUS

## 2020-02-05 MED ORDER — SODIUM CHLORIDE 0.9 % IV SOLN: 375.0000 mg/m2 | Freq: Once | INTRAVENOUS | Status: DC

## 2020-02-05 MED ORDER — SODIUM CHLORIDE 0.9 % IV SOLN
375.0000 mg/m2 | Freq: Once | INTRAVENOUS | Status: AC
  Administered 2020-02-05: 800 mg via INTRAVENOUS
  Filled 2020-02-05: qty 30

## 2020-02-05 NOTE — Progress Notes (Signed)
HEMATOLOGY/ONCOLOGY CLINIC NOTE  Date of Service: 02/05/2020  Patient Care Team: Patient, No Pcp Per as PCP - General (General Practice)  CHIEF COMPLAINTS/PURPOSE OF CONSULTATION:  F/u for Chronic Lymphocytic Leukemia   Oncologic History:  [name] with CLL after a CLL FISH study revealed heterozygous 13q deletion within 92% cells. Subsequent imaging showed diffuse non-bulky lymphadenopathy in chest, abdomen, and pelvis with splenomegaly of 1200 cm^3. The pt began daily Ibrutinib 420mg  on 01/27/17 after becoming increasing fatigued and lost 15 pounds of weight over 4-5 months.   HISTORY OF PRESENTING ILLNESS:   Wesley Bright is a wonderful 69 y.o. male who has been previously seen by my colleague Dr Grace Isaac for evaluation and management of Chronic Lymphocytic Leukemia. He is accompanied today by his daughter and wife. The pt reports that he is doing well overall.   The pt reports that he is feeling better and his energy levels have been improving. He has continued to take Vitamin D, Vitamin B12 and co-enzyme Q. He notes no problems taking his Ibrutinib besides mild rashes that come and go without itching or bothering him very much.   He notes that he has had problems in the past with fluid in the abdomen, and has taken medication for this. He has also intermittently had constipation.   Most recent lab results (11/03/17) of CBC  is as follows: all values are WNL except for WBC at 30.3k, HGB at 12.3, HCT at 37.9, RDW at 15.0, PLT at 109k, Lymphs abs at 26.8k, Creatinine at 1.62, Total Protein at 6.1, Total bilirubin at <0.2, GFR at 43. LDH 6/19;19 is WNL at 164 Uric Acid 11/03/17 is WNL at 4.3  On review of systems, pt reports mild skin rashes, increasing energy levels, and denies mouth sores, fevers, chills, diarrhea, inguinal rashes, itching, vision changes, abdominal pains, leg swelling, and any other symptoms.   Interval History:  Wesley Bright returns  today for management and evaluation of his CLL. He is here for a toxicity check after C1 Rituxan. The patient's last visit with [place] was on 01/29/2020. The pt reports that he is doing well overall.  The pt reports that he has felt an increase in energy. He denies any new symptoms. Pt has received his COVID19 vaccines and booster.   Lab results today (02/05/20) of CBC w/diff and CMP is as follows: all values are WNL except for PLT at 113K, Chloride at 113, CO2 at 20, Creatinine at 1.60, Calcium at 8.6, GFR Est Non Af Am at 44.  On review of systems, pt denies fevers, chills, night sweats, unexpected weight loss, fatigue, back pain, abdominal pain and any other symptoms.    MEDICAL HISTORY:  Past Medical History:  Diagnosis Date  . External hemorrhoid     SURGICAL HISTORY: Past Surgical History:  Procedure Laterality Date  . APPENDECTOMY    . INGUINAL HERNIA REPAIR     left  . KNEE SURGERY     Left Knee    SOCIAL HISTORY: Social History   Socioeconomic History  . Marital status: Married    Spouse name: Not on file  . Number of children: 2  . Years of education: Not on file  . Highest education level: Not on file  Occupational History  . Occupation: Buyer, retail: [employer]  Tobacco Use  . Smoking status: Current Every Day Smoker    Packs/day: 0.50  . Smokeless tobacco: Never Used  Vaping  Use  . Vaping Use: Never used  Substance and Sexual Activity  . Alcohol use: Yes    Comment: occasional  . Drug use: No  . Sexual activity: Not on file  Other Topics Concern  . Not on file  Social History Narrative  . Not on file   Social Determinants of Health   Financial Resource Strain:   . Difficulty of Paying Living Expenses: Not on file  Food Insecurity:   . Worried About Charity fundraiser in the Last Year: Not on file  . Ran Out of Food in the Last Year: Not on file  Transportation Needs:   . Lack of Transportation (Medical): Not on file  . Lack  of Transportation (Non-Medical): Not on file  Physical Activity:   . Days of Exercise per Week: Not on file  . Minutes of Exercise per Session: Not on file  Stress:   . Feeling of Stress : Not on file  Social Connections:   . Frequency of Communication with Friends and Family: Not on file  . Frequency of Social Gatherings with Friends and Family: Not on file  . Attends Religious Services: Not on file  . Active Member of Clubs or Organizations: Not on file  . Attends Archivist Meetings: Not on file  . Marital Status: Not on file  Intimate Partner Violence:   . Fear of Current or Ex-Partner: Not on file  . Emotionally Abused: Not on file  . Physically Abused: Not on file  . Sexually Abused: Not on file    FAMILY HISTORY: Family History  Problem Relation Age of Onset  . Liver cancer Brother   . Cancer Brother        Liver  . Esophageal cancer Father   . Cancer Father        Throat   . Cancer Mother        Bladder - cause of death    ALLERGIES:  has No Known Allergies.  MEDICATIONS:  Current Outpatient Medications  Medication Sig Dispense Refill  . amLODipine (NORVASC) 5 MG tablet Take 5 mg by mouth daily.    . cholecalciferol (VITAMIN D) 1000 units tablet Take 1,000 Units by mouth daily.    Marland Kitchen co-enzyme Q-10 30 MG capsule Take 30 mg by mouth 3 (three) times daily.    . Ibrutinib (IMBRUVICA) 420 MG TABS Take 420 mg by mouth daily. Take with a glass of water at approximately the same time each day. 28 tablet 11  . tamsulosin (FLOMAX) 0.4 MG CAPS capsule Take 0.4 mg by mouth daily.    . vitamin B-12 (CYANOCOBALAMIN) 100 MCG tablet Take 100 mcg by mouth daily.     No current facility-administered medications for this visit.    REVIEW OF SYSTEMS:   A 10+ POINT REVIEW OF SYSTEMS WAS OBTAINED including neurology, dermatology, psychiatry, cardiac, respiratory, lymph, extremities, GI, GU, Musculoskeletal, constitutional, breasts, reproductive, HEENT.  All pertinent  positives are noted in the HPI.  All others are negative.   PHYSICAL EXAMINATION: ECOG PERFORMANCE STATUS: 1 - Symptomatic but completely ambulatory  Vitals:   02/05/20 1005  BP: (!) 164/84  Pulse: 75  Resp: 18  Temp: (!) 97.4 F (36.3 C)  SpO2: 100%   Filed Weights   02/05/20 1005  Weight: 190 lb 9.6 oz (86.5 kg)   .Body mass index is 25.85 kg/m.   GENERAL:alert, in no acute distress and comfortable SKIN: no acute rashes, no significant lesions EYES: conjunctiva are pink  and non-injected, sclera anicteric OROPHARYNX: MMM, no exudates, no oropharyngeal erythema or ulceration NECK: supple, no JVD LYMPH:  no palpable lymphadenopathy in the cervical, axillary or inguinal regions LUNGS: clear to auscultation b/l with normal respiratory effort HEART: regular rate & rhythm ABDOMEN:  normoactive bowel sounds , non tender, not distended. No palpable hepatosplenomegaly.  Extremity: no pedal edema PSYCH: alert & oriented x 3 with fluent speech NEURO: no focal motor/sensory deficits  LABORATORY DATA:  I have reviewed the data as listed  . CBC Latest Ref Rng & Units 02/05/2020 01/29/2020 01/01/2020  WBC 4.0 - 10.5 K/uL 6.9 15.0(H) 15.2(H)  Hemoglobin 13.0 - 17.0 g/dL 15.3 15.2 14.8  Hematocrit 39 - 52 % 46.9 46.7 45.7  Platelets 150 - 400 K/uL 113(L) 103(L) 115(L)    . CMP Latest Ref Rng & Units 02/05/2020 01/29/2020 01/01/2020  Glucose 70 - 99 mg/dL 91 82 91  BUN 8 - 23 mg/dL 22 15 18   Creatinine 0.61 - 1.24 mg/dL 1.60(H) 1.62(H) 1.71(H)  Sodium 135 - 145 mmol/L 140 140 139  Potassium 3.5 - 5.1 mmol/L 3.8 3.7 4.2  Chloride 98 - 111 mmol/L 113(H) 109 110  CO2 22 - 32 mmol/L 20(L) 24 22  Calcium 8.9 - 10.3 mg/dL 8.6(L) 8.9 9.0  Total Protein 6.5 - 8.1 g/dL 6.7 6.8 6.6  Total Bilirubin 0.3 - 1.2 mg/dL 0.4 0.6 0.4  Alkaline Phos 38 - 126 U/L 107 105 94  AST 15 - 41 U/L 21 14(L) 12(L)  ALT 0 - 44 U/L 32 16 15   06/20/2019 Kidney Biopsy:    12/31/16 Flow  Cytometry:    12/15/16 CLL FISH:    RADIOGRAPHIC STUDIES: I have personally reviewed the radiological images as listed and agreed with the findings in the report. No results found.  ASSESSMENT & PLAN:  69 y.o. male with  1. Chronic Lymphocytic Leukemia - monoalleilic 44H deletion. Was started on Ibrutinib from 01/2017 due to fatigue, weight loss and significant splenomegaly.  2. H/o Grade 1 intermittent rash ?related to Ibrutinib - no currently an issue.  PLAN:   -Discussed pt labwork today, 02/05/20; lymphocytosis has resolved, other blood counts and chemistries are stable.  -The pt has no prohibitive toxicities from continuing C2 Rituxan at this time. Will continue weekly.  -No lab or clinical evidence of CLL progression at this time. -Recommend pt complete one week of Ibrutinib before discontinuing.  -Will see back with C4   3.  Patient Active Problem List   Diagnosis Date Noted  . Counseling regarding advance care planning and goals of care 01/08/2020  . Skin rash 10/03/2017  . CLL (chronic lymphocytic leukemia) ([place]) 12/09/2016  . General weakness 12/07/2016  . Smoker 12/07/2016  . Loss of weight 12/07/2016  . GERD (gastroesophageal reflux disease) 12/30/2010  . Constipation 12/30/2010  . Hemorrhoids, external 12/30/2010    FOLLOW UP: F/u for C3 and C4 of weekly Rituxan as scheduled   The total time spent in the appt was 20 minutes and more than 50% was on counseling and direct patient cares.  All of the patient's questions were answered with apparent satisfaction. The patient knows to call the clinic with any problems, questions or concerns.   Sullivan Lone MD [place] AAHIVMS [facility] Hematology/Oncology Physician [facility]  (Office):       [phone] (Work cell):  [phone] (Fax):           [phone]  02/05/2020 10:50 AM  I, Yevette Edwards, am acting  as a scribe for Dr. Sullivan Lone.   .I have reviewed the above documentation for  accuracy and completeness, and I agree with the above. Brunetta Genera MD

## 2020-02-05 NOTE — Patient Instructions (Signed)
Rituximab injection What is this medicine? RITUXIMAB (ri TUX i mab) is a monoclonal antibody. It is used to treat certain types of cancer like non-Hodgkin lymphoma and chronic lymphocytic leukemia. It is also used to treat rheumatoid arthritis, granulomatosis with polyangiitis (or Wegener's granulomatosis), microscopic polyangiitis, and pemphigus vulgaris. This medicine may be used for other purposes; ask your health care provider or pharmacist if you have questions. COMMON BRAND NAME(S): Rituxan, RUXIENCE What should I tell my health care provider before I take this medicine? They need to know if you have any of these conditions:  heart disease  infection (especially a virus infection such as hepatitis B, chickenpox, cold sores, or herpes)  immune system problems  irregular heartbeat  kidney disease  low blood counts, like low white cell, platelet, or red cell counts  lung or breathing disease, like asthma  recently received or scheduled to receive a vaccine  an unusual or allergic reaction to rituximab, other medicines, foods, dyes, or preservatives  pregnant or trying to get pregnant  breast-feeding How should I use this medicine? This medicine is for infusion into a vein. It is administered in a hospital or clinic by a specially trained health care professional. A special MedGuide will be given to you by the pharmacist with each prescription and refill. Be sure to read this information carefully each time. Talk to your pediatrician regarding the use of this medicine in children. This medicine is not approved for use in children. Overdosage: If you think you have taken too much of this medicine contact a poison control center or emergency room at once. NOTE: This medicine is only for you. Do not share this medicine with others. What if I miss a dose? It is important not to miss a dose. Call your doctor or health care professional if you are unable to keep an appointment. What  may interact with this medicine?  cisplatin  live virus vaccines This list may not describe all possible interactions. Give your health care provider a list of all the medicines, herbs, non-prescription drugs, or dietary supplements you use. Also tell them if you smoke, drink alcohol, or use illegal drugs. Some items may interact with your medicine. What should I watch for while using this medicine? Your condition will be monitored carefully while you are receiving this medicine. You may need blood work done while you are taking this medicine. This medicine can cause serious allergic reactions. To reduce your risk you may need to take medicine before treatment with this medicine. Take your medicine as directed. In some patients, this medicine may cause a serious brain infection that may cause death. If you have any problems seeing, thinking, speaking, walking, or standing, tell your healthcare professional right away. If you cannot reach your healthcare professional, urgently seek other source of medical care. Call your doctor or health care professional for advice if you get a fever, chills or sore throat, or other symptoms of a cold or flu. Do not treat yourself. This drug decreases your body's ability to fight infections. Try to avoid being around people who are sick. Do not become pregnant while taking this medicine or for at least 12 months after stopping it. Women should inform their doctor if they wish to become pregnant or think they might be pregnant. There is a potential for serious side effects to an unborn child. Talk to your health care professional or pharmacist for more information. Do not breast-feed an infant while taking this medicine or for at   least 6 months after stopping it. What side effects may I notice from receiving this medicine? Side effects that you should report to your doctor or health care professional as soon as possible:  allergic reactions like skin rash, itching or  hives; swelling of the face, lips, or tongue  breathing problems  chest pain  changes in vision  diarrhea  headache with fever, neck stiffness, sensitivity to light, nausea, or confusion  fast, irregular heartbeat  loss of memory  low blood counts - this medicine may decrease the number of white blood cells, red blood cells and platelets. You may be at increased risk for infections and bleeding.  mouth sores  problems with balance, talking, or walking  redness, blistering, peeling or loosening of the skin, including inside the mouth  signs of infection - fever or chills, cough, sore throat, pain or difficulty passing urine  signs and symptoms of kidney injury like trouble passing urine or change in the amount of urine  signs and symptoms of liver injury like dark yellow or brown urine; general ill feeling or flu-like symptoms; light-colored stools; loss of appetite; nausea; right upper belly pain; unusually weak or tired; yellowing of the eyes or skin  signs and symptoms of low blood pressure like dizziness; feeling faint or lightheaded, falls; unusually weak or tired  stomach pain  swelling of the ankles, feet, hands  unusual bleeding or bruising  vomiting Side effects that usually do not require medical attention (report to your doctor or health care professional if they continue or are bothersome):  headache  joint pain  muscle cramps or muscle pain  nausea  tiredness This list may not describe all possible side effects. Call your doctor for medical advice about side effects. You may report side effects to FDA at 1-800-FDA-1088. Where should I keep my medicine? This drug is given in a hospital or clinic and will not be stored at home. NOTE: This sheet is a summary. It may not cover all possible information. If you have questions about this medicine, talk to your doctor, pharmacist, or health care provider.  2020 Elsevier/Gold Standard (2018-06-15  22:01:36)  

## 2020-02-05 NOTE — Progress Notes (Signed)
Met with patient at registration to obtain signature for [employer].  Patient apprved for one-time $1000 [employer] grant to assist with personal expenses while going through treatment. Gave him a copy of the approval letter and expense sheet for him to take to review with his daughter whom speaks [place].  He has my card for any additional financial questions or concerns.

## 2020-02-08 ENCOUNTER — Encounter: Payer: Self-pay | Admitting: Hematology

## 2020-02-08 NOTE — Progress Notes (Signed)
Received email from patient's daughter(Monika) that patient was approved for copay assistance through [employer].  Obtained approval letter and POE forms from portal. Patient approved for $8,000 01/17/20 - 01/15/21 for CLL fund through [employer] and will retro-back 90 days.   Advised patient's daughter.  Leaving a copy for Lenise for billing/claim submissions.  Daughter has my card for any additional financial questions or concerns.

## 2020-02-12 ENCOUNTER — Other Ambulatory Visit: Payer: Self-pay

## 2020-02-12 ENCOUNTER — Inpatient Hospital Stay: Payer: PPO

## 2020-02-12 VITALS — BP 127/74 | HR 62 | Temp 98.5°F | Resp 18

## 2020-02-12 DIAGNOSIS — Z5112 Encounter for antineoplastic immunotherapy: Secondary | ICD-10-CM | POA: Diagnosis not present

## 2020-02-12 DIAGNOSIS — Z7189 Other specified counseling: Secondary | ICD-10-CM

## 2020-02-12 DIAGNOSIS — C9110 Chronic lymphocytic leukemia of B-cell type not having achieved remission: Secondary | ICD-10-CM

## 2020-02-12 LAB — CMP (CANCER CENTER ONLY)
ALT: 23 U/L (ref 0–44)
AST: 13 U/L — ABNORMAL LOW (ref 15–41)
Albumin: 3.5 g/dL (ref 3.5–5.0)
Alkaline Phosphatase: 118 U/L (ref 38–126)
Anion gap: 6 (ref 5–15)
BUN: 18 mg/dL (ref 8–23)
CO2: 24 mmol/L (ref 22–32)
Calcium: 8.7 mg/dL — ABNORMAL LOW (ref 8.9–10.3)
Chloride: 111 mmol/L (ref 98–111)
Creatinine: 1.55 mg/dL — ABNORMAL HIGH (ref 0.61–1.24)
GFR, Est AFR Am: 53 mL/min — ABNORMAL LOW (ref >60)
GFR, Estimated: 45 mL/min — ABNORMAL LOW (ref >60)
Glucose, Bld: 106 mg/dL — ABNORMAL HIGH (ref 70–99)
Potassium: 3.6 mmol/L (ref 3.5–5.1)
Sodium: 141 mmol/L (ref 135–145)
Total Bilirubin: 0.6 mg/dL (ref 0.3–1.2)
Total Protein: 6.5 g/dL (ref 6.5–8.1)

## 2020-02-12 LAB — CBC WITH DIFFERENTIAL/PLATELET
Abs Immature Granulocytes: 0.01 10*3/uL (ref 0.00–0.07)
Basophils Absolute: 0.1 10*3/uL (ref 0.0–0.1)
Basophils Relative: 1 %
Eosinophils Absolute: 0.1 10*3/uL (ref 0.0–0.5)
Eosinophils Relative: 2 %
HCT: 45.9 % (ref 39.0–52.0)
Hemoglobin: 15.2 g/dL (ref 13.0–17.0)
Immature Granulocytes: 0 %
Lymphocytes Relative: 17 %
Lymphs Abs: 1.2 10*3/uL (ref 0.7–4.0)
MCH: 29.6 pg (ref 26.0–34.0)
MCHC: 33.1 g/dL (ref 30.0–36.0)
MCV: 89.5 fL (ref 80.0–100.0)
Monocytes Absolute: 0.4 10*3/uL (ref 0.1–1.0)
Monocytes Relative: 6 %
Neutro Abs: 5.3 10*3/uL (ref 1.7–7.7)
Neutrophils Relative %: 74 %
Platelets: 131 10*3/uL — ABNORMAL LOW (ref 150–400)
RBC: 5.13 MIL/uL (ref 4.22–5.81)
RDW: 14 % (ref 11.5–15.5)
WBC: 7.2 10*3/uL (ref 4.0–10.5)
nRBC: 0 % (ref 0.0–0.2)

## 2020-02-12 MED ORDER — DIPHENHYDRAMINE HCL 25 MG PO CAPS
ORAL_CAPSULE | ORAL | Status: AC
  Filled 2020-02-12: qty 2

## 2020-02-12 MED ORDER — SODIUM CHLORIDE 0.9 % IV SOLN
375.0000 mg/m2 | Freq: Once | INTRAVENOUS | Status: AC
  Administered 2020-02-12: 800 mg via INTRAVENOUS
  Filled 2020-02-12: qty 30

## 2020-02-12 MED ORDER — METHYLPREDNISOLONE SODIUM SUCC 125 MG IJ SOLR
125.0000 mg | Freq: Every day | INTRAMUSCULAR | Status: DC
  Administered 2020-02-12: 125 mg via INTRAVENOUS

## 2020-02-12 MED ORDER — ACETAMINOPHEN 325 MG PO TABS
ORAL_TABLET | ORAL | Status: AC
  Filled 2020-02-12: qty 2

## 2020-02-12 MED ORDER — DIPHENHYDRAMINE HCL 25 MG PO CAPS
50.0000 mg | ORAL_CAPSULE | Freq: Once | ORAL | Status: AC
  Administered 2020-02-12: 50 mg via ORAL

## 2020-02-12 MED ORDER — FAMOTIDINE IN NACL 20-0.9 MG/50ML-% IV SOLN
20.0000 mg | Freq: Once | INTRAVENOUS | Status: AC
  Administered 2020-02-12: 20 mg via INTRAVENOUS

## 2020-02-12 MED ORDER — METHYLPREDNISOLONE SODIUM SUCC 125 MG IJ SOLR
INTRAMUSCULAR | Status: AC
  Filled 2020-02-12: qty 2

## 2020-02-12 MED ORDER — SODIUM CHLORIDE 0.9 % IV SOLN
Freq: Once | INTRAVENOUS | Status: AC
  Filled 2020-02-12: qty 250

## 2020-02-12 MED ORDER — FAMOTIDINE IN NACL 20-0.9 MG/50ML-% IV SOLN
INTRAVENOUS | Status: AC
  Filled 2020-02-12: qty 50

## 2020-02-12 MED ORDER — ACETAMINOPHEN 325 MG PO TABS
650.0000 mg | ORAL_TABLET | Freq: Once | ORAL | Status: AC
  Administered 2020-02-12: 650 mg via ORAL

## 2020-02-16 ENCOUNTER — Other Ambulatory Visit: Payer: Self-pay

## 2020-02-16 DIAGNOSIS — C9110 Chronic lymphocytic leukemia of B-cell type not having achieved remission: Secondary | ICD-10-CM

## 2020-02-19 ENCOUNTER — Inpatient Hospital Stay (HOSPITAL_BASED_OUTPATIENT_CLINIC_OR_DEPARTMENT_OTHER): Payer: PPO | Admitting: Hematology

## 2020-02-19 ENCOUNTER — Other Ambulatory Visit: Payer: Self-pay

## 2020-02-19 ENCOUNTER — Inpatient Hospital Stay: Payer: PPO | Attending: Hematology and Oncology

## 2020-02-19 ENCOUNTER — Inpatient Hospital Stay: Payer: PPO

## 2020-02-19 VITALS — BP 143/90 | HR 70 | Temp 97.1°F | Resp 18 | Ht 72.0 in | Wt 188.7 lb

## 2020-02-19 VITALS — BP 131/85 | HR 62 | Temp 97.7°F | Resp 16

## 2020-02-19 DIAGNOSIS — C9110 Chronic lymphocytic leukemia of B-cell type not having achieved remission: Secondary | ICD-10-CM | POA: Diagnosis not present

## 2020-02-19 DIAGNOSIS — Z5112 Encounter for antineoplastic immunotherapy: Secondary | ICD-10-CM

## 2020-02-19 DIAGNOSIS — Z7189 Other specified counseling: Secondary | ICD-10-CM

## 2020-02-19 DIAGNOSIS — D696 Thrombocytopenia, unspecified: Secondary | ICD-10-CM | POA: Diagnosis not present

## 2020-02-19 LAB — CMP (CANCER CENTER ONLY)
ALT: 20 U/L (ref 0–44)
AST: 13 U/L — ABNORMAL LOW (ref 15–41)
Albumin: 3.7 g/dL (ref 3.5–5.0)
Alkaline Phosphatase: 111 U/L (ref 38–126)
Anion gap: 7 (ref 5–15)
BUN: 18 mg/dL (ref 8–23)
CO2: 22 mmol/L (ref 22–32)
Calcium: 9 mg/dL (ref 8.9–10.3)
Chloride: 111 mmol/L (ref 98–111)
Creatinine: 1.63 mg/dL — ABNORMAL HIGH (ref 0.61–1.24)
GFR, Est AFR Am: 49 mL/min — ABNORMAL LOW (ref >60)
GFR, Estimated: 43 mL/min — ABNORMAL LOW (ref >60)
Glucose, Bld: 96 mg/dL (ref 70–99)
Potassium: 4.1 mmol/L (ref 3.5–5.1)
Sodium: 140 mmol/L (ref 135–145)
Total Bilirubin: 0.7 mg/dL (ref 0.3–1.2)
Total Protein: 6.9 g/dL (ref 6.5–8.1)

## 2020-02-19 LAB — CBC WITH DIFFERENTIAL (CANCER CENTER ONLY)
Abs Immature Granulocytes: 0.02 10*3/uL (ref 0.00–0.07)
Basophils Absolute: 0.1 10*3/uL (ref 0.0–0.1)
Basophils Relative: 1 %
Eosinophils Absolute: 0.2 10*3/uL (ref 0.0–0.5)
Eosinophils Relative: 3 %
HCT: 47.1 % (ref 39.0–52.0)
Hemoglobin: 15.4 g/dL (ref 13.0–17.0)
Immature Granulocytes: 0 %
Lymphocytes Relative: 17 %
Lymphs Abs: 1.1 10*3/uL (ref 0.7–4.0)
MCH: 29.2 pg (ref 26.0–34.0)
MCHC: 32.7 g/dL (ref 30.0–36.0)
MCV: 89.2 fL (ref 80.0–100.0)
Monocytes Absolute: 0.6 10*3/uL (ref 0.1–1.0)
Monocytes Relative: 9 %
Neutro Abs: 4.8 10*3/uL (ref 1.7–7.7)
Neutrophils Relative %: 70 %
Platelet Count: 133 10*3/uL — ABNORMAL LOW (ref 150–400)
RBC: 5.28 MIL/uL (ref 4.22–5.81)
RDW: 13.9 % (ref 11.5–15.5)
WBC Count: 6.8 10*3/uL (ref 4.0–10.5)
nRBC: 0 % (ref 0.0–0.2)

## 2020-02-19 MED ORDER — METHYLPREDNISOLONE SODIUM SUCC 125 MG IJ SOLR
INTRAMUSCULAR | Status: AC
  Filled 2020-02-19: qty 2

## 2020-02-19 MED ORDER — DIPHENHYDRAMINE HCL 25 MG PO CAPS
ORAL_CAPSULE | ORAL | Status: AC
  Filled 2020-02-19: qty 2

## 2020-02-19 MED ORDER — ACETAMINOPHEN 325 MG PO TABS
650.0000 mg | ORAL_TABLET | Freq: Once | ORAL | Status: AC
  Administered 2020-02-19: 650 mg via ORAL

## 2020-02-19 MED ORDER — FAMOTIDINE IN NACL 20-0.9 MG/50ML-% IV SOLN
INTRAVENOUS | Status: AC
  Filled 2020-02-19: qty 50

## 2020-02-19 MED ORDER — METHYLPREDNISOLONE SODIUM SUCC 125 MG IJ SOLR
125.0000 mg | Freq: Every day | INTRAMUSCULAR | Status: DC
  Administered 2020-02-19: 125 mg via INTRAVENOUS

## 2020-02-19 MED ORDER — SODIUM CHLORIDE 0.9 % IV SOLN
Freq: Once | INTRAVENOUS | Status: AC
  Filled 2020-02-19: qty 250

## 2020-02-19 MED ORDER — SODIUM CHLORIDE 0.9 % IV SOLN
375.0000 mg/m2 | Freq: Once | INTRAVENOUS | Status: AC
  Administered 2020-02-19: 800 mg via INTRAVENOUS
  Filled 2020-02-19: qty 50

## 2020-02-19 MED ORDER — FAMOTIDINE IN NACL 20-0.9 MG/50ML-% IV SOLN
20.0000 mg | Freq: Once | INTRAVENOUS | Status: AC
  Administered 2020-02-19: 20 mg via INTRAVENOUS

## 2020-02-19 MED ORDER — DIPHENHYDRAMINE HCL 25 MG PO CAPS
50.0000 mg | ORAL_CAPSULE | Freq: Once | ORAL | Status: AC
  Administered 2020-02-19: 50 mg via ORAL

## 2020-02-19 MED ORDER — ACETAMINOPHEN 325 MG PO TABS
ORAL_TABLET | ORAL | Status: AC
  Filled 2020-02-19: qty 2

## 2020-02-19 NOTE — Patient Instructions (Signed)
Rituximab injection What is this medicine? RITUXIMAB (ri TUX i mab) is a monoclonal antibody. It is used to treat certain types of cancer like non-Hodgkin lymphoma and chronic lymphocytic leukemia. It is also used to treat rheumatoid arthritis, granulomatosis with polyangiitis (or Wegener's granulomatosis), microscopic polyangiitis, and pemphigus vulgaris. This medicine may be used for other purposes; ask your health care provider or pharmacist if you have questions. COMMON BRAND NAME(S): Rituxan, RUXIENCE What should I tell my health care provider before I take this medicine? They need to know if you have any of these conditions:  heart disease  infection (especially a virus infection such as hepatitis B, chickenpox, cold sores, or herpes)  immune system problems  irregular heartbeat  kidney disease  low blood counts, like low white cell, platelet, or red cell counts  lung or breathing disease, like asthma  recently received or scheduled to receive a vaccine  an unusual or allergic reaction to rituximab, other medicines, foods, dyes, or preservatives  pregnant or trying to get pregnant  breast-feeding How should I use this medicine? This medicine is for infusion into a vein. It is administered in a hospital or clinic by a specially trained health care professional. A special MedGuide will be given to you by the pharmacist with each prescription and refill. Be sure to read this information carefully each time. Talk to your pediatrician regarding the use of this medicine in children. This medicine is not approved for use in children. Overdosage: If you think you have taken too much of this medicine contact a poison control center or emergency room at once. NOTE: This medicine is only for you. Do not share this medicine with others. What if I miss a dose? It is important not to miss a dose. Call your doctor or health care professional if you are unable to keep an appointment. What  may interact with this medicine?  cisplatin  live virus vaccines This list may not describe all possible interactions. Give your health care provider a list of all the medicines, herbs, non-prescription drugs, or dietary supplements you use. Also tell them if you smoke, drink alcohol, or use illegal drugs. Some items may interact with your medicine. What should I watch for while using this medicine? Your condition will be monitored carefully while you are receiving this medicine. You may need blood work done while you are taking this medicine. This medicine can cause serious allergic reactions. To reduce your risk you may need to take medicine before treatment with this medicine. Take your medicine as directed. In some patients, this medicine may cause a serious brain infection that may cause death. If you have any problems seeing, thinking, speaking, walking, or standing, tell your healthcare professional right away. If you cannot reach your healthcare professional, urgently seek other source of medical care. Call your doctor or health care professional for advice if you get a fever, chills or sore throat, or other symptoms of a cold or flu. Do not treat yourself. This drug decreases your body's ability to fight infections. Try to avoid being around people who are sick. Do not become pregnant while taking this medicine or for at least 12 months after stopping it. Women should inform their doctor if they wish to become pregnant or think they might be pregnant. There is a potential for serious side effects to an unborn child. Talk to your health care professional or pharmacist for more information. Do not breast-feed an infant while taking this medicine or for at   least 6 months after stopping it. What side effects may I notice from receiving this medicine? Side effects that you should report to your doctor or health care professional as soon as possible:  allergic reactions like skin rash, itching or  hives; swelling of the face, lips, or tongue  breathing problems  chest pain  changes in vision  diarrhea  headache with fever, neck stiffness, sensitivity to light, nausea, or confusion  fast, irregular heartbeat  loss of memory  low blood counts - this medicine may decrease the number of white blood cells, red blood cells and platelets. You may be at increased risk for infections and bleeding.  mouth sores  problems with balance, talking, or walking  redness, blistering, peeling or loosening of the skin, including inside the mouth  signs of infection - fever or chills, cough, sore throat, pain or difficulty passing urine  signs and symptoms of kidney injury like trouble passing urine or change in the amount of urine  signs and symptoms of liver injury like dark yellow or brown urine; general ill feeling or flu-like symptoms; light-colored stools; loss of appetite; nausea; right upper belly pain; unusually weak or tired; yellowing of the eyes or skin  signs and symptoms of low blood pressure like dizziness; feeling faint or lightheaded, falls; unusually weak or tired  stomach pain  swelling of the ankles, feet, hands  unusual bleeding or bruising  vomiting Side effects that usually do not require medical attention (report to your doctor or health care professional if they continue or are bothersome):  headache  joint pain  muscle cramps or muscle pain  nausea  tiredness This list may not describe all possible side effects. Call your doctor for medical advice about side effects. You may report side effects to FDA at 1-800-FDA-1088. Where should I keep my medicine? This drug is given in a hospital or clinic and will not be stored at home. NOTE: This sheet is a summary. It may not cover all possible information. If you have questions about this medicine, talk to your doctor, pharmacist, or health care provider.  2020 Elsevier/Gold Standard (2018-06-15  22:01:36)  

## 2020-02-19 NOTE — Progress Notes (Signed)
HEMATOLOGY/ONCOLOGY CLINIC NOTE  Date of Service: 02/19/2020  Patient Care Team: Patient, No Pcp Per as PCP - General (General Practice)  CHIEF COMPLAINTS/PURPOSE OF CONSULTATION:  F/u for Chronic Lymphocytic Leukemia   Oncologic History:  [name] with CLL after a CLL FISH study revealed heterozygous 13q deletion within 92% cells. Subsequent imaging showed diffuse non-bulky lymphadenopathy in chest, abdomen, and pelvis with splenomegaly of 1200 cm^3. The pt began daily Ibrutinib 420mg  on 01/27/17 after becoming increasing fatigued and lost 15 pounds of weight over 4-5 months.   HISTORY OF PRESENTING ILLNESS:   Wesley Bright is a wonderful 69 y.o. male who has been previously seen by my colleague Dr Grace Isaac for evaluation and management of Chronic Lymphocytic Leukemia. He is accompanied today by his daughter and wife. The pt reports that he is doing well overall.   The pt reports that he is feeling better and his energy levels have been improving. He has continued to take Vitamin D, Vitamin B12 and co-enzyme Q. He notes no problems taking his Ibrutinib besides mild rashes that come and go without itching or bothering him very much.   He notes that he has had problems in the past with fluid in the abdomen, and has taken medication for this. He has also intermittently had constipation.   Most recent lab results (11/03/17) of CBC  is as follows: all values are WNL except for WBC at 30.3k, HGB at 12.3, HCT at 37.9, RDW at 15.0, PLT at 109k, Lymphs abs at 26.8k, Creatinine at 1.62, Total Protein at 6.1, Total bilirubin at <0.2, GFR at 43. LDH 6/19;19 is WNL at 164 Uric Acid 11/03/17 is WNL at 4.3  On review of systems, pt reports mild skin rashes, increasing energy levels, and denies mouth sores, fevers, chills, diarrhea, inguinal rashes, itching, vision changes, abdominal pains, leg swelling, and any other symptoms.   Interval History:   Wesley Bright  returns today for management and evaluation of his CLL. He is here for C4 Rituxan. The patient's last visit with [place] was on 02/05/2020. The pt reports that he is doing well overall.  The pt reports no acute new symptoms. Tolerating Rituxan without any issues or new toxicities. Feeling better with less fatigue off Ibrutinib  Lab results today (02/19/20) - reviewed with patient.  On review of systems, pt reports no fevers/chills/nigh sweats or weight loss.    MEDICAL HISTORY:  Past Medical History:  Diagnosis Date  . External hemorrhoid     SURGICAL HISTORY: Past Surgical History:  Procedure Laterality Date  . APPENDECTOMY    . INGUINAL HERNIA REPAIR     left  . KNEE SURGERY     Left Knee    SOCIAL HISTORY: Social History   Socioeconomic History  . Marital status: Married    Spouse name: Not on file  . Number of children: 2  . Years of education: Not on file  . Highest education level: Not on file  Occupational History  . 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 Smoking status: Current Every Day Smoker    Packs/day: 0.50  . Smokeless tobacco: Never Used  Vaping Use  . Vaping Use: Never used  Substance and Sexual Activity  . Alcohol use: Yes    Comment: occasional  . Drug use: No  . Sexual activity: Not on file  Other Topics Concern  . Not on file  Social History Narrative  .  Not on file   Social Determinants of Health   Financial Resource Strain:   . Difficulty of Paying Living Expenses: Not on file  Food Insecurity:   . Worried About Charity fundraiser in the Last Year: Not on file  . Ran Out of Food in the Last Year: Not on file  Transportation Needs:   . Lack of Transportation (Medical): Not on file  . Lack of Transportation (Non-Medical): Not on file  Physical Activity:   . Days of Exercise per Week: Not on file  . Minutes of Exercise per Session: Not on file  Stress:   . Feeling of Stress : Not on file  Social  Connections:   . Frequency of Communication with Friends and Family: Not on file  . Frequency of Social Gatherings with Friends and Family: Not on file  . Attends Religious Services: Not on file  . Active Member of Clubs or Organizations: Not on file  . Attends Archivist Meetings: Not on file  . Marital Status: Not on file  Intimate Partner Violence:   . Fear of Current or Ex-Partner: Not on file  . Emotionally Abused: Not on file  . Physically Abused: Not on file  . Sexually Abused: Not on file    FAMILY HISTORY: Family History  Problem Relation Age of Onset  . Liver cancer Brother   . Cancer Brother        Liver  . Esophageal cancer Father   . Cancer Father        Throat   . Cancer Mother        Bladder - cause of death    ALLERGIES:  has No Known Allergies.  MEDICATIONS:  Current Outpatient Medications  Medication Sig Dispense Refill  . amLODipine (NORVASC) 5 MG tablet Take 5 mg by mouth daily.    . cholecalciferol (VITAMIN D) 1000 units tablet Take 1,000 Units by mouth daily.    Marland Kitchen co-enzyme Q-10 30 MG capsule Take 30 mg by mouth 3 (three) times daily.    . Ibrutinib (IMBRUVICA) 420 MG TABS Take 420 mg by mouth daily. Take with a glass of water at approximately the same time each day. 28 tablet 11  . tamsulosin (FLOMAX) 0.4 MG CAPS capsule Take 0.4 mg by mouth daily.    . vitamin B-12 (CYANOCOBALAMIN) 100 MCG tablet Take 100 mcg by mouth daily.     No current facility-administered medications for this visit.    REVIEW OF SYSTEMS:   A 10+ POINT REVIEW OF SYSTEMS WAS OBTAINED including neurology, dermatology, psychiatry, cardiac, respiratory, lymph, extremities, GI, GU, Musculoskeletal, constitutional, breasts, reproductive, HEENT.  All pertinent positives are noted in the HPI.  All others are negative.   PHYSICAL EXAMINATION: ECOG PERFORMANCE STATUS: 1 - Symptomatic but completely ambulatory  There were no vitals filed for this visit. There were no vitals  filed for this visit. .There is no height or weight on file to calculate BMI.   NAD GENERAL:alert, in no acute distress and comfortable SKIN: no acute rashes, no significant lesions EYES: conjunctiva are pink and non-injected, sclera anicteric OROPHARYNX: MMM, no exudates, no oropharyngeal erythema or ulceration NECK: supple, no JVD LYMPH:  no palpable lymphadenopathy in the cervical, axillary or inguinal regions LUNGS: clear to auscultation b/l with normal respiratory effort HEART: regular rate & rhythm ABDOMEN:  normoactive bowel sounds , non tender, not distended. No palpable hepatosplenomegaly.  Extremity: no pedal edema PSYCH: alert & oriented x 3 with fluent speech  NEURO: no focal motor/sensory deficits  LABORATORY DATA:  I have reviewed the data as listed  . CBC Latest Ref Rng & Units 02/12/2020 02/05/2020 01/29/2020  WBC 4.0 - 10.5 K/uL 7.2 6.9 15.0(H)  Hemoglobin 13.0 - 17.0 g/dL 15.2 15.3 15.2  Hematocrit 39 - 52 % 45.9 46.9 46.7  Platelets 150 - 400 K/uL 131(L) 113(L) 103(L)    . CMP Latest Ref Rng & Units 02/12/2020 02/05/2020 01/29/2020  Glucose 70 - 99 mg/dL 106(H) 91 82  BUN 8 - 23 mg/dL 18 22 15   Creatinine 0.61 - 1.24 mg/dL 1.55(H) 1.60(H) 1.62(H)  Sodium 135 - 145 mmol/L 141 140 140  Potassium 3.5 - 5.1 mmol/L 3.6 3.8 3.7  Chloride 98 - 111 mmol/L 111 113(H) 109  CO2 22 - 32 mmol/L 24 20(L) 24  Calcium 8.9 - 10.3 mg/dL 8.7(L) 8.6(L) 8.9  Total Protein 6.5 - 8.1 g/dL 6.5 6.7 6.8  Total Bilirubin 0.3 - 1.2 mg/dL 0.6 0.4 0.6  Alkaline Phos 38 - 126 U/L 118 107 105  AST 15 - 41 U/L 13(L) 21 14(L)  ALT 0 - 44 U/L 23 32 16   06/20/2019 Kidney Biopsy:    12/31/16 Flow Cytometry:    12/15/16 CLL FISH:    RADIOGRAPHIC STUDIES: I have personally reviewed the radiological images as listed and agreed with the findings in the report. No results found.  ASSESSMENT & PLAN:  69 y.o. male with  1. Chronic Lymphocytic Leukemia - monoalleilic 56C deletion. Was  started on Ibrutinib from 01/2017 due to fatigue, weight loss and significant splenomegaly.  2. H/o Grade 1 intermittent rash ?related to Ibrutinib - no currently an issue.  PLAN:   -The pt has no prohibitive toxicities from continuing C4 Rituxan at this time. Will continue weekly.  -labs stable, no lymphocytosis. -patient is now off Ibrutinib.and notes improved fatigue. -Patient lymphocytosis has resolved. NO clinical LNadenopathy or splenomegaly-- we shall re-evaluate patient in 3 months with rpt labs and plan to follow with surveillance if counts remain stable.   3.  Patient Active Problem List   Diagnosis Date Noted  . Counseling regarding advance care planning and goals of care 01/08/2020  . Skin rash 10/03/2017  . CLL (chronic lymphocytic leukemia) ([place]) 12/09/2016  . General weakness 12/07/2016  . Smoker 12/07/2016  . Loss of weight 12/07/2016  . GERD (gastroesophageal reflux disease) 12/30/2010  . Constipation 12/30/2010  . Hemorrhoids, external 12/30/2010    FOLLOW UP: Patient will complete planned 4th weekyl dose of rituxan today RTC with Dr Irene Limbo with labs in 3 months    The total time spent in the appt was 20 minutes and more than 50% was on counseling and direct patient cares.  All of the patient's questions were answered with apparent satisfaction. The patient knows to call the clinic with any problems, questions or concerns.   Sullivan Lone MD [place] AAHIVMS [facility] Hematology/Oncology Physician [facility]  (Office):       [phone] (Work cell):  [phone] (Fax):           [phone]  02/19/2020 7:53 AM  I, Yevette Edwards, am acting as a scribe for Dr. Sullivan Lone.   .I have reviewed the above documentation for accuracy and completeness, and I agree with the above. Brunetta Genera MD

## 2020-02-20 ENCOUNTER — Telehealth: Payer: Self-pay

## 2020-02-20 NOTE — Telephone Encounter (Signed)
Oral Oncology Patient Advocate Encounter   Was successful in securing patient an $5 grant from Patient [facility]) to provide copayment coverage for Imbruvica.  This will keep the out of pocket expense at $0.     I have spoken with the patient.    The billing information is as follows and has been shared with [place].   Member ID: [id number] Group ID: [id number] RxBin: [id number] Dates of Eligibility: 02/23/20 through 02/21/21  Fund:  [place] Patient [place] Phone [phone] Fax [phone] 02/20/2020 4:02 PM

## 2020-03-05 ENCOUNTER — Ambulatory Visit (INDEPENDENT_AMBULATORY_CARE_PROVIDER_SITE_OTHER): Payer: PPO | Admitting: Family Medicine

## 2020-03-05 ENCOUNTER — Encounter: Payer: Self-pay | Admitting: Family Medicine

## 2020-03-05 ENCOUNTER — Other Ambulatory Visit: Payer: Self-pay

## 2020-03-05 VITALS — BP 163/82 | HR 73 | Temp 97.9°F | Ht 72.0 in | Wt 189.0 lb

## 2020-03-05 DIAGNOSIS — W19XXXA Unspecified fall, initial encounter: Secondary | ICD-10-CM

## 2020-03-05 DIAGNOSIS — K219 Gastro-esophageal reflux disease without esophagitis: Secondary | ICD-10-CM | POA: Diagnosis not present

## 2020-03-05 DIAGNOSIS — R140 Abdominal distension (gaseous): Secondary | ICD-10-CM | POA: Diagnosis not present

## 2020-03-05 DIAGNOSIS — K5900 Constipation, unspecified: Secondary | ICD-10-CM | POA: Diagnosis not present

## 2020-03-05 NOTE — Patient Instructions (Addendum)
Ct scan has been ordered will follow up with results. Referral to GI has been placed to discuss chronic constipation and bloating For any chest pain, shortness of breath, dizziness, confusion go to the ED. Continue to monitor Blood pressure at home.   Chronic Constipation  Chronic constipation is a condition in which a person has three or fewer bowel movements a week, for three months or longer. This condition is especially common in older adults. The two main kinds of chronic constipation are secondary constipation and functional constipation. Secondary constipation results from another condition or a treatment. Functional constipation, also called primary or idiopathic constipation, is divided into three types:  Normal transit constipation. In this type, movement of stool through the colon (stool transit) occurs normally.  Slow transit constipation. In this type, stool moves slowly through the colon.  Outlet constipation or pelvic floor dysfunction. In this type, the nerves and muscles that empty the rectum do not work normally. What are the causes? Causes of secondary constipation may include:  Failing to drink enough fluid, eat enough food or fiber, or get physically active.  Pregnancy.  A tear in the anus (anal fissure).  Blockage in the bowel (bowel obstruction).  Narrowing of the bowel (bowel stricture).  Having a long-term medical condition, such as: ? Diabetes. ? Hypothyroidism. ? Multiple sclerosis. ? Parkinson disease. ? Stroke. ? Spinal cord injury. ? Dementia. ? Colon cancer. ? Inflammatory bowel disease (IBD). ? Iron-deficiency anemia. ? Outward collapse of the rectum (rectal prolapse). ? Hemorrhoids.  Taking certain medicines, including: ? Narcotics. These are a certain type of prescription pain medicine. ? Antacids. ? Iron supplements. ? Water pills (diuretics). ? Certain blood pressure medicines. ? Anti-seizure  medicines. ? Antidepressants. ? Medicines for Parkinson disease. The cause of functional constipation is not known, but some conditions are associated with it. These conditions include:  Stress.  Problems in the nerves and muscles that control stool transit.  Weak or impaired pelvic floor muscles. What increases the risk? You may be at higher risk for chronic constipation if you:  Are older than age 75.  Are male.  Live in a long-term care facility.  Do not get much exercise or physical activity (have a sedentary lifestyle).  Do not drink enough fluids.  Do not eat enough food, especially fiber.  Have a long-term disease.  Have a mental health disorder or eating disorder.  Take many medicines. What are the signs or symptoms? The main symptom of chronic constipation is having three or fewer bowel movements a week for several weeks. Other signs and symptoms may vary from person to person. These include:  Pushing hard (straining) to pass stool.  Painful bowel movements.  Having hard or lumpy stools.  Having lower belly discomfort, such as cramps or bloating.  Being unable to have a bowel movement when you feel the urge.  Feeling like you still need to pass stool after a bowel movement.  Feeling that you have something in your rectum that is blocking or preventing bowel movements.  Seeing blood on the toilet paper or in your stool.  Worsening confusion (in older adults). How is this diagnosed? This condition may be diagnosed based on:  Symptoms and medical history. You will be asked about your symptoms, lifestyle, diet, and any medicines that you are taking.  Physical exam. ? Your belly (abdomen) will be examined. ? A digital rectal exam may be done. For this exam, a health care provider places a lubricated, gloved finger into  the rectum.  Other tests to check for any underlying causes of your constipation. These may be ordered if you have bleeding in your  rectum, weight loss, or a family history of colon cancer. In these cases, you may have: ? Imaging studies of the colon. These may include X-ray, ultrasound, or CT scan. ? Blood tests. ? A procedure to examine the inside of your colon (colonoscopy). ? More specialized tests to check:  Whether your anal sphincter works well. This is a ring-shaped muscle that controls the closing of the anus.  How well food moves through your colon. ? Tests to measure the nerve signal in your pelvic floor muscles (electromyography). How is this treated? Treatment for chronic constipation depends on the cause. Most often, treatment starts with:  Being more active and getting regular exercise.  Drinking more fluids.  Adding fiber to your diet. Sources of fiber include fruits, vegetables, whole grains, and fiber supplements.  Using medicines such as stool softeners or medicines that increase contractions in your digestive system (pro-motility agents).  Training your pelvic muscles with biofeedback.  Surgery, if there is obstruction. Treatment for secondary chronic constipation depends on the underlying condition. You may need to:  Stop or change some medicines if they cause constipation.  Use a fiber supplement (bulk laxative) or stool softener.  Use prescription laxative. This works by [employer] into your colon (osmotic laxative). You may also need to see a specialist who treats conditions of the digestive system (gastroenterologist). Follow these instructions at home:   Take over-the-counter and prescription medicines only as told by your health care provider.  If you are taking a laxative, take it as told by your health care provider.  Eat a balanced diet that includes enough fiber. Ask your health care provider to recommend a diet that is right for you.  Drink clear fluids, especially water. Avoid drinking alcohol, caffeine, and soda.  Drink enough fluid to keep your urine pale  yellow.  Get some physical activity every day. Ask your health care provider what physical activities are safe for you.  Get colon cancer screenings as told by your health care provider.  Keep all follow-up visits as told by your health care provider. This is important. Contact a health care provider if:  You are having three or fewer bowel movements a week.  Your stools are hard or lumpy.  You notice blood on the toilet paper or in your stool after you have a bowel movement.  You have unexplained weight loss.  You have rectum (rectal) pain.  You have stool leakage.  You experience nausea or vomiting. Get help right away if:  You have rectal bleeding or you pass blood clots.  You have severe rectal pain.  You have body tissue that pushes out (protrudes) from your anus.  You have severe pain or bloating (distension) in your abdomen.  You have vomiting that you cannot control. Summary  Chronic constipation is a condition in which a person has three or fewer bowel movements a week, for three months or longer.  You may have a higher risk for this condition if you are an older adult, or if you do not drink enough water or get enough physical activity (are sedentary).  Treatment for this condition depends on the cause. Most treatments for chronic constipation include adding fiber to your diet, drinking more fluids, and getting more physical activity. You may also need to treat any underlying medical conditions or stop or change certain medicines  if they cause constipation.  If lifestyle changes do not relieve constipation, your health care provider may recommend taking a laxative. This information is not intended to replace advice given to you by your health care provider. Make sure you discuss any questions you have with your health care provider. Document Revised: 04/16/2017 Document Reviewed: 01/19/2017 Elsevier Patient Education  2020 [place] and  Fractures  Falls can be very serious, especially for older adults or people with osteoporosis  Falls can be caused by:  Tripping or slipping  Slow reflexes  Balance problems  Reduced muscle strength  Poor vision or a recent change in prescription  Illness and some medications (especially blood pressure pills, diuretics, heart medicines, muscle relaxants and sleep medications)  Drinking alcohol  To prevent falls outdoors:  Use a can or walker if needed  Wear rubber-soled shoes so you don't slip  DO NOT buy "shape up" shoes with rocker bottom soles if you have balance problems.  The thick soles and shape make it more difficult to keep your balance.  Put kitty litter or salt on icy sidewalks  Walk on the grass if the sidewalks are slick  Avoid walking on uneven ground whenever possible  T prevent falls indoors:  Keep rooms clutter-free, especially hallways, stairs and paths to light switches  Remove throw rugs  Install night lights, especially to and in the bathroom  Turn on lights before going downstairs  Keep a flashlight next to your bed  Buy a cordless phone to keep with you instead of jumping up to answer the phone  Install grab bars in the bathroom near the shower and toilet  Install rails on both sides of the stairs.  Make sure the stairs are well lit  Wear slippers with non-skid soles.  Do not walk around in stockings or socks  Balance problems and dizziness are not a normal part of growing older.  If you begin having balance problems or dizziness see your doctor.  Physical Therapy can help you with many balance problems, strengthening hip and leg muscles and with gait training.  To keep your bones healthy make sure you are getting enough calcium and Vitamin D each day.  Ask your doctor or pharmacist about supplements.  Regular weight-bearing exercise like walking, lifting weights or dancing can help strengthen bones and prevent osteoporosis. If you have  lab work done today you will be contacted with your lab results within the next 2 weeks.  If you have not heard from [place] then please contact us. The fastest way to get your results is to register for My Chart.   IF you received an x-ray today, you will receive an invoice from [facility]. Please contact [facility] at [phone] with questions or concerns regarding your invoice.   IF you received labwork today, you will receive an invoice from [place]. Please contact [facility] at [phone] with questions or concerns regarding your invoice.   Our billing staff will not be able to assist you with questions regarding bills from these companies.  You will be contacted with the lab results as soon as they are available. The fastest way to get your results is to activate your My Chart account. Instructions are located on the last page of this paperwork. If you have not heard from [place] regarding the results in 2 weeks, please contact this office.

## 2020-03-05 NOTE — Progress Notes (Signed)
10/19/20215:16 PM  Lambert Jeanty Mar 02, 1951, 69 y.o., male [id number]  Chief Complaint  Patient presents with  . stomach bloating    chronic constipation  . Fall    10/15    HPI:   Patient is a 69 y.o. male with past medical history significant for CLL and CKD 3b who presents today for Recent fall and GI issues.  Last Friday was not feeling good (bloated) Fell down while working [place] the [place] [employer] Hit head when he fell.  LOC for a minute or two per wife. Abrasions to right arm and abdomen healing. Denies Headaches, dizziness, changes in vision, confusion since Denies chest pain, SOB, palpitations 2nd time this happened, last dizzy spell was 3 years ago  Recommend to go to ED for Ct scan (order placed)  Long term stomach issues involving chronic constipation, bloating Started 15 years ago Has tried multiple different kinds of laxatives and stool softeners Takes Exlax every evening before go to bed Eats home cooked diet, no recent changes in diet or activity Last colonoscopy 12/2010 Denies blood in stool, nausea, vomiting States his mother had similar issues  CLL Stopped treatment  Hypertension Recheck 150/87 Check BP at home: 130's at home,  Amlodipine 5mg  prescribed by nephrology  BP Readings from Last 3 Encounters:  03/05/20 (!) 163/82  02/19/20 131/85  02/19/20 (!) 143/90   Wt Readings from Last 3 Encounters:  03/05/20 189 lb (85.7 kg)  02/19/20 188 lb 11.2 oz (85.6 kg)  02/05/20 190 lb 9.6 oz (86.5 kg)    Depression screen [facility] 2/9 03/05/2020 12/07/2016  Decreased Interest 0 0  Down, Depressed, Hopeless 0 0  PHQ - 2 Score 0 0    Fall Risk  03/05/2020 12/07/2016  Falls in the past year? 1 No  Number falls in past yr: 0 -  Injury with Fall? 1 -  Follow up Falls evaluation completed -     No Known Allergies  Prior to Admission medications   Medication Sig Start Date End Date Taking? Authorizing Provider  amLODipine (NORVASC) 5 MG tablet Take 5 mg  by mouth daily.   Yes [provider]  cholecalciferol (VITAMIN D) 1000 units tablet Take 1,000 Units by mouth daily.   Yes [provider]  co-enzyme Q-10 30 MG capsule Take 30 mg by mouth 3 (three) times daily.   Yes [provider]  Ibrutinib (IMBRUVICA) 420 MG TABS Take 420 mg by mouth daily. Take with a glass of water at approximately the same time each day. 03/16/19  Yes Brunetta Genera, MD  tamsulosin (FLOMAX) 0.4 MG CAPS capsule Take 0.4 mg by mouth daily. 06/13/19  Yes [provider]  vitamin B-12 (CYANOCOBALAMIN) 100 MCG tablet Take 100 mcg by mouth daily.   Yes [provider]    Past Medical History:  Diagnosis Date  . External hemorrhoid   . Hypertension   . Leukemia ([place]) 2018    Past Surgical History:  Procedure Laterality Date  . APPENDECTOMY    . INGUINAL HERNIA REPAIR     left  . KNEE SURGERY     Left Knee    Social History   Tobacco Use  . Smoking status: Current Every Day Smoker    Packs/day: 0.50    Years: 50.00    Pack years: 25.00  . Smokeless tobacco: Never Used  Substance Use Topics  . Alcohol use: Yes    Comment: occasional    Family History  Problem Relation Age of  Onset  . Liver cancer Brother   . Cancer Brother        Liver  . Esophageal cancer Father   . Cancer Father        Throat   . Cancer Mother        Bladder - cause of death    Review of Systems  Constitutional: Negative for chills, fever, malaise/fatigue and weight loss.  HENT: Negative for hearing loss.   Eyes: Negative for blurred vision and double vision.  Respiratory: Negative for cough, shortness of breath and wheezing.   Cardiovascular: Negative for chest pain, palpitations and leg swelling.  Gastrointestinal: Negative for abdominal pain, blood in stool, constipation, diarrhea, heartburn, nausea and vomiting.  Genitourinary: Negative for dysuria, frequency and hematuria.  Musculoskeletal: Negative for back pain and  joint pain.  Skin: Negative for rash.  Neurological: Negative for dizziness, tingling, weakness and headaches.  Psychiatric/Behavioral: Negative for suicidal ideas.     OBJECTIVE:  Today's Vitals   03/05/20 1554  BP: (!) 163/82  Pulse: 73  Temp: 97.9 F (36.6 C)  SpO2: 97%  Weight: 189 lb (85.7 kg)  Height: 6' (1.829 m)   Body mass index is 25.63 kg/m.   Physical Exam Vitals reviewed.  Constitutional:      Appearance: Normal appearance.  HENT:     Head: Normocephalic and atraumatic.  Eyes:     Extraocular Movements: Extraocular movements intact.     Conjunctiva/sclera: Conjunctivae normal.     Pupils: Pupils are equal, round, and reactive to light.  Cardiovascular:     Rate and Rhythm: Normal rate and regular rhythm.     Pulses: Normal pulses.     Heart sounds: Normal heart sounds. No murmur heard.  No friction rub. No gallop.   Pulmonary:     Effort: Pulmonary effort is normal. No respiratory distress.     Breath sounds: Normal breath sounds. No stridor. No wheezing or rales.  Abdominal:     General: Bowel sounds are normal. There is no distension.     Palpations: Abdomen is soft.     Tenderness: There is no abdominal tenderness.  Musculoskeletal:     Right lower leg: No edema.     Left lower leg: No edema.  Skin:    General: Skin is warm and dry.     Findings: Abrasion and bruising present.  Neurological:     General: No focal deficit present.     Mental Status: He is alert and oriented to person, place, and time. Mental status is at baseline.     GCS: GCS eye subscore is 4. GCS verbal subscore is 5. GCS motor subscore is 6.     Cranial Nerves: Cranial nerves are intact.     Sensory: No sensory deficit.     Motor: No weakness.     Coordination: Coordination is intact.     Gait: Gait normal.  Psychiatric:        Mood and Affect: Mood normal.        Behavior: Behavior normal.        Thought Content: Thought content normal.     No results found for  this or any previous visit (from the past 24 hour(s)).  No results found.   ASSESSMENT and PLAN  Problem List Items Addressed This Visit      Digestive   GERD (gastroesophageal reflux disease)   Relevant Orders   Ambulatory referral to Gastroenterology    Other Visit Diagnoses  Fall, initial encounter    -  Primary   Relevant Orders   EKG 12-Lead (Completed) No previous EKG to compare Rate: 63, PR: 16, QRS: .09, QT: .40, NSR    CT HEAD WO CONTRAST: Ordered, will follow up with results   Abdominal bloating       Relevant Orders   Ambulatory referral to Gastroenterology: Referral to GI has been placed to discuss chronic constipation and bloating   Constipation, unspecified constipation type       Relevant Orders   Ambulatory referral to Gastroenterology  CLL: Chronic Lymphocytic Leukemia: Followed by Oncology CKD 3b: Followed by Nephrology Hypertension Followed by Nephrology     For any chest pain, shortness of breath, dizziness, confusion go to the ED. Continue to monitor Blood pressure at home. Encouraged to establish primary care.  Return in about 4 weeks (around 04/02/2020) for Establish Primary Care when able.    Huston Foley Gerrett Loman, FNP-BC Primary Care at [facility] [place], [place] [address] Ph.  [phone] Fax [phone]

## 2020-03-06 ENCOUNTER — Ambulatory Visit
Admission: RE | Admit: 2020-03-06 | Discharge: 2020-03-06 | Disposition: A | Discharge status: Home / Self Care | Payer: PPO | Source: Ambulatory Visit | Attending: Family Medicine | Admitting: Family Medicine

## 2020-03-06 DIAGNOSIS — W19XXXA Unspecified fall, initial encounter: Secondary | ICD-10-CM

## 2020-03-06 DIAGNOSIS — R55 Syncope and collapse: Secondary | ICD-10-CM | POA: Diagnosis not present

## 2020-03-06 DIAGNOSIS — S0990XA Unspecified injury of head, initial encounter: Secondary | ICD-10-CM | POA: Diagnosis not present

## 2020-03-19 ENCOUNTER — Encounter: Payer: Self-pay | Admitting: Family Medicine

## 2020-03-21 ENCOUNTER — Telehealth: Payer: Self-pay | Admitting: Internal Medicine

## 2020-03-21 NOTE — Telephone Encounter (Signed)
Yes, thanks

## 2020-03-21 NOTE — Telephone Encounter (Signed)
Hey Dr Hilarie Fredrickson this pt is wanting to establish care with Dr Rush Landmark since he has 11/18 available to see the pt which works better for the pt's schedule, pt has last seen Dr Hilarie Fredrickson in 2012 and would like to switch, would this be ok?

## 2020-03-23 NOTE — Telephone Encounter (Signed)
As long as Dr. Hilarie Fredrickson is okay with this I am okay as well. Thanks. GM

## 2020-04-02 ENCOUNTER — Other Ambulatory Visit: Payer: Self-pay | Admitting: *Deleted

## 2020-04-02 ENCOUNTER — Encounter: Payer: Self-pay | Admitting: Hematology

## 2020-04-02 ENCOUNTER — Other Ambulatory Visit: Payer: Self-pay | Admitting: Hematology

## 2020-04-02 DIAGNOSIS — C9110 Chronic lymphocytic leukemia of B-cell type not having achieved remission: Secondary | ICD-10-CM

## 2020-04-02 DIAGNOSIS — D696 Thrombocytopenia, unspecified: Secondary | ICD-10-CM

## 2020-04-04 ENCOUNTER — Other Ambulatory Visit (INDEPENDENT_AMBULATORY_CARE_PROVIDER_SITE_OTHER): Payer: PPO

## 2020-04-04 ENCOUNTER — Inpatient Hospital Stay: Payer: PPO | Attending: Hematology and Oncology

## 2020-04-04 ENCOUNTER — Ambulatory Visit (INDEPENDENT_AMBULATORY_CARE_PROVIDER_SITE_OTHER): Payer: PPO | Admitting: Gastroenterology

## 2020-04-04 ENCOUNTER — Other Ambulatory Visit: Payer: Self-pay

## 2020-04-04 ENCOUNTER — Encounter: Payer: Self-pay | Admitting: Gastroenterology

## 2020-04-04 VITALS — BP 158/92 | HR 68 | Ht 72.0 in | Wt 188.5 lb

## 2020-04-04 DIAGNOSIS — Z86010 Personal history of colonic polyps: Secondary | ICD-10-CM

## 2020-04-04 DIAGNOSIS — K5909 Other constipation: Secondary | ICD-10-CM

## 2020-04-04 DIAGNOSIS — K2980 Duodenitis without bleeding: Secondary | ICD-10-CM | POA: Diagnosis not present

## 2020-04-04 DIAGNOSIS — R140 Abdominal distension (gaseous): Secondary | ICD-10-CM

## 2020-04-04 DIAGNOSIS — C9110 Chronic lymphocytic leukemia of B-cell type not having achieved remission: Secondary | ICD-10-CM

## 2020-04-04 DIAGNOSIS — K219 Gastro-esophageal reflux disease without esophagitis: Secondary | ICD-10-CM

## 2020-04-04 DIAGNOSIS — D696 Thrombocytopenia, unspecified: Secondary | ICD-10-CM

## 2020-04-04 LAB — CBC WITH DIFFERENTIAL (CANCER CENTER ONLY)
Abs Immature Granulocytes: 0.01 10*3/uL (ref 0.00–0.07)
Basophils Absolute: 0.1 10*3/uL (ref 0.0–0.1)
Basophils Relative: 1 %
Eosinophils Absolute: 0.1 10*3/uL (ref 0.0–0.5)
Eosinophils Relative: 2 %
HCT: 46.2 % (ref 39.0–52.0)
Hemoglobin: 15.1 g/dL (ref 13.0–17.0)
Immature Granulocytes: 0 %
Lymphocytes Relative: 23 %
Lymphs Abs: 1.3 10*3/uL (ref 0.7–4.0)
MCH: 29.3 pg (ref 26.0–34.0)
MCHC: 32.7 g/dL (ref 30.0–36.0)
MCV: 89.5 fL (ref 80.0–100.0)
Monocytes Absolute: 0.4 10*3/uL (ref 0.1–1.0)
Monocytes Relative: 8 %
Neutro Abs: 3.6 10*3/uL (ref 1.7–7.7)
Neutrophils Relative %: 66 %
Platelet Count: 148 10*3/uL — ABNORMAL LOW (ref 150–400)
RBC: 5.16 MIL/uL (ref 4.22–5.81)
RDW: 13.5 % (ref 11.5–15.5)
WBC Count: 5.5 10*3/uL (ref 4.0–10.5)
nRBC: 0 % (ref 0.0–0.2)

## 2020-04-04 LAB — CMP (CANCER CENTER ONLY)
ALT: 15 U/L (ref 0–44)
AST: 13 U/L — ABNORMAL LOW (ref 15–41)
Albumin: 3.6 g/dL (ref 3.5–5.0)
Alkaline Phosphatase: 100 U/L (ref 38–126)
Anion gap: 4 — ABNORMAL LOW (ref 5–15)
BUN: 15 mg/dL (ref 8–23)
CO2: 25 mmol/L (ref 22–32)
Calcium: 8.6 mg/dL — ABNORMAL LOW (ref 8.9–10.3)
Chloride: 111 mmol/L (ref 98–111)
Creatinine: 1.51 mg/dL — ABNORMAL HIGH (ref 0.61–1.24)
GFR, Estimated: 50 mL/min — ABNORMAL LOW (ref >60)
Glucose, Bld: 93 mg/dL (ref 70–99)
Potassium: 4.1 mmol/L (ref 3.5–5.1)
Sodium: 140 mmol/L (ref 135–145)
Total Bilirubin: 0.4 mg/dL (ref 0.3–1.2)
Total Protein: 6.5 g/dL (ref 6.5–8.1)

## 2020-04-04 LAB — IRON AND TIBC
Iron: 89 ug/dL (ref 42–163)
Saturation Ratios: 36 % (ref 20–55)
TIBC: 248 ug/dL (ref 202–409)
UIBC: 159 ug/dL (ref 117–376)

## 2020-04-04 LAB — LACTATE DEHYDROGENASE: LDH: 131 U/L (ref 98–192)

## 2020-04-04 LAB — VITAMIN D 25 HYDROXY (VIT D DEFICIENCY, FRACTURES): Vit D, 25-Hydroxy: 34.85 ng/mL (ref 30–100)

## 2020-04-04 LAB — C-REACTIVE PROTEIN: CRP: <1 mg/dL (ref 0.5–20.0)

## 2020-04-04 LAB — FERRITIN: Ferritin: 66 ng/mL (ref 24–336)

## 2020-04-04 LAB — SEDIMENTATION RATE: Sed Rate: 4 mm/hr (ref 0–20)

## 2020-04-04 LAB — VITAMIN B12: Vitamin B-12: 583 pg/mL (ref 180–914)

## 2020-04-04 LAB — TSH: TSH: 0.4 u[IU]/mL (ref 0.35–4.50)

## 2020-04-04 MED ORDER — SUPREP BOWEL PREP KIT 17.5-3.13-1.6 GM/177ML PO SOLN: 1.0000 | ORAL | 0 refills | Status: DC

## 2020-04-04 NOTE — Patient Instructions (Addendum)
Toileting tips to help with your constipation - Drink at least 64-80 ounces of water/liquid per day. - Establish a time to try to move your bowels every day.  For many people, this is after a cup of coffee or after a meal such as breakfast. - Sit all of the way back on the toilet keeping your back fairly straight and while sitting up, try to rest the tops of your forearms on your upper thighs.   - Raising your feet with a step stool/squatty potty can be helpful to improve the angle that allows your stool to pass through the rectum. - Relax the rectum feeling it bulge toward the toilet water.  If you feel your rectum raising toward your body, you are contracting rather than relaxing. - Breathe in and slowly exhale. "Belly breath" by expanding your belly towards your belly button. Keep belly expanded as you gently direct pressure down and back to the anus.  A low pitched GRRR sound can assist with increasing intra-abdominal pressure.  - Repeat 3-4 times. If unsuccessful, contract the pelvic floor to restore normal tone and get off the toilet.  Avoid excessive straining. - To reduce excessive wiping by teaching your anus to normally contract, place hands on outer aspect of knees and resist knee movement outward.  Hold 5-10 second then place hands just inside of knees and resist inward movement of knees.  Hold 5 seconds.  Repeat a few times each way.   Your provider has requested that you go to the basement level for lab work before leaving today. Press "B" on the elevator. The lab is located at the first door on the left as you exit the elevator.  You have been given samples of Linzess 145 mcg to take one tablet by mouth daily 30 mins before breakfast. Please try this for your constipation x 2 weeks and call our office with an update. Ask for Chong Sicilian, RN.  Please take over the counter Fibercon once daily.   You have been scheduled for an abdominal ultrasound at [facility] (1st floor of  hospital) on 04/10/20 at 10:30am. Please arrive 15 minutes prior to your appointment for registration. Make certain not to have anything to eat or drink 6 hours prior to your appointment. Should you need to reschedule your appointment, please contact radiology at [phone]. This test typically takes about 30 minutes to perform.  You have been scheduled for an endoscopy and colonoscopy. Please follow the written instructions given to you at your visit today. Please pick up your prep supplies at the pharmacy within the next 1-3 days. If you use inhalers (even only as needed), please bring them with you on the day of your procedure.

## 2020-04-04 NOTE — Progress Notes (Signed)
[place] VISIT   Primary Care Provider Patient, No Pcp Per No address on file None  Referring Provider Just, Laurita Quint, FNP [address] [place],  [place] [address] [phone]  Patient Profile: Wesley Bright is a 69 y.o. male with a pmh significant for CLL, hypertension, constipation, GERD, colon polyps (TAs and SSAs), hemorrhoids, family history of Head/Neck/?Esophageal cancer (father).  The patient presents to the [facility] for an evaluation and management of problem(s) noted below:  Problem List 1. Chronic constipation   2. Bloating   3. Abdominal distension   4. Hx of adenomatous colonic polyps   5. Gastroesophageal reflux disease without esophagitis   6. History of duodenitis     History of Present Illness This is the patient's first visit to the outpatient [place] clinic in years.  He previously saw Dr. Hilarie Fredrickson in 2012 and underwent both an upper and lower endoscopic evaluation with findings as below.  He was recommended a 3-year follow-up colonoscopy however did not have that completed.  The patient has a long history of chronic constipation.  He is taken multiple medications in the past without great success.  Patient has noted some changes in his bowel habits and is wondering if there is a chance of things being different inside his colon.  He is ready for another colonoscopy if it is necessary.  Patient has a history of GERD although is able to maintain symptoms without medications under good control.  Patient has not had issues of dysphagia in the recent past.  Patient denies any significant rectal bleeding.  The patient denies significant nonsteroidal or BC/Goody powder use.  After the patient has not use the restroom for a few days he begins to experience significant discomfort and bloating.  He also gets significant abdominal distention at times after eating some of the foods that are prepared at his job.  They showed picture  today of abdominal distention although does not look too much different than where he is today and he is not distended.  GI Review of Systems Positive as above Negative for odynophagia, nausea, vomiting, melena, hematochezia  Review of Systems General: Denies fevers/chills/weight loss unintentionally HEENT: Denies oral lesions Cardiovascular: Denies chest pain/palpitations Pulmonary: Denies shortness of breath Gastroenterological: See HPI Genitourinary: Denies darkened urine Hematological: Denies easy bruising/bleeding Endocrine: Denies temperature intolerance Dermatological: Denies jaundice Psychological: Mood is stable   Medications Current Outpatient Medications  Medication Sig Dispense Refill  . amLODipine (NORVASC) 5 MG tablet Take 5 mg by mouth daily.    . cholecalciferol (VITAMIN D) 1000 units tablet Take 1,000 Units by mouth daily.    Marland Kitchen co-enzyme Q-10 30 MG capsule Take 30 mg by mouth 3 (three) times daily.    . vitamin B-12 (CYANOCOBALAMIN) 100 MCG tablet Take 100 mcg by mouth daily.    . Na Sulfate-K Sulfate-Mg Sulf (SUPREP BOWEL PREP KIT) 17.5-3.13-1.6 GM/177ML SOLN Take 1 kit by mouth as directed. For colonoscopy prep 354 mL 0   No current facility-administered medications for this visit.    Allergies No Known Allergies  Histories Past Medical History:  Diagnosis Date  . External hemorrhoid   . Hypertension   . Leukemia ([place]) 2018   Past Surgical History:  Procedure Laterality Date  . APPENDECTOMY    . INGUINAL HERNIA REPAIR     left  . KNEE SURGERY     Left Knee   Social History   Socioeconomic History  . Marital status: Married    Spouse  name: Not on file  . Number of children: 2  . Years of education: Not on file  . Highest education level: Not on file  Occupational History  . Occupation: Buyer, retail: [employer]  Tobacco Use  . Smoking status: Current Every Day Smoker    Packs/day: 0.50    Years: 50.00    Pack years:  25.00  . Smokeless tobacco: Never Used  Vaping Use  . Vaping Use: Never used  Substance and Sexual Activity  . Alcohol use: Yes    Comment: occasional  . Drug use: No  . Sexual activity: Not on file  Other Topics Concern  . Not on file  Social History Narrative  . Not on file   Social Determinants of Health   Financial Resource Strain:   . Difficulty of Paying Living Expenses: Not on file  Food Insecurity:   . Worried About Charity fundraiser in the Last Year: Not on file  . Ran Out of Food in the Last Year: Not on file  Transportation Needs:   . Lack of Transportation (Medical): Not on file  . Lack of Transportation (Non-Medical): Not on file  Physical Activity:   . Days of Exercise per Week: Not on file  . Minutes of Exercise per Session: Not on file  Stress:   . Feeling of Stress : Not on file  Social Connections:   . Frequency of Communication with Friends and Family: Not on file  . Frequency of Social Gatherings with Friends and Family: Not on file  . Attends Religious Services: Not on file  . Active Member of Clubs or Organizations: Not on file  . Attends Archivist Meetings: Not on file  . Marital Status: Not on file  Intimate Partner Violence:   . Fear of Current or Ex-Partner: Not on file  . Emotionally Abused: Not on file  . Physically Abused: Not on file  . Sexually Abused: Not on file   Family History  Problem Relation Age of Onset  . Liver cancer Brother   . Cancer Brother        Liver  . Esophageal cancer Father   . Cancer Father        Throat   . Cancer Mother        Bladder - cause of death  . Stomach cancer Neg Hx   . Pancreatic cancer Neg Hx   . Colon cancer Neg Hx   . Inflammatory bowel disease Neg Hx   . Rectal cancer Neg Hx    I have reviewed his medical, social, and family history in detail and updated the electronic medical record as necessary.    PHYSICAL EXAMINATION  BP (!) 158/92 (BP Location: Left Arm, Patient  Position: Sitting, Cuff Size: Normal)   Pulse 68   Ht 6' (1.829 m)   Wt 188 lb 8 oz (85.5 kg)   BMI 25.57 kg/m  Wt Readings from Last 3 Encounters:  04/04/20 188 lb 8 oz (85.5 kg)  03/05/20 189 lb (85.7 kg)  02/19/20 188 lb 11.2 oz (85.6 kg)  GEN: NAD, appears stated age, doesn't appear chronically ill PSYCH: Cooperative, without pressured speech EYE: Conjunctivae pink, sclerae anicteric ENT: MMM, without oral ulcers, no erythema or exudates noted NECK: Supple CV: RR without R/Gs  RESP: CTAB posteriorly, without wheezing GI: NABS, soft, NT/ND, without rebound or guarding, no HSM appreciated MSK/EXT: No lower extremity edema SKIN: No jaundice NEURO:  Alert & Oriented  x 3, no focal deficits   REVIEW OF DATA  I reviewed the following data at the time of this encounter:  GI Procedures and Studies  2012 EGD Possible Barrett's at the GE junction with biopsies performed. Otherwise normal esophagus. Small hiatal hernia. Otherwise normal stomach.  Biopsies obtained to rule out H. pylori. Mild duodenitis in the bulb of the duodenum.  2012 colonoscopy 4 polyps in the cecum.  Removed and sent for pathology. 1 sessile polyp in the ascending colon.  Removed with cautery and sent for pathology. 2 polyps in the ascending colon.  Removed and sent for pathology. 1 sessile polyp in the hepatic flexure.  Removed and sent for pathology. 1 sessile polyp in the mid transverse colon.  Removed and sent for pathology. 1 sessile polyp in the descending colon.  Removed and sent for pathology. Moderate diverticulosis of the sigmoid colon. Internal and external hemorrhoids.  This is felt to be source of recent rectal bleeding.  Laboratory Studies  Reviewed those in epic  Imaging Studies  August 2018 CT abdomen pelvis with contrast IMPRESSION: 1. Notable splenomegaly (splenic volume 1200 cc) with mild paratracheal, right hilar, left infrahilar, retroperitoneal, external iliac, and celiac chain  nodal enlargement. Appearance compatible with chronic lymphocytic leukemia. 2. Prostatomegaly.  Small left scrotal hydrocele. 3. Lumbar spondylosis and degenerative disc disease causing multilevel lower lumbar impingement. 4. There are 8 small hypodense lesions in the liver which technically nonspecific, although statistically likely to represent cysts. If the patient has abnormal liver enzymes or if otherwise clinically warranted, hepatic protocol MRI with and without contrast could be utilized for further characterization. 5.  Aortic Atherosclerosis (ICD10-I70.0). 6. Sigmoid colon diverticulosis. 7. Asymmetric arthropathy in the left hip.   ASSESSMENT  Mr. Nitschke is a 69 y.o. male with a pmh significant for CLL, hypertension, constipation, GERD, colon polyps (TAs and SSAs), hemorrhoids, family history of Head/Neck/?Esophageal cancer (father).  The patient is seen today for evaluation and management of:  1. Chronic constipation   2. Bloating   3. Abdominal distension   4. Hx of adenomatous colonic polyps   5. Gastroesophageal reflux disease without esophagitis   6. History of duodenitis    The patient is hemodynamically stable.  Clinically symptoms have been present for years.  I suspect he has chronic idiopathic constipation versus IBS-C.  We will try to optimize his fiber supplementation as well as initiate him on stronger laxative therapy including the potential use of Linzess lower Trulance or Amitiza.  We will see how the patient does although I suspect he will have some clinical improvement in regards to his symptoms.  Diagnostic upper endoscopy with possible dilation will be helpful for the patient evaluation.  Colonoscopy for surveillance of prior adenomatous tissue is important as well and will be performed.  We went over toileting techniques and have put those recommendations into the patient instructions.  The risks and benefits of endoscopic evaluation were discussed with the  patient; these include but are not limited to the risk of perforation, infection, bleeding, missed lesions, lack of diagnosis, severe illness requiring hospitalization, as well as anesthesia and sedation related illnesses.  The patient is agreeable to proceed.  All patient questions were answered to the best of my ability, and the patient agrees to the aforementioned plan of action with follow-up as indicated.   PLAN  Laboratories as outlined below Inflammatory markers to help [place] understand potential for IBD Rule out celiac disease Fecal elastase to be obtained In future consider SIBO  breath testing Start FiberCon daily Initiate Linzess 145 mcg (samples given) -If improvement then will consider longer-term prescription for higher dosing for lower dosing based on how he does Abdominal ultrasound to be obtained Diagnostic endoscopy (esophageal/gastric/duodenal biopsies to be obtained and dilation) Surveillance colonoscopy to be scheduled   Orders Placed This Encounter  Procedures  . US Abdomen Complete  . Pancreatic elastase, fecal  . TSH  . Sedimentation rate  . C-reactive protein  . Tissue transglutaminase, IgA  . IgA  . Ambulatory referral to Gastroenterology    New Prescriptions   NA SULFATE-K SULFATE-MG SULF (SUPREP BOWEL PREP KIT) 17.5-3.13-1.6 GM/177ML SOLN    Take 1 kit by mouth as directed. For colonoscopy prep   Modified Medications   No medications on file    Planned Follow Up No follow-ups on file.   Total Time in Face-to-Face and in Coordination of Care for patient including independent/personal interpretation/review of prior testing, medical history, examination, medication adjustment, communicating results with the patient directly, and documentation with the EHR is 50 minutes.  Justice Britain, MD [facility]oscopy Office # [phone]

## 2020-04-05 LAB — TISSUE TRANSGLUTAMINASE, IGA: (tTG) Ab, IgA: <1 U/mL

## 2020-04-05 LAB — IGA: Immunoglobulin A: 143 mg/dL (ref 70–320)

## 2020-04-08 ENCOUNTER — Inpatient Hospital Stay (HOSPITAL_BASED_OUTPATIENT_CLINIC_OR_DEPARTMENT_OTHER): Payer: PPO | Admitting: Hematology

## 2020-04-08 DIAGNOSIS — C9110 Chronic lymphocytic leukemia of B-cell type not having achieved remission: Secondary | ICD-10-CM | POA: Diagnosis not present

## 2020-04-08 NOTE — Progress Notes (Signed)
HEMATOLOGY/ONCOLOGY CLINIC NOTE  Date of Service: 04/08/2020  Patient Care Team: Patient, No Pcp Per as PCP - General (General Practice)  CHIEF COMPLAINTS/PURPOSE OF CONSULTATION:  F/u for Chronic Lymphocytic Leukemia   Oncologic History:  [name] with CLL after a CLL FISH study revealed heterozygous 13q deletion within 92% cells. Subsequent imaging showed diffuse non-bulky lymphadenopathy in chest, abdomen, and pelvis with splenomegaly of 1200 cm^3. The pt began daily Ibrutinib 430m on 01/27/17 after becoming increasing fatigued and lost 15 pounds of weight over 4-5 months.   HISTORY OF PRESENTING ILLNESS:   GHarvis Mabusis a wonderful 69y.o. male who has been previously seen by my colleague Dr MGrace Isaacfor evaluation and management of Chronic Lymphocytic Leukemia. He is accompanied today by his daughter and wife. The pt reports that he is doing well overall.   The pt reports that he is feeling better and his energy levels have been improving. He has continued to take Vitamin D, Vitamin B12 and co-enzyme Q. He notes no problems taking his Ibrutinib besides mild rashes that come and go without itching or bothering him very much.   He notes that he has had problems in the past with fluid in the abdomen, and has taken medication for this. He has also intermittently had constipation.   Most recent lab results (11/03/17) of CBC  is as follows: all values are WNL except for WBC at 30.3k, HGB at 12.3, HCT at 37.9, RDW at 15.0, PLT at 109k, Lymphs abs at 26.8k, Creatinine at 1.62, Total Protein at 6.1, Total bilirubin at <0.2, GFR at 43. LDH 6/19;19 is WNL at 164 Uric Acid 11/03/17 is WNL at 4.3  On review of systems, pt reports mild skin rashes, increasing energy levels, and denies mouth sores, fevers, chills, diarrhea, inguinal rashes, itching, vision changes, abdominal pains, leg swelling, and any other symptoms.   Interval History:  I connected with   Wesley Bright on 04/08/20 by telephone and verified that I am speaking with the correct person using two identifiers.   I discussed the limitations of evaluation and management by telemedicine. The patient expressed understanding and agreed to proceed.  Other persons participating in the visit and their role in the encounter:        -JYevette Edwards Medical Scribe       -Mrs. Rubens, Pt's wife  Patient's location: [place]'s [place] [facility] today for management and evaluation of his CLL. The patient's last visit with [place] on 02/19/2020. The pt reports that he is doing well overall.  The pt reports that he has been feeling weak and fatigued. 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 Pt reports minor injuries from the fall. He was eating and hydrating well prior to the fall. He is no longer taking Flomax. Pt notes that his heart rate was in the high 90's once last week, but remains in the mid 80's overall. He reports dizziness when he closes his eyes during a hot shower. He has a Colonoscopy scheduled for January.  Lab results (04/04/20) of CBC w/diff and CMP is as follows: all values are WNL except for PLT at 148K, Creatinine at 1.51, Calcium at 8.6, AST at 13, GFR Est 50. 04/04/2020 LDH at 131 04/04/2020 Ferritin at 66 04/04/2020 Vitamin B12 at 583 04/04/2020 Vitamin D at 34.85 04/04/2020 Iron Panel is as follows: Iron at 89, TIBC at 248,  Sat Ratios at 36, UIBC at 159  On review of systems, pt reports weakness, fatigue, occasional dizziness and denies lightheadedness and any other symptoms.   MEDICAL HISTORY:  Past Medical History:  Diagnosis Date  . External hemorrhoid   . Hypertension   . Leukemia ([place]) 2018    SURGICAL HISTORY: Past Surgical History:  Procedure Laterality Date  . APPENDECTOMY    . INGUINAL HERNIA REPAIR     left  . KNEE SURGERY     Left Knee    SOCIAL HISTORY: Social History   Socioeconomic History   . Marital status: Married    Spouse name: Not on file  . Number of children: 2  . Years of education: Not on file  . Highest education level: Not on file  Occupational History  . Occupation: Buyer, retail: [employer]  Tobacco Use  . Smoking status: Current Every Day Smoker    Packs/day: 0.50    Years: 50.00    Pack years: 25.00  . Smokeless tobacco: Never Used  Vaping Use  . Vaping Use: Never used  Substance and Sexual Activity  . Alcohol use: Yes    Comment: occasional  . Drug use: No  . Sexual activity: Not on file  Other Topics Concern  . Not on file  Social History Narrative  . Not on file   Social Determinants of Health   Financial Resource Strain:   . Difficulty of Paying Living Expenses: Not on file  Food Insecurity:   . Worried About Charity fundraiser in the Last Year: Not on file  . Ran Out of Food in the Last Year: Not on file  Transportation Needs:   . Lack of Transportation (Medical): Not on file  . Lack of Transportation (Non-Medical): Not on file  Physical Activity:   . Days of Exercise per Week: Not on file  . Minutes of Exercise per Session: Not on file  Stress:   . Feeling of Stress : Not on file  Social Connections:   . Frequency of Communication with Friends and Family: Not on file  . Frequency of Social Gatherings with Friends and Family: Not on file  . Attends Religious Services: Not on file  . Active Member of Clubs or Organizations: Not on file  . Attends Archivist Meetings: Not on file  . Marital Status: Not on file  Intimate Partner Violence:   . Fear of Current or Ex-Partner: Not on file  . Emotionally Abused: Not on file  . Physically Abused: Not on file  . Sexually Abused: Not on file    FAMILY HISTORY: Family History  Problem Relation Age of Onset  . Liver cancer Brother   . Cancer Brother        Liver  . Esophageal cancer Father   . Cancer Father        Throat   . Cancer Mother         Bladder - cause of death  . Stomach cancer Neg Hx   . Pancreatic cancer Neg Hx   . Colon cancer Neg Hx     ALLERGIES:  has No Known Allergies.  MEDICATIONS:  Current Outpatient Medications  Medication Sig Dispense Refill  . amLODipine (NORVASC) 5 MG tablet Take 5 mg by mouth daily.    . cholecalciferol (VITAMIN D) 1000 units tablet Take 1,000 Units by mouth daily.    Marland Kitchen co-enzyme Q-10 30 MG capsule Take 30 mg by mouth 3 (  three) times daily.    . Na Sulfate-K Sulfate-Mg Sulf (SUPREP BOWEL PREP KIT) 17.5-3.13-1.6 GM/177ML SOLN Take 1 kit by mouth as directed. For colonoscopy prep 354 mL 0  . vitamin B-12 (CYANOCOBALAMIN) 100 MCG tablet Take 100 mcg by mouth daily.     No current facility-administered medications for this visit.    REVIEW OF SYSTEMS:   A 10+ POINT REVIEW OF SYSTEMS WAS OBTAINED including neurology, dermatology, psychiatry, cardiac, respiratory, lymph, extremities, GI, GU, Musculoskeletal, constitutional, breasts, reproductive, HEENT.  All pertinent positives are noted in the HPI.  All others are negative.   PHYSICAL EXAMINATION: ECOG PERFORMANCE STATUS: 1 - Symptomatic but completely ambulatory  There were no vitals filed for this visit. There were no vitals filed for this visit. .There is no height or weight on file to calculate BMI.   Telehealth visit 04/08/2020  LABORATORY DATA:  I have reviewed the data as listed  . CBC Latest Ref Rng & Units 04/04/2020 02/19/2020 02/12/2020  WBC 4.0 - 10.5 K/uL 5.5 6.8 7.2  Hemoglobin 13.0 - 17.0 g/dL 15.1 15.4 15.2  Hematocrit 39 - 52 % 46.2 47.1 45.9  Platelets 150 - 400 K/uL 148(L) 133(L) 131(L)    . CMP Latest Ref Rng & Units 04/04/2020 02/19/2020 02/12/2020  Glucose 70 - 99 mg/dL 93 96 106(H)  BUN 8 - 23 mg/dL '15 18 18  ' Creatinine 0.61 - 1.24 mg/dL 1.51(H) 1.63(H) 1.55(H)  Sodium 135 - 145 mmol/L 140 140 141  Potassium 3.5 - 5.1 mmol/L 4.1 4.1 3.6  Chloride 98 - 111 mmol/L 111 111 111  CO2 22 - 32 mmol/L '25 22 24   ' Calcium 8.9 - 10.3 mg/dL 8.6(L) 9.0 8.7(L)  Total Protein 6.5 - 8.1 g/dL 6.5 6.9 6.5  Total Bilirubin 0.3 - 1.2 mg/dL 0.4 0.7 0.6  Alkaline Phos 38 - 126 U/L 100 111 118  AST 15 - 41 U/L 13(L) 13(L) 13(L)  ALT 0 - 44 U/L '15 20 23   ' 06/20/2019 Kidney Biopsy:    12/31/16 Flow Cytometry:    12/15/16 CLL FISH:    RADIOGRAPHIC STUDIES: I have personally reviewed the radiological images as listed and agreed with the findings in the report. No results found.  ASSESSMENT & PLAN:  69 y.o. male with  1. Chronic Lymphocytic Leukemia - monoalleilic 83G deletion. Was started on Ibrutinib from 01/2017 due to fatigue, weight loss and significant splenomegaly.  2. H/o Grade 1 intermittent rash ?related to Ibrutinib - no currently an issue.  PLAN:   -Discussed pt labwork today, 04/08/20; blood counts look good, blood chemistries look steady, LDH is WNL, Vitamin D is low nml; Vitamin B12, Ferritin, & Sat Ratios look good.  -No lab or clinical evidence of CLL progression at this time. No indication for treatment. Will continue watchful observation.  -Recommend pt monitor BP. Advised pt that he may not require as much Amlodipine after stopping Ibrutinib - f/u with PCP. -Recommended that the pt continue to eat well, drink at least 48-64 oz of water each day, and walk 20-30 minutes each day.  -Advised pt that it is unlikely that the fatigue/weakness is caused by CLL. -Dizziness could be caused by medication or arrhythmia? -- further evaluation with pcp -Will see back in 3 months with labs   3.  Patient Active Problem List   Diagnosis Date Noted  . Counseling regarding advance care planning and goals of care 01/08/2020  . Skin rash 10/03/2017  . CLL (chronic lymphocytic leukemia) ([place]) 12/09/2016  .  General weakness 12/07/2016  . Smoker 12/07/2016  . Loss of weight 12/07/2016  . GERD (gastroesophageal reflux disease) 12/30/2010  . Constipation 12/30/2010  . Hemorrhoids, external 12/30/2010     FOLLOW UP: RTC with Dr Irene Limbo with labs in 3 months Plz cancel labs and MD visit currently scheduled for 05/21/2020   The total time spent in the appt was 20 minutes and more than 50% was on counseling and direct patient cares.  All of the patient's questions were answered with apparent satisfaction. The patient knows to call the clinic with any problems, questions or concerns.   Sullivan Lone MD [place] [facility] Hematology/Oncology Physician [facility]  (Office):       [phone] (Work cell):  [phone] (Fax):           [phone]  04/08/2020 1:29 PM  I, Yevette Edwards, am acting as a scribe for Dr. Sullivan Lone.   .I have reviewed the above documentation for accuracy and completeness, and I agree with the above. Brunetta Genera MD

## 2020-04-09 ENCOUNTER — Encounter: Payer: Self-pay | Admitting: Gastroenterology

## 2020-04-09 DIAGNOSIS — Z86010 Personal history of colonic polyps: Secondary | ICD-10-CM | POA: Insufficient documentation

## 2020-04-09 DIAGNOSIS — K2980 Duodenitis without bleeding: Secondary | ICD-10-CM | POA: Insufficient documentation

## 2020-04-09 DIAGNOSIS — R140 Abdominal distension (gaseous): Secondary | ICD-10-CM | POA: Insufficient documentation

## 2020-04-10 ENCOUNTER — Telehealth: Payer: Self-pay | Admitting: *Deleted

## 2020-04-10 ENCOUNTER — Other Ambulatory Visit: Payer: Self-pay

## 2020-04-10 ENCOUNTER — Ambulatory Visit (HOSPITAL_COMMUNITY)
Admission: RE | Admit: 2020-04-10 | Discharge: 2020-04-10 | Disposition: A | Discharge status: Home / Self Care | Payer: PPO | Source: Ambulatory Visit | Attending: Gastroenterology | Admitting: Gastroenterology

## 2020-04-10 DIAGNOSIS — R140 Abdominal distension (gaseous): Secondary | ICD-10-CM | POA: Diagnosis not present

## 2020-04-10 DIAGNOSIS — R161 Splenomegaly, not elsewhere classified: Secondary | ICD-10-CM | POA: Diagnosis not present

## 2020-04-10 NOTE — Telephone Encounter (Signed)
Schedule AWV.  

## 2020-05-20 ENCOUNTER — Encounter: Payer: Self-pay | Admitting: Gastroenterology

## 2020-05-21 ENCOUNTER — Ambulatory Visit: Payer: PPO | Admitting: Hematology

## 2020-05-21 ENCOUNTER — Other Ambulatory Visit: Payer: PPO

## 2020-05-30 ENCOUNTER — Encounter: Payer: Self-pay | Admitting: Gastroenterology

## 2020-05-30 ENCOUNTER — Other Ambulatory Visit: Payer: Self-pay

## 2020-05-30 ENCOUNTER — Ambulatory Visit (AMBULATORY_SURGERY_CENTER): Payer: PPO | Admitting: Gastroenterology

## 2020-05-30 VITALS — BP 146/72 | HR 54 | Temp 97.5°F | Resp 18 | Ht 72.0 in | Wt 188.0 lb

## 2020-05-30 DIAGNOSIS — R110 Nausea: Secondary | ICD-10-CM | POA: Diagnosis not present

## 2020-05-30 DIAGNOSIS — K2950 Unspecified chronic gastritis without bleeding: Secondary | ICD-10-CM | POA: Diagnosis not present

## 2020-05-30 DIAGNOSIS — D124 Benign neoplasm of descending colon: Secondary | ICD-10-CM | POA: Diagnosis not present

## 2020-05-30 DIAGNOSIS — K5909 Other constipation: Secondary | ICD-10-CM | POA: Diagnosis not present

## 2020-05-30 DIAGNOSIS — K2100 Gastro-esophageal reflux disease with esophagitis, without bleeding: Secondary | ICD-10-CM | POA: Diagnosis not present

## 2020-05-30 DIAGNOSIS — D125 Benign neoplasm of sigmoid colon: Secondary | ICD-10-CM | POA: Diagnosis not present

## 2020-05-30 DIAGNOSIS — K2980 Duodenitis without bleeding: Secondary | ICD-10-CM | POA: Diagnosis not present

## 2020-05-30 DIAGNOSIS — D120 Benign neoplasm of cecum: Secondary | ICD-10-CM | POA: Diagnosis not present

## 2020-05-30 DIAGNOSIS — D123 Benign neoplasm of transverse colon: Secondary | ICD-10-CM

## 2020-05-30 DIAGNOSIS — K2970 Gastritis, unspecified, without bleeding: Secondary | ICD-10-CM | POA: Diagnosis not present

## 2020-05-30 DIAGNOSIS — K449 Diaphragmatic hernia without obstruction or gangrene: Secondary | ICD-10-CM | POA: Diagnosis not present

## 2020-05-30 DIAGNOSIS — K259 Gastric ulcer, unspecified as acute or chronic, without hemorrhage or perforation: Secondary | ICD-10-CM | POA: Diagnosis not present

## 2020-05-30 DIAGNOSIS — K319 Disease of stomach and duodenum, unspecified: Secondary | ICD-10-CM

## 2020-05-30 DIAGNOSIS — D128 Benign neoplasm of rectum: Secondary | ICD-10-CM

## 2020-05-30 DIAGNOSIS — D122 Benign neoplasm of ascending colon: Secondary | ICD-10-CM

## 2020-05-30 DIAGNOSIS — R140 Abdominal distension (gaseous): Secondary | ICD-10-CM | POA: Diagnosis not present

## 2020-05-30 DIAGNOSIS — K209 Esophagitis, unspecified without bleeding: Secondary | ICD-10-CM | POA: Diagnosis not present

## 2020-05-30 DIAGNOSIS — Z86010 Personal history of colonic polyps: Secondary | ICD-10-CM | POA: Diagnosis not present

## 2020-05-30 DIAGNOSIS — K2289 Other specified disease of esophagus: Secondary | ICD-10-CM | POA: Diagnosis not present

## 2020-05-30 HISTORY — PX: COLONOSCOPY: SHX174

## 2020-05-30 HISTORY — PX: UPPER GASTROINTESTINAL ENDOSCOPY: SHX188

## 2020-05-30 MED ORDER — OMEPRAZOLE 40 MG PO CPDR: 40.0000 mg | DELAYED_RELEASE_CAPSULE | Freq: Two times a day (BID) | ORAL | 3 refills | Status: DC

## 2020-05-30 MED ORDER — SODIUM CHLORIDE 0.9 % IV SOLN: 500.0000 mL | Freq: Once | INTRAVENOUS | Status: DC

## 2020-05-30 NOTE — Op Note (Signed)
[facility] Patient Name: Wesley Bright Procedure Date: 05/30/2020 2:19 PM MRN: [id number] Endoscopist: Justice Britain , MD Age: 70 Referring MD:  Date of Birth: [birth year] Gender: Male Account #: [id number] Procedure:                Colonoscopy Indications:              High risk colon cancer surveillance: Personal                            history of colonic polyps, Surveillance: Personal                            history of adenomatous polyps on last colonoscopy >                            5 years ago, Incidental - Constipation, Bloating Medicines:                Monitored Anesthesia Care Procedure:                Pre-Anesthesia Assessment:                           - Prior to the procedure, a History and Physical                            was performed, and patient medications and                            allergies were reviewed. The patient's tolerance of                            previous anesthesia was also reviewed. The risks                            and benefits of the procedure and the sedation                            options and risks were discussed with the patient.                            All questions were answered, and informed consent                            was obtained. Prior Anticoagulants: The patient has                            taken no previous anticoagulant or antiplatelet                            agents. ASA Grade Assessment: II - A patient with                            mild systemic disease. After reviewing the risks  and benefits, the patient was deemed in                            satisfactory condition to undergo the procedure.                           After obtaining informed consent, the colonoscope                            was passed under direct vision. Throughout the                            procedure, the patient's blood pressure, pulse, and                            oxygen  saturations were monitored continuously. The                            Colonoscope was introduced through the anus and                            advanced to the 5 cm into the ileum. The                            colonoscopy was performed without difficulty. The                            patient tolerated the procedure. The quality of the                            bowel preparation was fair. The terminal ileum,                            ileocecal valve, appendiceal orifice, and rectum                            were photographed. Scope In: 2:37:51 PM Scope Out: 3:22:25 PM Scope Withdrawal Time: 0 hours 40 minutes 15 seconds  Total Procedure Duration: 0 hours 44 minutes 34 seconds  Findings:                 The digital rectal exam findings include                            hemorrhoids. Pertinent negatives include no                            palpable rectal lesions.                           Extensive amounts of liquid semi-liquid stool was                            found in the entire colon, interfering with  visualization. Lavage of the area was performed                            using copious amounts, resulting in incomplete                            clearance with fair visualization.                           19 sessile polyps were found in the rectum, sigmoid                            colon, descending colon, transverse colon,                            ascending colon and cecum. The polyps were 2 to 13                            mm in size. These polyps were removed with a cold                            snare. Resection and retrieval were complete.                           Multiple small and large-mouthed diverticula were                            found in the recto-sigmoid colon and sigmoid colon.                           Non-bleeding non-thrombosed external and internal                            hemorrhoids were found during retroflexion,  during                            perianal exam and during digital exam. The                            hemorrhoids were Grade II (internal hemorrhoids                            that prolapse but reduce spontaneously). Complications:            No immediate complications. Estimated Blood Loss:     Estimated blood loss was minimal. Impression:               - Preparation of the colon was fair.                           - Hemorrhoids found on digital rectal exam.                           - Stool in the entire examined colon.                           -  19 2 to 13 mm polyps in the rectum, in the                            sigmoid colon, in the descending colon, in the                            transverse colon, in the ascending colon and in the                            cecum, removed with a cold snare. Resected and                            retrieved.                           - Diverticulosis in the recto-sigmoid colon and in                            the sigmoid colon.                           - Non-bleeding non-thrombosed external and internal                            hemorrhoids. Recommendation:           - The patient will be observed post-procedure,                            until all discharge criteria are met.                           - Discharge patient to home.                           - Patient has a contact number available for                            emergencies. The signs and symptoms of potential                            delayed complications were discussed with the                            patient. Return to normal activities tomorrow.                            Written discharge instructions were provided to the                            patient.                           - High fiber diet.                           -  Consider continuing Linzess 145 mcg vs increasing                            to 290 mcg based on how he feels things worked vs                             did not work.                           - Continue present medications.                           - Await pathology results.                           - Repeat colonoscopy within next 6-12 months                            because the bowel preparation was suboptimal (2-day                            preparation and 1 week of Miralax twice daily). Can                            consider doing this procedure in addition to the                            follow up EGD at same time if patient agrees.                           - The findings and recommendations were discussed                            with the patient.                           - The findings and recommendations were discussed                            with the patient's family. Justice Britain, MD 05/30/2020 3:43:07 PM

## 2020-05-30 NOTE — Progress Notes (Signed)
Called to room to assist during endoscopic procedure.  Patient ID and intended procedure confirmed with present staff. Received instructions for my participation in the procedure from the performing physician.  

## 2020-05-30 NOTE — Progress Notes (Signed)
To PACU, VSS. Report to [email or web address] 

## 2020-05-30 NOTE — Patient Instructions (Signed)
Please read handouts provided. Continue present medications. Await pathology results. High Fiber Diet. Start Omeprazole 40 mg twice daily. Repeat EGD in 3 months to check on healing. Repeat colonoscopy in next 6-12 months because bowel prep was suboptimal.        YOU HAD AN ENDOSCOPIC PROCEDURE TODAY AT [facility]:   Refer to the procedure report that was given to you for any specific questions about what was found during the examination.  If the procedure report does not answer your questions, please call your gastroenterologist to clarify.  If you requested that your care partner not be given the details of your procedure findings, then the procedure report has been included in a sealed envelope for you to review at your convenience later.  YOU SHOULD EXPECT: Some feelings of bloating in the abdomen. Passage of more gas than usual.  Walking can help get rid of the air that was put into your GI tract during the procedure and reduce the bloating. If you had a lower endoscopy (such as a colonoscopy or flexible sigmoidoscopy) you may notice spotting of blood in your stool or on the toilet paper. If you underwent a bowel prep for your procedure, you may not have a normal bowel movement for a few days.  Please Note:  You might notice some irritation and congestion in your nose or some drainage.  This is from the oxygen used during your procedure.  There is no need for concern and it should clear up in a day or so.  SYMPTOMS TO REPORT IMMEDIATELY:   Following lower endoscopy (colonoscopy or flexible sigmoidoscopy):  Excessive amounts of blood in the stool  Significant tenderness or worsening of abdominal pains  Swelling of the abdomen that is new, acute  Fever of 100F or higher   Following upper endoscopy (EGD)  Vomiting of blood or coffee ground material  New chest pain or pain under the shoulder blades  Painful or persistently difficult swallowing  New shortness of  breath  Fever of 100F or higher  Black, tarry-looking stools  For urgent or emergent issues, a gastroenterologist can be reached at any hour by calling [phone]. Do not use MyChart messaging for urgent concerns.    DIET:  We do recommend a small meal at first, but then you may proceed to your regular diet.  Drink plenty of fluids but you should avoid alcoholic beverages for 24 hours.  ACTIVITY:  You should plan to take it easy for the rest of today and you should NOT DRIVE or use heavy machinery until tomorrow (because of the sedation medicines used during the test).    FOLLOW UP: Our staff will call the number listed on your records 48-72 hours following your procedure to check on you and address any questions or concerns that you may have regarding the information given to you following your procedure. If we do not reach you, we will leave a message.  We will attempt to reach you two times.  During this call, we will ask if you have developed any symptoms of COVID 19. If you develop any symptoms (ie: fever, flu-like symptoms, shortness of breath, cough etc.) before then, please call [phone].  If you test positive for Covid 19 in the 2 weeks post procedure, please call and report this information to [place].    If any biopsies were taken you will be contacted by phone or by letter within the next 1-3 weeks.  Please call us at [phone]  [phone] if you have not heard about the biopsies in 3 weeks.    SIGNATURES/CONFIDENTIALITY: You and/or your care partner have signed paperwork which will be entered into your electronic medical record.  These signatures attest to the fact that that the information above on your After Visit Summary has been reviewed and is understood.  Full responsibility of the confidentiality of this discharge information lies with you and/or your care-partner.

## 2020-05-30 NOTE — Op Note (Signed)
[facility] Patient Name: Wesley Bright Procedure Date: 05/30/2020 2:19 PM MRN: [id number] Endoscopist: Justice Britain , MD Age: 70 Referring MD:  Date of Birth: [birth year] Gender: Male Account #: [id number] Procedure:                Upper GI endoscopy Indications:              Generalized abdominal distress, Dysphagia,                            Suspected gastro-esophageal reflux disease,                            Abdominal bloating Medicines:                Monitored Anesthesia Care Procedure:                Pre-Anesthesia Assessment:                           - Prior to the procedure, a History and Physical                            was performed, and patient medications and                            allergies were reviewed. The patient's tolerance of                            previous anesthesia was also reviewed. The risks                            and benefits of the procedure and the sedation                            options and risks were discussed with the patient.                            All questions were answered, and informed consent                            was obtained. Prior Anticoagulants: The patient has                            taken no previous anticoagulant or antiplatelet                            agents except for aspirin. ASA Grade Assessment: II                            - A patient with mild systemic disease. After                            reviewing the risks and benefits, the patient was  deemed in satisfactory condition to undergo the                            procedure.                           After obtaining informed consent, the endoscope was                            passed under direct vision. Throughout the                            procedure, the patient's blood pressure, pulse, and                            oxygen saturations were monitored continuously. The                             Endoscope was introduced through the mouth, and                            advanced to the second part of duodenum. The                            patient tolerated the procedure. Scope In: Scope Out: Findings:                 White nummular lesions were noted in the proximal                            esophagus and in the mid esophagus. Biopsies were                            taken with a cold forceps for histology to rule out                            Candida.                           Scattered islands of salmon-colored mucosa were                            present at 39 cm. No other visible abnormalities                            were present. Biopsies were taken with a cold                            forceps for histology.                           LA Grade B (one or more mucosal breaks greater than                            5 mm, not extending between the tops  of two mucosal                            folds) esophagitis with no bleeding was found in                            the distal esophagus/GE Junction.                           The Z-line was regular and was found 40 cm from the                            incisors.                           A 3 cm hiatal hernia was present.                           Scattered moderate inflammation characterized by                            erosions, erythema, friability and shallow ulcers                            were found in the gastric body, at the incisura and                            in the gastric antrum (ulcers in the antrum and                            prepylorus).                           No other gross lesions were noted in the entire                            examined stomach. Biopsies were taken with a cold                            forceps for histology and Helicobacter pylori                            testing.                           Patchy mildly erythematous mucosa without active                             bleeding and with no stigmata of bleeding was found                            in the duodenal bulb, in the first portion of the                            duodenum and in the  second portion of the duodenum.                            Biopsies were taken with a cold forceps for                            histology. Complications:            No immediate complications. Estimated Blood Loss:     Estimated blood loss was minimal. Impression:               - White nummular lesions in esophageal mucosa.                            Biopsied.                           - Salmon-colored mucosa suspicious for Barrett's                            esophagus. Biopsied.                           - LA Grade B esophagitis with no bleeding at GE Jxn.                           - Z-line regular, 40 cm from the incisors.                           - 3 cm hiatal hernia.                           - Gastritis and gastric ulcers noted. No other                            gross lesions in the stomach. Biopsied.                           - Erythematous duodenopathy. Biopsied. Recommendation:           - Proceed to scheduled colonoscopy.                           - Start Omeprazole 40 mg twice daily.                           - Repeat EGD in 19-months to check on healing and                            also plan on dilation as long as healing of                            esophagitis is present.                           - Observe patient's clinical course.                           -  Await pathology results.                           - The findings and recommendations were discussed                            with the patient.                           - The findings and recommendations were discussed                            with the patient's family. Justice Britain, MD 05/30/2020 3:36:30 PM

## 2020-05-30 NOTE — Progress Notes (Deleted)
To PACU, VSS. Report to [email or web address].tb 

## 2020-06-04 ENCOUNTER — Telehealth: Payer: Self-pay

## 2020-06-04 NOTE — Telephone Encounter (Signed)
Attempted to reach pt. With follow-up call following endoscopic procedure 1/131/2022.  Ans. Machine said pt. "unavailable."  Unable to LM.  Will try to reach pt. Again later today.

## 2020-06-17 ENCOUNTER — Encounter: Payer: Self-pay | Admitting: Gastroenterology

## 2020-07-01 ENCOUNTER — Ambulatory Visit (INDEPENDENT_AMBULATORY_CARE_PROVIDER_SITE_OTHER): Payer: PPO | Admitting: Family Medicine

## 2020-07-01 ENCOUNTER — Other Ambulatory Visit: Payer: Self-pay

## 2020-07-01 ENCOUNTER — Encounter: Payer: Self-pay | Admitting: Family Medicine

## 2020-07-01 VITALS — BP 132/80 | HR 69 | Temp 97.4°F | Ht 72.0 in | Wt 188.0 lb

## 2020-07-01 DIAGNOSIS — F17200 Nicotine dependence, unspecified, uncomplicated: Secondary | ICD-10-CM | POA: Diagnosis not present

## 2020-07-01 DIAGNOSIS — Z289 Immunization not carried out for unspecified reason: Secondary | ICD-10-CM | POA: Diagnosis not present

## 2020-07-01 DIAGNOSIS — Z122 Encounter for screening for malignant neoplasm of respiratory organs: Secondary | ICD-10-CM | POA: Diagnosis not present

## 2020-07-01 DIAGNOSIS — Z1159 Encounter for screening for other viral diseases: Secondary | ICD-10-CM | POA: Diagnosis not present

## 2020-07-01 DIAGNOSIS — N1831 Chronic kidney disease, stage 3a: Secondary | ICD-10-CM | POA: Diagnosis not present

## 2020-07-01 DIAGNOSIS — C9110 Chronic lymphocytic leukemia of B-cell type not having achieved remission: Secondary | ICD-10-CM | POA: Diagnosis not present

## 2020-07-01 NOTE — Patient Instructions (Addendum)
To do: Need tetanus and pneumonia vaccine   Health Maintenance After Age 69 After age 34, you are at a higher risk for certain long-term diseases and infections as well as injuries from falls. Falls are a major cause of broken bones and head injuries in people who are older than age 6. Getting regular preventive care can help to keep you healthy and well. Preventive care includes getting regular testing and making lifestyle changes as recommended by your health care provider. Talk with your health care provider about:  Which screenings and tests you should have. A screening is a test that checks for a disease when you have no symptoms.  A diet and exercise plan that is right for you. What should I know about screenings and tests to prevent falls? Screening and testing are the best ways to find a health problem early. Early diagnosis and treatment give you the best chance of managing medical conditions that are common after age 36. Certain conditions and lifestyle choices may make you more likely to have a fall. Your health care provider may recommend:  Regular vision checks. Poor vision and conditions such as cataracts can make you more likely to have a fall. If you wear glasses, make sure to get your prescription updated if your vision changes.  Medicine review. Work with your health care provider to regularly review all of the medicines you are taking, including over-the-counter medicines. Ask your health care provider about any side effects that may make you more likely to have a fall. Tell your health care provider if any medicines that you take make you feel dizzy or sleepy.  Osteoporosis screening. Osteoporosis is a condition that causes the bones to get weaker. This can make the bones weak and cause them to break more easily.  Blood pressure screening. Blood pressure changes and medicines to control blood pressure can make you feel dizzy.  Strength and balance checks. Your health care  provider may recommend certain tests to check your strength and balance while standing, walking, or changing positions.  Foot health exam. Foot pain and numbness, as well as not wearing proper footwear, can make you more likely to have a fall.  Depression screening. You may be more likely to have a fall if you have a fear of falling, feel emotionally low, or feel unable to do activities that you used to do.  Alcohol use screening. Using too much alcohol can affect your balance and may make you more likely to have a fall. What actions can I take to lower my risk of falls? General instructions  Talk with your health care provider about your risks for falling. Tell your health care provider if: ? You fall. Be sure to tell your health care provider about all falls, even ones that seem minor. ? You feel dizzy, sleepy, or off-balance.  Take over-the-counter and prescription medicines only as told by your health care provider. These include any supplements.  Eat a healthy diet and maintain a healthy weight. A healthy diet includes low-fat dairy products, low-fat (lean) meats, and fiber from whole grains, beans, and lots of fruits and vegetables. Home safety  Remove any tripping hazards, such as rugs, cords, and clutter.  Install safety equipment such as grab bars in bathrooms and safety rails on stairs.  Keep rooms and walkways well-lit. Activity  Follow a regular exercise program to stay fit. This will help you maintain your balance. Ask your health care provider what types of exercise are appropriate for  you.  If you need a cane or walker, use it as recommended by your health care provider.  Wear supportive shoes that have nonskid soles.   Lifestyle  Do not drink alcohol if your health care provider tells you not to drink.  If you drink alcohol, limit how much you have: ? 0-1 drink a day for women. ? 0-2 drinks a day for men.  Be aware of how much alcohol is in your drink. In the  [place] one drink equals one typical bottle of beer (12 oz), one-half glass of wine (5 oz), or one shot of hard liquor (1 oz).  Do not use any products that contain nicotine or tobacco, such as cigarettes and e-cigarettes. If you need help quitting, ask your health care provider. Summary  Having a healthy lifestyle and getting preventive care can help to protect your health and wellness after age 14.  Screening and testing are the best way to find a health problem early and help you avoid having a fall. Early diagnosis and treatment give you the best chance for managing medical conditions that are more common for people who are older than age 8.  Falls are a major cause of broken bones and head injuries in people who are older than age 78. Take precautions to prevent a fall at home.  Work with your health care provider to learn what changes you can make to improve your health and wellness and to prevent falls. This information is not intended to replace advice given to you by your health care provider. Make sure you discuss any questions you have with your health care provider. Document Revised: 08/25/2018 Document Reviewed: 03/17/2017 Elsevier Patient Education  2021 [employer].   If you have lab work done today you will be contacted with your lab results within the next 2 weeks.  If you have not heard from [place] then please contact us. The fastest way to get your results is to register for My Chart.   IF you received an x-ray today, you will receive an invoice from [facility]. Please contact [facility] at [phone] with questions or concerns regarding your invoice.   IF you received labwork today, you will receive an invoice from [facility]. Please contact [facility] at [phone] with questions or concerns regarding your invoice.   Our billing staff will not be able to assist you with questions regarding bills from these companies.  You will be contacted with the lab  results as soon as they are available. The fastest way to get your results is to activate your My Chart account. Instructions are located on the last page of this paperwork. If you have not heard from [place] regarding the results in 2 weeks, please contact this office.

## 2020-07-01 NOTE — Progress Notes (Signed)
2/14/202212:33 PM  Wesley Bright 07/10/1950, 70 y.o., male [id number]  Chief Complaint  Patient presents with  . fall follow up     HPI:   Patient is a 70 y.o. male with past medical history significant for CLL and CKD 3b who presents today for routine follow up.  Feeling much better today No longer having issues with falls Denies any acute issues today  Recently seen by [facility]: found 19 polyps during colonoscopy Needs to go back for a repeat colonoscopy Chronic constipation and bloating managed by GI Daily Omeprazole 55m  Smoking: 50 years, still smokes 4 pack a week now, was very heavy smoker in the past  CKD: nephrololgy  CLL No longer on treatment  Hypertension SBP at home 130s Amlodipine 538m BP Readings from Last 3 Encounters:  07/01/20 132/80  05/30/20 (!) 146/72  04/04/20 (!) 158/92   Health Maintenance  Topic Date Due  . TETANUS/TDAP  Never done  . PNA vac Low Risk Adult (1 of 2 - PCV13) Never done  . INFLUENZA VACCINE  08/15/2020 (Originally 12/17/2019)  . COVID-19 Vaccine (4 - Booster for [place]) 07/15/2020  . COLONOSCOPY (Pts [id number] coverage will need to be confirmed)  05/30/2021  . Hepatitis C Screening  Completed   Discussed vaccinations: declines at this time Is not sure if has had chicken pox as a child  [facility] 07/01/2020 03/05/2020 12/07/2016  Decreased Interest 0 0 0  Down, Depressed, Hopeless 0 0 0  PHQ - 2 Score 0 0 0    Fall Risk  03/05/2020 12/07/2016  Falls in the past year? 1 No  Number falls in past yr: 0 -  Injury with Fall? 1 -  Follow up Falls evaluation completed -     No Known Allergies  Prior to Admission medications   Medication Sig Start Date End Date Taking? Authorizing Provider  amLODipine (NORVASC) 5 MG tablet Take 5 mg by mouth daily.   Yes [provider]  cholecalciferol (VITAMIN D) 1000 units tablet Take 1,000 Units by mouth daily.   Yes [provider]   co-enzyme Q-10 30 MG capsule Take 30 mg by mouth 3 (three) times daily.   Yes [provider]  Ibrutinib (IMBRUVICA) 420 MG TABS Take 420 mg by mouth daily. Take with a glass of water at approximately the same time each day. 03/16/19  Yes KalBrunetta GeneraD  tamsulosin (FLOMAX) 0.4 MG CAPS capsule Take 0.4 mg by mouth daily. 06/13/19  Yes [provider]  vitamin B-12 (CYANOCOBALAMIN) 100 MCG tablet Take 100 mcg by mouth daily.   Yes [provider]    Past Medical History:  Diagnosis Date  . External hemorrhoid   . Hypertension   . Leukemia ([facility]    Past Surgical History:  Procedure Laterality Date  . APPENDECTOMY    . INGUINAL HERNIA REPAIR     left  . KNEE SURGERY     Left Knee    Social History   Tobacco Use  . Smoking status: Current Every Day Smoker    Packs/day: 0.50    Years: 50.00    Pack years: 25.00  . Smokeless tobacco: Never Used  Substance Use Topics  . Alcohol use: Yes    Comment: occasional    Family History  Problem Relation Age of Onset  . Liver cancer Brother   . Cancer Brother        Liver  . Esophageal cancer  Father   . Cancer Father        Throat   . Cancer Mother        Bladder - cause of death  . Pancreatic cancer Maternal Uncle   . Pancreatic cancer Paternal Uncle   . Stomach cancer Maternal Uncle   . Colon cancer Neg Hx   . Inflammatory bowel disease Neg Hx   . Rectal cancer Neg Hx     Review of Systems  Constitutional: Negative for chills, fever, malaise/fatigue and weight loss.  HENT: Negative for hearing loss.   Eyes: Negative for blurred vision and double vision.  Respiratory: Negative for cough, shortness of breath and wheezing.   Cardiovascular: Negative for chest pain, palpitations and leg swelling.  Gastrointestinal: Positive for constipation and heartburn. Negative for abdominal pain, blood in stool, diarrhea, nausea and vomiting.  Musculoskeletal: Negative for back pain and joint  pain.  Skin: Negative for rash.  Neurological: Negative for dizziness, tingling, weakness and headaches.     OBJECTIVE:  Today's Vitals   07/01/20 0827  BP: 132/80  Pulse: 69  Temp: (!) 97.4 F (36.3 C)  TempSrc: Oral  SpO2: 97%  Weight: 188 lb (85.3 kg)  Height: 6' (1.829 m)   Body mass index is 25.5 kg/m.   Physical Exam Vitals reviewed.  Constitutional:      Appearance: Normal appearance.  HENT:     Head: Normocephalic and atraumatic.  Eyes:     Pupils: Pupils are equal, round, and reactive to light.  Cardiovascular:     Rate and Rhythm: Normal rate and regular rhythm.     Pulses: Normal pulses.     Heart sounds: Normal heart sounds. No murmur heard. No friction rub. No gallop.   Pulmonary:     Effort: Pulmonary effort is normal. No respiratory distress.     Breath sounds: Normal breath sounds. No stridor. No wheezing or rales.  Abdominal:     General: Bowel sounds are normal. There is no distension.     Palpations: Abdomen is soft.     Tenderness: There is no abdominal tenderness.  Musculoskeletal:     Right lower leg: No edema.     Left lower leg: No edema.  Skin:    General: Skin is warm and dry.  Neurological:     General: No focal deficit present.     Mental Status: He is alert and oriented to person, place, and time. Mental status is at baseline.  Psychiatric:        Mood and Affect: Mood normal.        Behavior: Behavior normal.        Thought Content: Thought content normal.     No results found for this or any previous visit (from the past 24 hour(s)).  No results found.   ASSESSMENT and PLAN     Problem List Items Addressed This Visit      Other   CLL (chronic lymphocytic leukemia) (HCC)   Relevant Orders   CBC with Differential    Other Visit Diagnoses    Encounter for screening for malignant neoplasm of lung in current smoker with 30 pack year history or greater    -  Primary   Relevant Orders   CT CHEST LUNG CA SCREEN LOW  DOSE W/O CM   Stage 3a chronic kidney disease (HCC)       Relevant Orders   CMP14+EGFR   Lipid Panel   TSH   Encounter for hepatitis C  screening test for low risk patient       Relevant Orders   Hepatitis C antibody   Delayed vaccination       Relevant Orders   Varicella zoster antibody, IgG      Will follow up with Varicella titer for potential shingrix admin Needs Tetanus and PSV vac. Declines at this time, r/se/b discussed Will follow up with lab results Chronic conditions stable at this time BP at goal< 130/80 on amlodipine 5 mg Omeprazole managed by GI, needs repeat colonoscopy   Return in about 3 months (around 09/28/2020).    Huston Foley Kemari Mares, FNP-BC [facility] at [facility] [employer], [place] [address] Ph.  [phone] Fax [phone]

## 2020-07-02 ENCOUNTER — Other Ambulatory Visit: Payer: Self-pay | Admitting: Family Medicine

## 2020-07-02 ENCOUNTER — Telehealth: Payer: Self-pay | Admitting: Hematology

## 2020-07-02 DIAGNOSIS — E7800 Pure hypercholesterolemia, unspecified: Secondary | ICD-10-CM

## 2020-07-02 LAB — CBC WITH DIFFERENTIAL/PLATELET
Basophils Absolute: 0.1 10*3/uL (ref 0.0–0.2)
Basos: 1 %
EOS (ABSOLUTE): 0.2 10*3/uL (ref 0.0–0.4)
Eos: 3 %
Hematocrit: 46.3 % (ref 37.5–51.0)
Hemoglobin: 15.9 g/dL (ref 13.0–17.7)
Immature Grans (Abs): 0 10*3/uL (ref 0.0–0.1)
Immature Granulocytes: 0 %
Lymphocytes Absolute: 1.1 10*3/uL (ref 0.7–3.1)
Lymphs: 17 %
MCH: 29.6 pg (ref 26.6–33.0)
MCHC: 34.3 g/dL (ref 31.5–35.7)
MCV: 86 fL (ref 79–97)
Monocytes Absolute: 0.5 10*3/uL (ref 0.1–0.9)
Monocytes: 7 %
Neutrophils Absolute: 4.5 10*3/uL (ref 1.4–7.0)
Neutrophils: 72 %
Platelets: 170 10*3/uL (ref 150–450)
RBC: 5.38 x10E6/uL (ref 4.14–5.80)
RDW: 12.7 % (ref 11.6–15.4)
WBC: 6.3 10*3/uL (ref 3.4–10.8)

## 2020-07-02 LAB — CMP14+EGFR
ALT: 25 IU/L (ref 0–44)
AST: 17 IU/L (ref 0–40)
Albumin/Globulin Ratio: 1.8 (ref 1.2–2.2)
Albumin: 4.2 g/dL (ref 3.8–4.8)
Alkaline Phosphatase: 123 IU/L — ABNORMAL HIGH (ref 44–121)
BUN/Creatinine Ratio: 9 — ABNORMAL LOW (ref 10–24)
BUN: 14 mg/dL (ref 8–27)
Bilirubin Total: 0.4 mg/dL (ref 0.0–1.2)
CO2: 18 mmol/L — ABNORMAL LOW (ref 20–29)
Calcium: 9.2 mg/dL (ref 8.6–10.2)
Chloride: 105 mmol/L (ref 96–106)
Creatinine, Ser: 1.54 mg/dL — ABNORMAL HIGH (ref 0.76–1.27)
GFR calc Af Amer: 53 mL/min/{1.73_m2} — ABNORMAL LOW (ref >59)
GFR calc non Af Amer: 46 mL/min/{1.73_m2} — ABNORMAL LOW (ref >59)
Globulin, Total: 2.3 g/dL (ref 1.5–4.5)
Glucose: 97 mg/dL (ref 65–99)
Potassium: 4 mmol/L (ref 3.5–5.2)
Sodium: 139 mmol/L (ref 134–144)
Total Protein: 6.5 g/dL (ref 6.0–8.5)

## 2020-07-02 LAB — HEPATITIS C ANTIBODY: Hep C Virus Ab: <0.1 s/co ratio (ref 0.0–0.9)

## 2020-07-02 LAB — LIPID PANEL
Chol/HDL Ratio: 4.8 ratio (ref 0.0–5.0)
Cholesterol, Total: 208 mg/dL — ABNORMAL HIGH (ref 100–199)
HDL: 43 mg/dL (ref >39)
LDL Chol Calc (NIH): 141 mg/dL — ABNORMAL HIGH (ref 0–99)
Triglycerides: 131 mg/dL (ref 0–149)
VLDL Cholesterol Cal: 24 mg/dL (ref 5–40)

## 2020-07-02 LAB — TSH: TSH: 0.514 u[IU]/mL (ref 0.450–4.500)

## 2020-07-02 LAB — VARICELLA ZOSTER ANTIBODY, IGG: Varicella zoster IgG: 1416 index (ref >165)

## 2020-07-02 MED ORDER — ROSUVASTATIN CALCIUM 10 MG PO TABS: 10.0000 mg | ORAL_TABLET | Freq: Every day | ORAL | 3 refills | Status: DC

## 2020-07-02 NOTE — Telephone Encounter (Signed)
Called patient regarding upcoming 02/21 appointment, patient is notified.

## 2020-07-07 NOTE — Progress Notes (Signed)
HEMATOLOGY/ONCOLOGY CLINIC NOTE  Date of Service: 07/07/2020  Patient Care Team: Just, Laurita Quint, FNP as PCP - General (Family Medicine)  CHIEF COMPLAINTS/PURPOSE OF CONSULTATION:  F/u for Chronic Lymphocytic Leukemia   Oncologic History:  [name] with CLL after a CLL FISH study revealed heterozygous 13q deletion within 92% cells. Subsequent imaging showed diffuse non-bulky lymphadenopathy in chest, abdomen, and pelvis with splenomegaly of 1200 cm^3. The pt began daily Ibrutinib 420mg  on 01/27/17 after becoming increasing fatigued and lost 15 pounds of weight over 4-5 months.   HISTORY OF PRESENTING ILLNESS:   Wesley Bright is a wonderful 70 y.o. male who has been previously seen by my colleague Dr Grace Isaac for evaluation and management of Chronic Lymphocytic Leukemia. He is accompanied today by his daughter and wife. The pt reports that he is doing well overall.   The pt reports that he is feeling better and his energy levels have been improving. He has continued to take Vitamin D, Vitamin B12 and co-enzyme Q. He notes no problems taking his Ibrutinib besides mild rashes that come and go without itching or bothering him very much.   He notes that he has had problems in the past with fluid in the abdomen, and has taken medication for this. He has also intermittently had constipation.   Most recent lab results (11/03/17) of CBC  is as follows: all values are WNL except for WBC at 30.3k, HGB at 12.3, HCT at 37.9, RDW at 15.0, PLT at 109k, Lymphs abs at 26.8k, Creatinine at 1.62, Total Protein at 6.1, Total bilirubin at <0.2, GFR at 43. LDH 6/19;19 is WNL at 164 Uric Acid 11/03/17 is WNL at 4.3  On review of systems, pt reports mild skin rashes, increasing energy levels, and denies mouth sores, fevers, chills, diarrhea, inguinal rashes, itching, vision changes, abdominal pains, leg swelling, and any other symptoms.   Interval History:   Wesley Bright  returns today for management and evaluation of his CLL. The patient's last visit with [place] was on 04/08/2020. The pt reports that he is doing well overall.  The pt reports no acute new symptoms. No fevers/chills/night sweats/weight loss. No new lumps or bumps. Energy levels have been good.  Patient notes his BP has been better controlled.  Lab results today 07/08/2020 of CBC w/diff and CMP reviewed with patient  On review of systems, pt reports no acute new new symptoms.  MEDICAL HISTORY:  Past Medical History:  Diagnosis Date  . External hemorrhoid   . Hypertension   . Leukemia ([place]) 2018    SURGICAL HISTORY: Past Surgical History:  Procedure Laterality Date  . APPENDECTOMY    . INGUINAL HERNIA REPAIR     left  . KNEE SURGERY     Left Knee    SOCIAL HISTORY: Social History   Socioeconomic History  . Marital status: Married    Spouse name: Not on file  . Number of children: 2  . Years of education: Not on file  . Highest education level: Not on file  Occupational History  . Occupation: Buyer, retail: [employer]  Tobacco Use  . Smoking status: Current Every Day Smoker    Packs/day: 0.50    Years: 50.00    Pack years: 25.00  . Smokeless tobacco: Never Used  Vaping Use  . Vaping Use: Never used  Substance and Sexual Activity  . Alcohol use: Yes    Comment: occasional  . Drug  use: No  . Sexual activity: Not on file  Other Topics Concern  . Not on file  Social History Narrative  . Not on file   Social Determinants of Health   Financial Resource Strain: Not on file  Food Insecurity: Not on file  Transportation Needs: Not on file  Physical Activity: Not on file  Stress: Not on file  Social Connections: Not on file  Intimate Partner Violence: Not on file    FAMILY HISTORY: Family History  Problem Relation Age of Onset  . Liver cancer Brother   . Cancer Brother        Liver  . Esophageal cancer Father   . Cancer Father         Throat   . Cancer Mother        Bladder - cause of death  . Pancreatic cancer Maternal Uncle   . Pancreatic cancer Paternal Uncle   . Stomach cancer Maternal Uncle   . Colon cancer Neg Hx   . Inflammatory bowel disease Neg Hx   . Rectal cancer Neg Hx     ALLERGIES:  has No Known Allergies.  MEDICATIONS:  Current Outpatient Medications  Medication Sig Dispense Refill  . amLODipine (NORVASC) 5 MG tablet Take 5 mg by mouth daily.    . cholecalciferol (VITAMIN D) 1000 units tablet Take 1,000 Units by mouth daily.    Marland Kitchen co-enzyme Q-10 30 MG capsule Take 30 mg by mouth 3 (three) times daily.    Marland Kitchen omeprazole (PRILOSEC) 40 MG capsule Take 1 capsule (40 mg total) by mouth in the morning and at bedtime. 60 capsule 3  . rosuvastatin (CRESTOR) 10 MG tablet Take 1 tablet (10 mg total) by mouth daily. 90 tablet 3  . vitamin B-12 (CYANOCOBALAMIN) 100 MCG tablet Take 100 mcg by mouth daily.     No current facility-administered medications for this visit.    REVIEW OF SYSTEMS:   10 Point review of Systems was done is negative except as noted above.  PHYSICAL EXAMINATION: ECOG PERFORMANCE STATUS: 1 - Symptomatic but completely ambulatory  Vitals:   07/08/20 1200  BP: 137/82  Pulse: 61  Resp: 20  Temp: (!) 97 F (36.1 C)  SpO2: 100%   Filed Weights   07/08/20 1200  Weight: 191 lb (86.6 kg)   .Body mass index is 25.9 kg/m.   NAD GENERAL:alert, in no acute distress and comfortable SKIN: no acute rashes, no significant lesions EYES: conjunctiva are pink and non-injected, sclera anicteric OROPHARYNX: MMM, no exudates, no oropharyngeal erythema or ulceration NECK: supple, no JVD LYMPH:  no palpable lymphadenopathy in the cervical, axillary or inguinal regions LUNGS: clear to auscultation b/l with normal respiratory effort HEART: regular rate & rhythm ABDOMEN:  normoactive bowel sounds , non tender, not distended. Extremity: no pedal edema PSYCH: alert & oriented x 3 with fluent  speech NEURO: no focal motor/sensory deficits  LABORATORY DATA:  I have reviewed the data as listed  . CBC Latest Ref Rng & Units 07/01/2020 04/04/2020 02/19/2020  WBC 3.4 - 10.8 x10E3/uL 6.3 5.5 6.8  Hemoglobin 13.0 - 17.7 g/dL 15.9 15.1 15.4  Hematocrit 37.5 - 51.0 % 46.3 46.2 47.1  Platelets 150 - 450 x10E3/uL 170 148(L) 133(L)    . CMP Latest Ref Rng & Units 07/08/2020 07/01/2020 04/04/2020  Glucose 70 - 99 mg/dL 88 97 93  BUN 8 - 23 mg/dL 16 14 15   Creatinine 0.61 - 1.24 mg/dL 1.54(H) 1.54(H) 1.51(H)  Sodium 135 - 145  mmol/L 139 139 140  Potassium 3.5 - 5.1 mmol/L 4.3 4.0 4.1  Chloride 98 - 111 mmol/L 110 105 111  CO2 22 - 32 mmol/L 23 18(L) 25  Calcium 8.9 - 10.3 mg/dL 8.6(L) 9.2 8.6(L)  Total Protein 6.5 - 8.1 g/dL 6.4(L) 6.5 6.5  Total Bilirubin 0.3 - 1.2 mg/dL 0.4 0.4 0.4  Alkaline Phos 38 - 126 U/L 100 123(H) 100  AST 15 - 41 U/L 22 17 13(L)  ALT 0 - 44 U/L 39 25 15   06/20/2019 Kidney Biopsy:    12/31/16 Flow Cytometry:    12/15/16 CLL FISH:    RADIOGRAPHIC STUDIES: I have personally reviewed the radiological images as listed and agreed with the findings in the report. No results found.  ASSESSMENT & PLAN:  70 y.o. male with  1. Chronic Lymphocytic Leukemia - monoalleilic 48O deletion. Was started on Ibrutinib from 01/2017 due to fatigue, weight loss and significant splenomegaly. S/p Rituxan x 4 in 2021. Currently on active surveillance  2. H/o Grade 1 intermittent rash ?related to Ibrutinib - no currently an issue.  PLAN:   -Discussed pt labwork today, 07/08/2020; CBC WNL, CMP - stable. -No lab or clinical evidence of CLL progression at this time. No indication for treatment. Will continue watchful observation.  -Recommended that the pt continue to eat well, drink at least 48-64 oz of water each day, and walk 20-30 minutes each day.  -Will see back in 4 months  3.  Patient Active Problem List   Diagnosis Date Noted  . Hx of adenomatous colonic  polyps 04/09/2020  . Abdominal distension 04/09/2020  . Bloating 04/09/2020  . History of duodenitis 04/09/2020  . Counseling regarding advance care planning and goals of care 01/08/2020  . Skin rash 10/03/2017  . CLL (chronic lymphocytic leukemia) ([place]) 12/09/2016  . General weakness 12/07/2016  . Smoker 12/07/2016  . Loss of weight 12/07/2016  . GERD (gastroesophageal reflux disease) 12/30/2010  . Chronic constipation 12/30/2010  . Hemorrhoids, external 12/30/2010    FOLLOW UP: RTC with Dr Irene Limbo with labs in 4 months    The total time spent in the appointment was 20 minutes and more than 50% was on counseling and direct patient cares.  All of the patient's questions were answered with apparent satisfaction. The patient knows to call the clinic with any problems, questions or concerns.   Sullivan Lone MD [place] AAHIVMS [facility] Hematology/Oncology Physician [facility]  (Office):       [phone] (Work cell):  [phone] (Fax):           [phone]  07/07/2020 9:30 AM  I, Reinaldo Raddle, am acting as scribe for Dr. Sullivan Lone, MD.     .I have reviewed the above documentation for accuracy and completeness, and I agree with the above. Brunetta Genera MD

## 2020-07-08 ENCOUNTER — Other Ambulatory Visit: Payer: Self-pay

## 2020-07-08 ENCOUNTER — Inpatient Hospital Stay: Payer: PPO | Attending: Hematology and Oncology

## 2020-07-08 ENCOUNTER — Inpatient Hospital Stay (HOSPITAL_BASED_OUTPATIENT_CLINIC_OR_DEPARTMENT_OTHER): Payer: PPO | Admitting: Hematology

## 2020-07-08 VITALS — BP 137/82 | HR 61 | Temp 97.0°F | Resp 20 | Ht 72.0 in | Wt 191.0 lb

## 2020-07-08 DIAGNOSIS — C9110 Chronic lymphocytic leukemia of B-cell type not having achieved remission: Secondary | ICD-10-CM | POA: Insufficient documentation

## 2020-07-08 DIAGNOSIS — I10 Essential (primary) hypertension: Secondary | ICD-10-CM | POA: Diagnosis not present

## 2020-07-08 DIAGNOSIS — F17210 Nicotine dependence, cigarettes, uncomplicated: Secondary | ICD-10-CM | POA: Insufficient documentation

## 2020-07-08 DIAGNOSIS — Z801 Family history of malignant neoplasm of trachea, bronchus and lung: Secondary | ICD-10-CM | POA: Diagnosis not present

## 2020-07-08 DIAGNOSIS — Z8052 Family history of malignant neoplasm of bladder: Secondary | ICD-10-CM | POA: Diagnosis not present

## 2020-07-08 DIAGNOSIS — Z800 Family history of malignant neoplasm of digestive organs: Secondary | ICD-10-CM | POA: Insufficient documentation

## 2020-07-08 DIAGNOSIS — D696 Thrombocytopenia, unspecified: Secondary | ICD-10-CM

## 2020-07-08 DIAGNOSIS — Z79899 Other long term (current) drug therapy: Secondary | ICD-10-CM | POA: Insufficient documentation

## 2020-07-08 LAB — CBC WITH DIFFERENTIAL/PLATELET
Abs Immature Granulocytes: 0.01 10*3/uL (ref 0.00–0.07)
Basophils Absolute: 0.1 10*3/uL (ref 0.0–0.1)
Basophils Relative: 1 %
Eosinophils Absolute: 0.2 10*3/uL (ref 0.0–0.5)
Eosinophils Relative: 3 %
HCT: 45.6 % (ref 39.0–52.0)
Hemoglobin: 15.1 g/dL (ref 13.0–17.0)
Immature Granulocytes: 0 %
Lymphocytes Relative: 23 %
Lymphs Abs: 1.3 10*3/uL (ref 0.7–4.0)
MCH: 29.8 pg (ref 26.0–34.0)
MCHC: 33.1 g/dL (ref 30.0–36.0)
MCV: 89.9 fL (ref 80.0–100.0)
Monocytes Absolute: 0.5 10*3/uL (ref 0.1–1.0)
Monocytes Relative: 8 %
Neutro Abs: 3.6 10*3/uL (ref 1.7–7.7)
Neutrophils Relative %: 65 %
Platelets: 142 10*3/uL — ABNORMAL LOW (ref 150–400)
RBC: 5.07 MIL/uL (ref 4.22–5.81)
RDW: 13.9 % (ref 11.5–15.5)
WBC: 5.7 10*3/uL (ref 4.0–10.5)
nRBC: 0 % (ref 0.0–0.2)

## 2020-07-08 LAB — CMP (CANCER CENTER ONLY)
ALT: 39 U/L (ref 0–44)
AST: 22 U/L (ref 15–41)
Albumin: 3.6 g/dL (ref 3.5–5.0)
Alkaline Phosphatase: 100 U/L (ref 38–126)
Anion gap: 6 (ref 5–15)
BUN: 16 mg/dL (ref 8–23)
CO2: 23 mmol/L (ref 22–32)
Calcium: 8.6 mg/dL — ABNORMAL LOW (ref 8.9–10.3)
Chloride: 110 mmol/L (ref 98–111)
Creatinine: 1.54 mg/dL — ABNORMAL HIGH (ref 0.61–1.24)
GFR, Estimated: 49 mL/min — ABNORMAL LOW (ref >60)
Glucose, Bld: 88 mg/dL (ref 70–99)
Potassium: 4.3 mmol/L (ref 3.5–5.1)
Sodium: 139 mmol/L (ref 135–145)
Total Bilirubin: 0.4 mg/dL (ref 0.3–1.2)
Total Protein: 6.4 g/dL — ABNORMAL LOW (ref 6.5–8.1)

## 2020-07-08 LAB — LACTATE DEHYDROGENASE: LDH: 125 U/L (ref 98–192)

## 2020-07-22 DIAGNOSIS — N1832 Chronic kidney disease, stage 3b: Secondary | ICD-10-CM | POA: Diagnosis not present

## 2020-07-29 DIAGNOSIS — N401 Enlarged prostate with lower urinary tract symptoms: Secondary | ICD-10-CM | POA: Diagnosis not present

## 2020-07-29 DIAGNOSIS — R809 Proteinuria, unspecified: Secondary | ICD-10-CM | POA: Diagnosis not present

## 2020-07-29 DIAGNOSIS — I129 Hypertensive chronic kidney disease with stage 1 through stage 4 chronic kidney disease, or unspecified chronic kidney disease: Secondary | ICD-10-CM | POA: Diagnosis not present

## 2020-07-29 DIAGNOSIS — C9110 Chronic lymphocytic leukemia of B-cell type not having achieved remission: Secondary | ICD-10-CM | POA: Diagnosis not present

## 2020-07-29 DIAGNOSIS — N1832 Chronic kidney disease, stage 3b: Secondary | ICD-10-CM | POA: Diagnosis not present

## 2020-07-29 DIAGNOSIS — Z720 Tobacco use: Secondary | ICD-10-CM | POA: Diagnosis not present

## 2020-07-29 DIAGNOSIS — R3129 Other microscopic hematuria: Secondary | ICD-10-CM | POA: Diagnosis not present

## 2020-09-05 ENCOUNTER — Ambulatory Visit: Payer: Self-pay | Admitting: *Deleted

## 2020-09-05 NOTE — Telephone Encounter (Signed)
Pyt's wife Wesley Bright called stating the pt tested positive for COVID on 08/21/20; his symptoms started 08/20/20;  he is having weakness, sore throat, headache, coughing,  Chills, no energy; chest pain from coughing; productive cough with white secretions, and shortness of breath; the pt says he was seen at [facility] and was given "mornupiravir"; recommendations made per nurse triage protocol; she verbalized understanding andt would like to have EMS called for her and her husband; pt transferred to [facility], Operator # [phone] for transport to the ED.  Reason for Disposition . Sounds like a life-threatening emergency to the triager  Answer Assessment - Initial Assessment Questions 1. COVID-19 DIAGNOSIS: "Who made your COVID-19 diagnosis?" "Was it confirmed by a positive lab test or self-test?" If not diagnosed by a doctor (or NP/PA), ask "Are there lots of cases (community spread) where you live?" Note: See public health department website, if unsure.     08/21/20 at [facility] 2. COVID-19 EXPOSURE: "Was there any known exposure to COVID before the symptoms began?" CDC Definition of close contact: within 6 feet (2 meters) for a total of 15 minutes or more over a 24-hour period.      n/a 3. ONSET: "When did the COVID-19 symptoms start?"      08/20/20 4. WORST SYMPTOM: "What is your worst symptom?" (e.g., cough, fever, shortness of breath, muscle aches)     Shortness of breath, cough, fever 5. COUGH: "Do you have a cough?" If Yes, ask: "How bad is the cough?"     yes 6. FEVER: "Do you have a fever?" If Yes, ask: "What is your temperature, how was it measured, and when did it start?"   101ish 7. RESPIRATORY STATUS: "Describe your breathing?" (e.g., shortness of breath, wheezing, unable to speak)      Shortness of breath 8. BETTER-SAME-WORSE: "Are you getting better, staying the same or getting worse compared to yesterday?"  If getting worse, ask, "In what way?"     worse 9. HIGH RISK DISEASE: "Do you  have any chronic medical problems?" (e.g., asthma, heart or lung disease, weak immune system, obesity, etc.)  CLL 10. VACCINE: "Have you had the COVID-19 vaccine?" If Yes, ask: "Which one, how many shots, when did you get it?"        11. BOOSTER: "Have you received your COVID-19 booster?" If Yes, ask: "Which one and when did you get it?"        12. PREGNANCY: "Is there any chance you are pregnant?" "When was your last menstrual period?"       n/a 13. OTHER SYMPTOMS: "Do you have any other symptoms?"  (e.g., chills, fatigue, headache, loss of smell or taste, muscle pain, sore throat)     Headache, fatigue, weakness 14. O2 SATURATION MONITOR:  "Do you use an oxygen saturation monitor (pulse oximeter) at home?" If Yes, ask "What is your reading (oxygen level) today?" "What is your usual oxygen saturation reading?" (e.g., 95%)  Protocols used: CORONAVIRUS (COVID-19) DIAGNOSED OR SUSPECTED-A-AH

## 2020-09-06 ENCOUNTER — Other Ambulatory Visit: Payer: Self-pay

## 2020-09-06 ENCOUNTER — Emergency Department (HOSPITAL_BASED_OUTPATIENT_CLINIC_OR_DEPARTMENT_OTHER)
Admission: EM | Admit: 2020-09-06 | Discharge: 2020-09-06 | Disposition: A | Discharge status: Home / Self Care | Payer: PPO | Attending: Emergency Medicine | Admitting: Emergency Medicine

## 2020-09-06 DIAGNOSIS — R059 Cough, unspecified: Secondary | ICD-10-CM

## 2020-09-06 DIAGNOSIS — R0602 Shortness of breath: Secondary | ICD-10-CM | POA: Insufficient documentation

## 2020-09-06 DIAGNOSIS — U099 Post covid-19 condition, unspecified: Secondary | ICD-10-CM | POA: Insufficient documentation

## 2020-09-06 DIAGNOSIS — I10 Essential (primary) hypertension: Secondary | ICD-10-CM | POA: Insufficient documentation

## 2020-09-06 DIAGNOSIS — Z79899 Other long term (current) drug therapy: Secondary | ICD-10-CM | POA: Insufficient documentation

## 2020-09-06 DIAGNOSIS — F17210 Nicotine dependence, cigarettes, uncomplicated: Secondary | ICD-10-CM | POA: Diagnosis not present

## 2020-09-06 LAB — COMPREHENSIVE METABOLIC PANEL
ALT: 21 U/L (ref 0–44)
AST: 17 U/L (ref 15–41)
Albumin: 3.8 g/dL (ref 3.5–5.0)
Alkaline Phosphatase: 76 U/L (ref 38–126)
Anion gap: 9 (ref 5–15)
BUN: 18 mg/dL (ref 8–23)
CO2: 21 mmol/L — ABNORMAL LOW (ref 22–32)
Calcium: 9 mg/dL (ref 8.9–10.3)
Chloride: 109 mmol/L (ref 98–111)
Creatinine, Ser: 1.29 mg/dL — ABNORMAL HIGH (ref 0.61–1.24)
GFR, Estimated: >60 mL/min (ref >60)
Glucose, Bld: 89 mg/dL (ref 70–99)
Potassium: 3.9 mmol/L (ref 3.5–5.1)
Sodium: 139 mmol/L (ref 135–145)
Total Bilirubin: 0.3 mg/dL (ref 0.3–1.2)
Total Protein: 6.7 g/dL (ref 6.5–8.1)

## 2020-09-06 LAB — CBC WITH DIFFERENTIAL/PLATELET
Abs Immature Granulocytes: 0.03 10*3/uL (ref 0.00–0.07)
Basophils Absolute: 0.1 10*3/uL (ref 0.0–0.1)
Basophils Relative: 1 %
Eosinophils Absolute: 0.3 10*3/uL (ref 0.0–0.5)
Eosinophils Relative: 4 %
HCT: 43.9 % (ref 39.0–52.0)
Hemoglobin: 14.2 g/dL (ref 13.0–17.0)
Immature Granulocytes: 0 %
Lymphocytes Relative: 18 %
Lymphs Abs: 1.4 10*3/uL (ref 0.7–4.0)
MCH: 28.7 pg (ref 26.0–34.0)
MCHC: 32.3 g/dL (ref 30.0–36.0)
MCV: 88.9 fL (ref 80.0–100.0)
Monocytes Absolute: 0.8 10*3/uL (ref 0.1–1.0)
Monocytes Relative: 10 %
Neutro Abs: 5.3 10*3/uL (ref 1.7–7.7)
Neutrophils Relative %: 67 %
Platelets: 183 10*3/uL (ref 150–400)
RBC: 4.94 MIL/uL (ref 4.22–5.81)
RDW: 14.2 % (ref 11.5–15.5)
WBC: 7.9 10*3/uL (ref 4.0–10.5)
nRBC: 0 % (ref 0.0–0.2)

## 2020-09-06 LAB — D-DIMER, QUANTITATIVE: D-Dimer, Quant: 0.59 ug/mL-FEU — ABNORMAL HIGH (ref 0.00–0.50)

## 2020-09-06 LAB — BRAIN NATRIURETIC PEPTIDE: B Natriuretic Peptide: 27.9 pg/mL (ref 0.0–100.0)

## 2020-09-06 LAB — TROPONIN I (HIGH SENSITIVITY): Troponin I (High Sensitivity): 5 ng/L (ref <18)

## 2020-09-06 MED ORDER — ALBUTEROL SULFATE HFA 108 (90 BASE) MCG/ACT IN AERS
2.0000 | INHALATION_SPRAY | RESPIRATORY_TRACT | Status: DC | PRN
  Administered 2020-09-06: 2 via RESPIRATORY_TRACT
  Filled 2020-09-06: qty 6.7

## 2020-09-06 MED ORDER — IPRATROPIUM-ALBUTEROL 0.5-2.5 (3) MG/3ML IN SOLN
3.0000 mL | Freq: Once | RESPIRATORY_TRACT | Status: AC
  Administered 2020-09-06: 3 mL via RESPIRATORY_TRACT
  Filled 2020-09-06: qty 3

## 2020-09-06 MED ORDER — AEROCHAMBER PLUS FLO-VU LARGE MISC
1.0000 | Freq: Once | Status: AC
  Administered 2020-09-06: 1
  Filled 2020-09-06: qty 1

## 2020-09-06 MED ORDER — BENZONATATE 100 MG PO CAPS: 200.0000 mg | ORAL_CAPSULE | Freq: Two times a day (BID) | ORAL | 0 refills | Status: AC | PRN

## 2020-09-06 NOTE — ED Triage Notes (Signed)
Pt arrives pov with c/o cough and shob that worsens at night when lying down. Pt also reports fever x 2 days pta. Pt endorses Covid + test on 08/21/20

## 2020-09-06 NOTE — ED Notes (Signed)
Pt verbalizes understanding of discharge instructions. Opportunity for questioning and answers were provided. Armand removed by staff, pt discharged from ED ambulatory to Home with Wife.

## 2020-09-07 NOTE — ED Provider Notes (Signed)
[facility] EMERGENCY DEPT Provider Note   CSN: [id number] Arrival date & time: 09/06/20  1819     History Chief Complaint  Patient presents with  . Cough    Wesley Bright is a 70 y.o. male.  70 year old male with past medical history below who presents with cough and shortness of breath.  The patient tested positive for COVID-19 on 08/21/2020 and his wife developed COVID 2 days later.  He has had cough and fatigue and initially fevers. His cough has been persistent and is worse at night. He feels more short of breath at night when his coughing is worse. He denies chest pain but is sore from all the coughing. No vomiting or diarrhea. He does have intermittent headaches.  The history is provided by the patient and the spouse.  Cough      Past Medical History:  Diagnosis Date  . External hemorrhoid   . Hypertension   . Leukemia ([place]) 2018    Patient Active Problem List   Diagnosis Date Noted  . Hx of adenomatous colonic polyps 04/09/2020  . Abdominal distension 04/09/2020  . Bloating 04/09/2020  . History of duodenitis 04/09/2020  . Counseling regarding advance care planning and goals of care 01/08/2020  . Skin rash 10/03/2017  . CLL (chronic lymphocytic leukemia) ([place]) 12/09/2016  . General weakness 12/07/2016  . Smoker 12/07/2016  . Loss of weight 12/07/2016  . GERD (gastroesophageal reflux disease) 12/30/2010  . Chronic constipation 12/30/2010  . Hemorrhoids, external 12/30/2010    Past Surgical History:  Procedure Laterality Date  . APPENDECTOMY    . INGUINAL HERNIA REPAIR     left  . KNEE SURGERY     Left Knee       Family History  Problem Relation Age of Onset  . Liver cancer Brother   . Cancer Brother        Liver  . Esophageal cancer Father   . Cancer Father        Throat   . Cancer Mother        Bladder - cause of death  . Pancreatic cancer Maternal Uncle   . Pancreatic cancer Paternal Uncle   . Stomach cancer Maternal Uncle   .  Colon cancer Neg Hx   . Inflammatory bowel disease Neg Hx   . Rectal cancer Neg Hx     Social History   Tobacco Use  . Smoking status: Current Every Day Smoker    Packs/day: 0.50    Years: 50.00    Pack years: 25.00  . Smokeless tobacco: Never Used  Vaping Use  . Vaping Use: Never used  Substance Use Topics  . Alcohol use: Yes    Comment: occasional  . Drug use: No    Home Medications Prior to Admission medications   Medication Sig Start Date End Date Taking? Authorizing Provider  benzonatate (TESSALON) 100 MG capsule Take 2 capsules (200 mg total) by mouth 2 (two) times daily as needed for up to 4 days for cough. 09/06/20 09/10/20 Yes Yazmyn Valbuena, Wenda Overland, MD  amLODipine (NORVASC) 5 MG tablet Take 5 mg by mouth daily.    [provider]  cholecalciferol (VITAMIN D) 1000 units tablet Take 1,000 Units by mouth daily.    [provider]  co-enzyme Q-10 30 MG capsule Take 30 mg by mouth 3 (three) times daily.    [provider]  omeprazole (PRILOSEC) 40 MG capsule Take 1 capsule (40 mg total) by mouth in the morning and  at bedtime. 05/30/20   Mansouraty, Telford Nab., MD  rosuvastatin (CRESTOR) 10 MG tablet Take 1 tablet (10 mg total) by mouth daily. 07/02/20   Just, Laurita Quint, FNP  vitamin B-12 (CYANOCOBALAMIN) 100 MCG tablet Take 100 mcg by mouth daily.    [provider]    Allergies    Patient has no known allergies.  Review of Systems   Review of Systems  Respiratory: Positive for cough.    All other systems reviewed and are negative except that which was mentioned in HPI  Physical Exam Updated Vital Signs BP 126/80 (BP Location: Right Arm)   Pulse 61   Temp 98.3 F (36.8 C)   Resp (!) 27   Ht 6' (1.829 m)   Wt 83.9 kg   SpO2 95%   BMI 25.09 kg/m   Physical Exam Constitutional:      General: He is not in acute distress.    Appearance: Normal appearance.  HENT:     Head: Normocephalic and atraumatic.  Eyes:      Conjunctiva/sclera: Conjunctivae normal.  Cardiovascular:     Rate and Rhythm: Normal rate and regular rhythm.     Heart sounds: Normal heart sounds. No murmur heard.   Pulmonary:     Effort: Pulmonary effort is normal.     Comments: Occasional faint wheeze in R lung Abdominal:     General: Abdomen is flat. Bowel sounds are normal. There is no distension.     Palpations: Abdomen is soft.     Tenderness: There is no abdominal tenderness.  Musculoskeletal:     Right lower leg: No edema.     Left lower leg: No edema.  Skin:    General: Skin is warm and dry.  Neurological:     Mental Status: He is alert and oriented to person, place, and time.     Comments: fluent  Psychiatric:        Mood and Affect: Mood normal.        Behavior: Behavior normal.     ED Results / Procedures / Treatments   Labs (all labs ordered are listed, but only abnormal results are displayed) Labs Reviewed  COMPREHENSIVE METABOLIC PANEL - Abnormal; Notable for the following components:      Result Value   CO2 21 (*)    Creatinine, Ser 1.29 (*)    All other components within normal limits  D-DIMER, QUANTITATIVE - Abnormal; Notable for the following components:   D-Dimer, Quant 0.59 (*)    All other components within normal limits  CBC WITH DIFFERENTIAL/PLATELET  BRAIN NATRIURETIC PEPTIDE  TROPONIN I (HIGH SENSITIVITY)    EKG None  Radiology No results found.  Procedures Procedures   Medications Ordered in ED Medications  albuterol (VENTOLIN HFA) 108 (90 Base) MCG/ACT inhaler 2 puff (2 puffs Inhalation Given 09/06/20 2239)  ipratropium-albuterol (DUONEB) 0.5-2.5 (3) MG/3ML nebulizer solution 3 mL (3 mLs Nebulization Given 09/06/20 1927)  AeroChamber Plus Flo-Vu Large MISC 1 each (1 each Other Given 09/06/20 2239)    ED Course  I have reviewed the triage vital signs and the nursing notes.  Pertinent labs & imaging results that were available during my care of the patient were reviewed by me  and considered in my medical decision making (see chart for details).    MDM Rules/Calculators/A&P                          Frequently coughing on exam but  no respiratory distress. VS reassuring, normal O2 sats.  Lab work shows creatinine stable compared to previous, normal LFTs, normal CBC, negative troponin and BNP.  D-dimer of 0.59 is below his age-adjusted cutoff of 0.68 making PE very unlikely.  His chest x-ray from today at urgent care is normal.  I suspect he is having lingering COVID symptoms given his age and comorbidities.  His work-up has been very reassuring here.  Gave a DuoNeb in the ED which he states significantly improved his cough and he was able to rest.  We will provide with inhaler to use as needed at home as well as cough medication.  I have encouraged him to follow-up with his PCP.  Return precautions reviewed.  Wesley Bright was evaluated in Emergency Department on 09/07/2020 for the symptoms described in the history of present illness. He was evaluated in the context of the global COVID-19 pandemic, which necessitated consideration that the patient might be at risk for infection with the SARS-CoV-2 virus that causes COVID-19. Institutional protocols and algorithms that pertain to the evaluation of patients at risk for COVID-19 are in a state of rapid change based on information released by regulatory bodies including the CDC and federal and state organizations. These policies and algorithms were followed during the patient's care in the ED.  Final Clinical Impression(s) / ED Diagnoses Final diagnoses:  Post covid-19 condition, unspecified  Cough    Rx / DC Orders ED Discharge Orders         Ordered    benzonatate (TESSALON) 100 MG capsule  2 times daily PRN        09/06/20 2219           Bambie Pizzolato, Wenda Overland, MD 09/07/20 [phone]

## 2020-09-16 ENCOUNTER — Telehealth: Payer: Self-pay | Admitting: Hematology

## 2020-09-16 NOTE — Telephone Encounter (Signed)
R/s per 2/20 los, Patient did not answer, calendar sent.

## 2020-09-17 ENCOUNTER — Telehealth: Payer: Self-pay | Admitting: Hematology

## 2020-09-17 NOTE — Telephone Encounter (Signed)
Called pt to r/s appts per 5/3 sch msg. No answer. Left msg for pt to call back to r/s.  

## 2020-10-11 DIAGNOSIS — H524 Presbyopia: Secondary | ICD-10-CM | POA: Diagnosis not present

## 2020-11-04 ENCOUNTER — Ambulatory Visit: Payer: PPO | Admitting: Hematology

## 2020-11-04 ENCOUNTER — Other Ambulatory Visit: Payer: PPO

## 2020-11-11 DIAGNOSIS — Z125 Encounter for screening for malignant neoplasm of prostate: Secondary | ICD-10-CM | POA: Diagnosis not present

## 2020-11-11 DIAGNOSIS — D696 Thrombocytopenia, unspecified: Secondary | ICD-10-CM | POA: Diagnosis not present

## 2020-11-11 DIAGNOSIS — Z856 Personal history of leukemia: Secondary | ICD-10-CM | POA: Diagnosis not present

## 2020-11-11 DIAGNOSIS — R7989 Other specified abnormal findings of blood chemistry: Secondary | ICD-10-CM | POA: Diagnosis not present

## 2020-11-11 DIAGNOSIS — Z79899 Other long term (current) drug therapy: Secondary | ICD-10-CM | POA: Diagnosis not present

## 2020-11-11 DIAGNOSIS — K2980 Duodenitis without bleeding: Secondary | ICD-10-CM | POA: Diagnosis not present

## 2020-11-11 DIAGNOSIS — Z0001 Encounter for general adult medical examination with abnormal findings: Secondary | ICD-10-CM | POA: Diagnosis not present

## 2020-11-11 DIAGNOSIS — F17210 Nicotine dependence, cigarettes, uncomplicated: Secondary | ICD-10-CM | POA: Diagnosis not present

## 2020-11-13 DIAGNOSIS — E0590 Thyrotoxicosis, unspecified without thyrotoxic crisis or storm: Secondary | ICD-10-CM | POA: Diagnosis not present

## 2020-12-06 ENCOUNTER — Ambulatory Visit: Payer: PPO | Admitting: Hematology

## 2020-12-06 ENCOUNTER — Other Ambulatory Visit: Payer: PPO

## 2020-12-30 DIAGNOSIS — D1801 Hemangioma of skin and subcutaneous tissue: Secondary | ICD-10-CM | POA: Diagnosis not present

## 2020-12-30 DIAGNOSIS — L578 Other skin changes due to chronic exposure to nonionizing radiation: Secondary | ICD-10-CM | POA: Diagnosis not present

## 2020-12-30 DIAGNOSIS — L820 Inflamed seborrheic keratosis: Secondary | ICD-10-CM | POA: Diagnosis not present

## 2020-12-30 DIAGNOSIS — L821 Other seborrheic keratosis: Secondary | ICD-10-CM | POA: Diagnosis not present

## 2020-12-30 DIAGNOSIS — L814 Other melanin hyperpigmentation: Secondary | ICD-10-CM | POA: Diagnosis not present

## 2021-01-06 ENCOUNTER — Inpatient Hospital Stay: Payer: PPO | Attending: Hematology

## 2021-01-06 ENCOUNTER — Telehealth: Payer: Self-pay | Admitting: Hematology

## 2021-01-06 ENCOUNTER — Other Ambulatory Visit: Payer: Self-pay

## 2021-01-06 ENCOUNTER — Inpatient Hospital Stay (HOSPITAL_BASED_OUTPATIENT_CLINIC_OR_DEPARTMENT_OTHER): Payer: PPO | Admitting: Hematology

## 2021-01-06 VITALS — BP 146/90 | HR 65 | Temp 97.7°F | Resp 17 | Wt 184.7 lb

## 2021-01-06 DIAGNOSIS — D696 Thrombocytopenia, unspecified: Secondary | ICD-10-CM | POA: Diagnosis not present

## 2021-01-06 DIAGNOSIS — C9110 Chronic lymphocytic leukemia of B-cell type not having achieved remission: Secondary | ICD-10-CM | POA: Diagnosis not present

## 2021-01-06 LAB — CMP (CANCER CENTER ONLY)
ALT: 13 U/L (ref 0–44)
AST: 11 U/L — ABNORMAL LOW (ref 15–41)
Albumin: 3.5 g/dL (ref 3.5–5.0)
Alkaline Phosphatase: 93 U/L (ref 38–126)
Anion gap: 8 (ref 5–15)
BUN: 15 mg/dL (ref 8–23)
CO2: 22 mmol/L (ref 22–32)
Calcium: 8.9 mg/dL (ref 8.9–10.3)
Chloride: 113 mmol/L — ABNORMAL HIGH (ref 98–111)
Creatinine: 1.82 mg/dL — ABNORMAL HIGH (ref 0.61–1.24)
GFR, Estimated: 40 mL/min — ABNORMAL LOW (ref >60)
Glucose, Bld: 108 mg/dL — ABNORMAL HIGH (ref 70–99)
Potassium: 4.4 mmol/L (ref 3.5–5.1)
Sodium: 143 mmol/L (ref 135–145)
Total Bilirubin: 0.4 mg/dL (ref 0.3–1.2)
Total Protein: 6.3 g/dL — ABNORMAL LOW (ref 6.5–8.1)

## 2021-01-06 LAB — CBC WITH DIFFERENTIAL/PLATELET
Abs Immature Granulocytes: 0 10*3/uL (ref 0.00–0.07)
Basophils Absolute: 0 10*3/uL (ref 0.0–0.1)
Basophils Relative: 1 %
Eosinophils Absolute: 0.2 10*3/uL (ref 0.0–0.5)
Eosinophils Relative: 3 %
HCT: 47.7 % (ref 39.0–52.0)
Hemoglobin: 15.8 g/dL (ref 13.0–17.0)
Immature Granulocytes: 0 %
Lymphocytes Relative: 25 %
Lymphs Abs: 1.4 10*3/uL (ref 0.7–4.0)
MCH: 29.9 pg (ref 26.0–34.0)
MCHC: 33.1 g/dL (ref 30.0–36.0)
MCV: 90.3 fL (ref 80.0–100.0)
Monocytes Absolute: 0.3 10*3/uL (ref 0.1–1.0)
Monocytes Relative: 6 %
Neutro Abs: 3.6 10*3/uL (ref 1.7–7.7)
Neutrophils Relative %: 65 %
Platelets: 133 10*3/uL — ABNORMAL LOW (ref 150–400)
RBC: 5.28 MIL/uL (ref 4.22–5.81)
RDW: 13.4 % (ref 11.5–15.5)
WBC: 5.5 10*3/uL (ref 4.0–10.5)
nRBC: 0 % (ref 0.0–0.2)

## 2021-01-06 LAB — LACTATE DEHYDROGENASE: LDH: 133 U/L (ref 98–192)

## 2021-01-06 NOTE — Telephone Encounter (Signed)
Left message with follow-up appointment per 8/22 los. 

## 2021-01-12 ENCOUNTER — Encounter: Payer: Self-pay | Admitting: Hematology

## 2021-01-12 NOTE — Progress Notes (Signed)
HEMATOLOGY/ONCOLOGY CLINIC NOTE  Date of Service: 01/12/2021  Patient Care Team: [name], [place] as PCP - General (Family Medicine)  CHIEF COMPLAINTS/PURPOSE OF CONSULTATION:  F/u for Chronic Lymphocytic Leukemia   Oncologic History:  [name] with CLL after a CLL FISH study revealed heterozygous 13q deletion within 92% cells. Subsequent imaging showed diffuse non-bulky lymphadenopathy in chest, abdomen, and pelvis with splenomegaly of 1200 cm^3. The pt began daily Ibrutinib '420mg'$  on 01/27/17 after becoming increasing fatigued and lost 15 pounds of weight over 4-5 months.   HISTORY OF PRESENTING ILLNESS:   Wesley Bright is a wonderful 70 y.o. male who has been previously seen by my colleague Dr Grace Isaac for evaluation and management of Chronic Lymphocytic Leukemia. He is accompanied today by his daughter and wife. The pt reports that he is doing well overall.   The pt reports that he is feeling better and his energy levels have been improving. He has continued to take Vitamin D, Vitamin B12 and co-enzyme Q. He notes no problems taking his Ibrutinib besides mild rashes that come and go without itching or bothering him very much.   He notes that he has had problems in the past with fluid in the abdomen, and has taken medication for this. He has also intermittently had constipation.   Most recent lab results (11/03/17) of CBC  is as follows: all values are WNL except for WBC at 30.3k, HGB at 12.3, HCT at 37.9, RDW at 15.0, PLT at 109k, Lymphs abs at 26.8k, Creatinine at 1.62, Total Protein at 6.1, Total bilirubin at <0.2, GFR at 43. LDH 6/19;19 is WNL at 164 Uric Acid 11/03/17 is WNL at 4.3  On review of systems, pt reports mild skin rashes, increasing energy levels, and denies mouth sores, fevers, chills, diarrhea, inguinal rashes, itching, vision changes, abdominal pains, leg swelling, and any other symptoms.   Interval History:   Wesley Bright returns today for management and evaluation of his CLL. The patient's last visit with [place] was on 07/08/2020.  The pt reports that he is doing well overall and had a good vacation in [place].  Patient reports no acute new symptoms.  No fevers no chills no night sweats no new lumps or bumps. Lab results today 01/06/2021 of CBC w/diff and CMP reviewed with patient  On review of systems, patient reports he had COVID-19 infection and tested positive on 08/21/2020 and his wife developed COVID 2 days later.  He had cough and initial fevers and some mild shortness of breath.  No reported hypoxia.  No hospitalization.  No nausea vomiting or diarrhea.  Symptoms resolved completely.  He notes he was also evaluated at [facility] for possible elevated thyroid hormone levels but notes no treatment at this time.  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[employer]  [employer]  Tobacco Use   Smoking status: Every Day    Packs/day: 0.50    Years: 50.00    Pack years: 25.00    Types: Cigarettes   Smokeless tobacco: Never  Vaping Use   Vaping Use: Never used  Substance and Sexual Activity   Alcohol use: Yes    Comment: occasional   Drug use: No   Sexual activity: Not on file  Other Topics Concern   Not on file  Social History Narrative   Not on file   Social Determinants of Health   Financial Resource Strain: Not on file  Food Insecurity: Not on file  Transportation Needs: Not on  file  Physical Activity: Not on file  Stress: Not on file  Social Connections: Not on file  Intimate Partner Violence: Not on file    FAMILY HISTORY: Family History  Problem Relation Age of Onset   Liver cancer Brother    Cancer Brother        Liver   Esophageal cancer Father    Cancer Father        Throat    Cancer Mother        Bladder - cause of death   Pancreatic cancer Maternal Uncle    Pancreatic cancer Paternal Uncle    Stomach cancer Maternal Uncle    Colon cancer Neg Hx    Inflammatory bowel disease Neg Hx    Rectal cancer Neg Hx     ALLERGIES:  has No Known Allergies.  MEDICATIONS:  Current Outpatient Medications  Medication Sig Dispense Refill   amLODipine (NORVASC) 5 MG tablet Take 5 mg by mouth daily.     cholecalciferol (VITAMIN D) 1000 units tablet Take 1,000 Units by mouth daily.     co-enzyme Q-10 30 MG capsule Take 30 mg by mouth 3 (three) times daily.     omeprazole (PRILOSEC) 40 MG capsule Take 1 capsule (40 mg total) by mouth in the morning and at bedtime. 60 capsule 3   rosuvastatin (CRESTOR) 10 MG tablet Take 1 tablet (10 mg total) by mouth daily. 90 tablet 3   vitamin B-12 (CYANOCOBALAMIN) 100 MCG tablet Take 100 mcg by mouth daily.     No current facility-administered medications for this visit.    REVIEW OF SYSTEMS:   10 Point review of Systems was done is negative except as noted above.  PHYSICAL EXAMINATION: ECOG PERFORMANCE STATUS: 1 - Symptomatic but completely ambulatory  Vitals:   01/06/21 1355  BP: (!) 146/90  Pulse: 65  Resp: 17  Temp: 97.7 F (36.5 C)  SpO2: 98%   Filed Weights   01/06/21 1355  Weight: 184 lb 11.2 oz (83.8 kg)   .Body mass index is 25.05 kg/m.   NAD . GENERAL:alert, in no acute distress and comfortable SKIN: no acute rashes, no significant lesions EYES: conjunctiva are pink and non-injected, sclera anicteric OROPHARYNX: MMM, no exudates, no oropharyngeal erythema or ulceration NECK: supple, no  JVD LYMPH:  no palpable lymphadenopathy in the cervical, axillary or inguinal regions LUNGS: clear to auscultation b/l with normal respiratory effort HEART: regular rate & rhythm ABDOMEN:  normoactive bowel sounds , non tender, not distended. Extremity: no pedal edema PSYCH: alert & oriented x 3 with fluent speech NEURO: no focal motor/sensory deficits   LABORATORY DATA:  I have reviewed the data as listed  . CBC Latest Ref Rng & Units 01/06/2021 09/06/2020 07/08/2020  WBC 4.0 - 10.5 K/uL 5.5 7.9 5.7  Hemoglobin 13.0 -  17.0 g/dL 15.8 14.2 15.1  Hematocrit 39.0 - 52.0 % 47.7 43.9 45.6  Platelets 150 - 400 K/uL 133(L) 183 142(L)    . CMP Latest Ref Rng & Units 01/06/2021 09/06/2020 07/08/2020  Glucose 70 - 99 mg/dL 108(H) 89 88  BUN 8 - 23 mg/dL '15 18 16  '$ Creatinine 0.61 - 1.24 mg/dL 1.82(H) 1.29(H) 1.54(H)  Sodium 135 - 145 mmol/L 143 139 139  Potassium 3.5 - 5.1 mmol/L 4.4 3.9 4.3  Chloride 98 - 111 mmol/L 113(H) 109 110  CO2 22 - 32 mmol/L 22 21(L) 23  Calcium 8.9 - 10.3 mg/dL 8.9 9.0 8.6(L)  Total Protein 6.5 - 8.1 g/dL 6.3(L) 6.7 6.4(L)  Total Bilirubin 0.3 - 1.2 mg/dL 0.4 0.3 0.4  Alkaline Phos 38 - 126 U/L 93 76 100  AST 15 - 41 U/L 11(L) 17 22  ALT 0 - 44 U/L 13 21 39   06/20/2019 Kidney Biopsy:    12/31/16 Flow Cytometry:    12/15/16 CLL FISH:    RADIOGRAPHIC STUDIES: I have personally reviewed the radiological images as listed and agreed with the findings in the report. No results found.  ASSESSMENT & PLAN:  70 y.o. male with  1. Chronic Lymphocytic Leukemia - monoalleilic 0000000 deletion. Was started on Ibrutinib from 01/2017 due to fatigue, weight loss and significant splenomegaly. S/p Rituxan x 4 in 2021. Currently on active surveillance and off ibrutinib after receiving Rituxan.  2. H/o Grade 1 intermittent rash ?related to Ibrutinib - no currently an issue.  Off ibrutinib  PLAN:   -Discussed pt labwork today, 01/06/2021. -CBC within normal limits other  than mild thrombocytopenia with platelets of 133k no lymphocytosis. -CMP shows chronic kidney disease slight bump in creatinine to 1.82.  Recommended drinking more water. -LDH within normal limits at 133. -No lab or clinical evidence of CLL progression at this time. No indication for treatment. Will continue watchful observation.  -Recommended that the pt continue to eat well, drink at least 48-64 oz of water each day, and walk 20-30 minutes each day.  -Will see back in 4 months  3.  Patient Active Problem List   Diagnosis Date Noted   Hx of adenomatous colonic polyps 04/09/2020   Abdominal distension 04/09/2020   Bloating 04/09/2020   History of duodenitis 04/09/2020   Counseling regarding advance care planning and goals of care 01/08/2020   Skin rash 10/03/2017   CLL (chronic lymphocytic leukemia) ([place]) 12/09/2016   General weakness 12/07/2016   Smoker 12/07/2016   Loss of weight 12/07/2016   GERD (gastroesophageal reflux disease) 12/30/2010   Chronic constipation 12/30/2010   Hemorrhoids, external 12/30/2010    FOLLOW UP: RTC with Dr Irene Limbo with labs in 4 months Continue follow-up with primary care physician for management of other medical co-morbidities.  . The total time spent in the appointment was 20 minutes and more than 50% was on counseling and direct patient cares.  All of the patient's questions were answered with apparent satisfaction. The patient knows to call the clinic with any problems, questions or concerns.   Sullivan Lone MD [place] AAHIVMS [facility] Hematology/Oncology Physician [facility]  (Office):       [phone] (Work cell):  [phone] (Fax):           [phone]

## 2021-01-16 ENCOUNTER — Encounter: Payer: Self-pay | Admitting: Gastroenterology

## 2021-02-03 ENCOUNTER — Other Ambulatory Visit: Payer: Self-pay

## 2021-02-03 ENCOUNTER — Observation Stay (HOSPITAL_BASED_OUTPATIENT_CLINIC_OR_DEPARTMENT_OTHER)
Admission: EM | Admit: 2021-02-03 | Discharge: 2021-02-04 | Disposition: A | Discharge status: Home / Self Care | Payer: PPO | Attending: Internal Medicine | Admitting: Internal Medicine

## 2021-02-03 ENCOUNTER — Emergency Department (HOSPITAL_BASED_OUTPATIENT_CLINIC_OR_DEPARTMENT_OTHER): Payer: PPO

## 2021-02-03 ENCOUNTER — Encounter (HOSPITAL_BASED_OUTPATIENT_CLINIC_OR_DEPARTMENT_OTHER): Payer: Self-pay

## 2021-02-03 DIAGNOSIS — F17210 Nicotine dependence, cigarettes, uncomplicated: Secondary | ICD-10-CM | POA: Diagnosis not present

## 2021-02-03 DIAGNOSIS — Z79899 Other long term (current) drug therapy: Secondary | ICD-10-CM | POA: Diagnosis not present

## 2021-02-03 DIAGNOSIS — R9431 Abnormal electrocardiogram [ECG] [EKG]: Secondary | ICD-10-CM | POA: Diagnosis not present

## 2021-02-03 DIAGNOSIS — Z20822 Contact with and (suspected) exposure to covid-19: Secondary | ICD-10-CM | POA: Diagnosis not present

## 2021-02-03 DIAGNOSIS — R42 Dizziness and giddiness: Secondary | ICD-10-CM | POA: Diagnosis not present

## 2021-02-03 DIAGNOSIS — R55 Syncope and collapse: Secondary | ICD-10-CM | POA: Diagnosis not present

## 2021-02-03 DIAGNOSIS — E785 Hyperlipidemia, unspecified: Secondary | ICD-10-CM

## 2021-02-03 DIAGNOSIS — K219 Gastro-esophageal reflux disease without esophagitis: Secondary | ICD-10-CM | POA: Diagnosis not present

## 2021-02-03 DIAGNOSIS — R0602 Shortness of breath: Secondary | ICD-10-CM | POA: Diagnosis not present

## 2021-02-03 DIAGNOSIS — I10 Essential (primary) hypertension: Secondary | ICD-10-CM | POA: Diagnosis not present

## 2021-02-03 DIAGNOSIS — I491 Atrial premature depolarization: Secondary | ICD-10-CM | POA: Diagnosis not present

## 2021-02-03 DIAGNOSIS — N179 Acute kidney failure, unspecified: Secondary | ICD-10-CM | POA: Diagnosis not present

## 2021-02-03 DIAGNOSIS — I493 Ventricular premature depolarization: Secondary | ICD-10-CM | POA: Diagnosis not present

## 2021-02-03 LAB — URINALYSIS, ROUTINE W REFLEX MICROSCOPIC
Bilirubin Urine: NEGATIVE
Glucose, UA: NEGATIVE mg/dL
Ketones, ur: NEGATIVE mg/dL
Leukocytes,Ua: NEGATIVE
Nitrite: NEGATIVE
Protein, ur: NEGATIVE mg/dL
Specific Gravity, Urine: 1.015 (ref 1.005–1.030)
pH: 6 (ref 5.0–8.0)

## 2021-02-03 LAB — CBC WITH DIFFERENTIAL/PLATELET
Abs Immature Granulocytes: 0.01 10*3/uL (ref 0.00–0.07)
Basophils Absolute: 0.1 10*3/uL (ref 0.0–0.1)
Basophils Relative: 1 %
Eosinophils Absolute: 0.2 10*3/uL (ref 0.0–0.5)
Eosinophils Relative: 3 %
HCT: 49.1 % (ref 39.0–52.0)
Hemoglobin: 16.5 g/dL (ref 13.0–17.0)
Immature Granulocytes: 0 %
Lymphocytes Relative: 23 %
Lymphs Abs: 1.5 10*3/uL (ref 0.7–4.0)
MCH: 30.2 pg (ref 26.0–34.0)
MCHC: 33.6 g/dL (ref 30.0–36.0)
MCV: 89.9 fL (ref 80.0–100.0)
Monocytes Absolute: 0.5 10*3/uL (ref 0.1–1.0)
Monocytes Relative: 8 %
Neutro Abs: 4.2 10*3/uL (ref 1.7–7.7)
Neutrophils Relative %: 65 %
Platelets: 143 10*3/uL — ABNORMAL LOW (ref 150–400)
RBC: 5.46 MIL/uL (ref 4.22–5.81)
RDW: 13.5 % (ref 11.5–15.5)
WBC: 6.5 10*3/uL (ref 4.0–10.5)
nRBC: 0 % (ref 0.0–0.2)

## 2021-02-03 LAB — COMPREHENSIVE METABOLIC PANEL
ALT: 15 U/L (ref 0–44)
AST: 13 U/L — ABNORMAL LOW (ref 15–41)
Albumin: 3.8 g/dL (ref 3.5–5.0)
Alkaline Phosphatase: 99 U/L (ref 38–126)
Anion gap: 8 (ref 5–15)
BUN: 17 mg/dL (ref 8–23)
CO2: 25 mmol/L (ref 22–32)
Calcium: 8.9 mg/dL (ref 8.9–10.3)
Chloride: 105 mmol/L (ref 98–111)
Creatinine, Ser: 1.58 mg/dL — ABNORMAL HIGH (ref 0.61–1.24)
GFR, Estimated: 47 mL/min — ABNORMAL LOW (ref >60)
Glucose, Bld: 97 mg/dL (ref 70–99)
Potassium: 4.3 mmol/L (ref 3.5–5.1)
Sodium: 138 mmol/L (ref 135–145)
Total Bilirubin: 0.5 mg/dL (ref 0.3–1.2)
Total Protein: 6.6 g/dL (ref 6.5–8.1)

## 2021-02-03 LAB — URINALYSIS, MICROSCOPIC (REFLEX)

## 2021-02-03 LAB — SODIUM, URINE, RANDOM: Sodium, Ur: 79 mmol/L

## 2021-02-03 LAB — D-DIMER, QUANTITATIVE: D-Dimer, Quant: 0.38 ug/mL-FEU (ref 0.00–0.50)

## 2021-02-03 LAB — TROPONIN I (HIGH SENSITIVITY)
Troponin I (High Sensitivity): 3 ng/L (ref <18)
Troponin I (High Sensitivity): 4 ng/L (ref <18)

## 2021-02-03 LAB — RESP PANEL BY RT-PCR (FLU A&B, COVID) ARPGX2
Influenza A by PCR: NEGATIVE
Influenza B by PCR: NEGATIVE
SARS Coronavirus 2 by RT PCR: NEGATIVE

## 2021-02-03 LAB — CREATININE, URINE, RANDOM: Creatinine, Urine: 77.91 mg/dL

## 2021-02-03 MED ORDER — ACETAMINOPHEN 325 MG PO TABS: 650.0000 mg | ORAL_TABLET | Freq: Four times a day (QID) | ORAL | Status: DC | PRN

## 2021-02-03 MED ORDER — TRAZODONE HCL 50 MG PO TABS: 25.0000 mg | ORAL_TABLET | Freq: Every evening | ORAL | Status: DC | PRN

## 2021-02-03 MED ORDER — PANTOPRAZOLE SODIUM 40 MG PO TBEC
40.0000 mg | DELAYED_RELEASE_TABLET | Freq: Every day | ORAL | Status: DC
  Administered 2021-02-04: 40 mg via ORAL
  Filled 2021-02-03: qty 1

## 2021-02-03 MED ORDER — ACETAMINOPHEN 650 MG RE SUPP: 650.0000 mg | Freq: Four times a day (QID) | RECTAL | Status: DC | PRN

## 2021-02-03 MED ORDER — ENOXAPARIN SODIUM 40 MG/0.4ML IJ SOSY
40.0000 mg | PREFILLED_SYRINGE | INTRAMUSCULAR | Status: DC
  Administered 2021-02-04: 40 mg via SUBCUTANEOUS
  Filled 2021-02-03: qty 0.4

## 2021-02-03 MED ORDER — SODIUM CHLORIDE 0.9 % IV SOLN: INTRAVENOUS | Status: DC

## 2021-02-03 MED ORDER — ONDANSETRON HCL 4 MG/2ML IJ SOLN: 4.0000 mg | Freq: Four times a day (QID) | INTRAMUSCULAR | Status: DC | PRN

## 2021-02-03 MED ORDER — SODIUM CHLORIDE 0.9 % IV BOLUS
1000.0000 mL | Freq: Once | INTRAVENOUS | Status: AC
  Administered 2021-02-03: 1000 mL via INTRAVENOUS

## 2021-02-03 MED ORDER — MAGNESIUM HYDROXIDE 400 MG/5ML PO SUSP: 30.0000 mL | Freq: Every day | ORAL | Status: DC | PRN

## 2021-02-03 MED ORDER — ONDANSETRON HCL 4 MG PO TABS: 4.0000 mg | ORAL_TABLET | Freq: Four times a day (QID) | ORAL | Status: DC | PRN

## 2021-02-03 NOTE — ED Provider Notes (Signed)
[facility] EMERGENCY DEPARTMENT Provider Note   CSN: [id number] Arrival date & time: 02/03/21  1214     History Chief Complaint  Patient presents with   Irregular Heart Beat    Wesley Bright is a 69 y.o. male with a past medical history significant for CLL status post immunotherapy currently in remission, hypertension, chronic constipation, and GERD who presents to the ED after 2 syncopal episodes last week (Tuesday and Thursday).  Patient states syncopal episodes are preceded by shortness of breath and chest pressure.  He also endorses headaches prior to the syncopal episodes.  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  Episodes were witnessed. He said he was told he loss consciousness for roughly 2 minutes.  No convulsions.  No urinary incontinence or mouth trauma.  Denies history of seizures.  Patient also states he had a syncopal episode in July.  No cardiac history.  Patient denies current chest pain or shortness of breath.  Patient was evaluated by PCP prior to arrival and sent to the ED due to abnormal EKG. He also endorses chronic constipation and takes frequent MiraLAX and Ex-Lax.  Last bowel movement was today.  Denies abdominal pain. No treatment prior to arrival. No aggravating or alleviating factors.   History obtained from patient and past medical records. No interpreter used during encounter.       Past Medical History:  Diagnosis Date   External hemorrhoid    Hypertension    Leukemia ([place]) 2018    Patient Active Problem List   Diagnosis Date Noted   Syncope 02/03/2021   Hx of adenomatous colonic polyps 04/09/2020   Abdominal distension 04/09/2020   Bloating 04/09/2020   History of duodenitis 04/09/2020   Counseling regarding advance care planning and goals of care 01/08/2020   Skin rash 10/03/2017   CLL (chronic lymphocytic leukemia) ([place]) 12/09/2016   General weakness 12/07/2016    Smoker 12/07/2016   Loss of weight 12/07/2016   GERD (gastroesophageal reflux disease) 12/30/2010   Chronic constipation 12/30/2010   Hemorrhoids, external 12/30/2010    Past Surgical History:  Procedure Laterality Date   APPENDECTOMY     INGUINAL HERNIA REPAIR     left   KNEE SURGERY     Left Knee       Family History  Problem Relation Age of Onset   Liver cancer Brother    Cancer Brother        Liver   Esophageal cancer Father    Cancer Father        Throat    Cancer Mother        Bladder - cause of death   Pancreatic cancer Maternal Uncle    Pancreatic cancer Paternal Uncle    Stomach cancer Maternal Uncle    Colon cancer Neg Hx    Inflammatory bowel disease Neg Hx    Rectal cancer Neg Hx     Social History   Tobacco Use   Smoking status: Every Day    Packs/day: 0.50    Years: 50.00    Pack years: 25.00    Types: Cigarettes   Smokeless tobacco: Never  Vaping Use   Vaping Use: Never used  Substance Use Topics   Alcohol use: Yes    Comment: occasional   Drug use: No    Home Medications Prior to Admission medications   Medication Sig Start Date End Date Taking? Authorizing Provider  amLODipine (NORVASC)  5 MG tablet Take 5 mg by mouth daily.    [provider]  cholecalciferol (VITAMIN D) 1000 units tablet Take 1,000 Units by mouth daily.    [provider]  co-enzyme Q-10 30 MG capsule Take 30 mg by mouth 3 (three) times daily.    [provider]  omeprazole (PRILOSEC) 40 MG capsule Take 1 capsule (40 mg total) by mouth in the morning and at bedtime. 05/30/20   Mansouraty, Telford Nab., MD  rosuvastatin (CRESTOR) 10 MG tablet Take 1 tablet (10 mg total) by mouth daily. 07/02/20   Just, Laurita Quint, FNP  vitamin B-12 (CYANOCOBALAMIN) 100 MCG tablet Take 100 mcg by mouth daily.    [provider]    Allergies    Patient has no known allergies.  Review of Systems   Review of Systems  Constitutional:  Negative for chills  and fever.  Respiratory:  Positive for shortness of breath.   Cardiovascular:  Positive for chest pain. Negative for leg swelling.  Gastrointestinal:  Positive for constipation. Negative for abdominal pain, nausea and vomiting.  Neurological:  Positive for syncope and headaches.  All other systems reviewed and are negative.  Physical Exam Updated Vital Signs BP 136/87   Pulse (!) 52   Temp 98.8 F (37.1 C)   Resp 19   Ht 6' (1.829 m)   Wt 82.1 kg   SpO2 97%   BMI 24.55 kg/m   Physical Exam Vitals and nursing note reviewed.  Constitutional:      General: He is not in acute distress.    Appearance: He is not ill-appearing.  HENT:     Head: Normocephalic.  Eyes:     Pupils: Pupils are equal, round, and reactive to light.  Cardiovascular:     Rate and Rhythm: Normal rate and regular rhythm.     Pulses: Normal pulses.     Heart sounds: Normal heart sounds. No murmur heard.   No friction rub. No gallop.  Pulmonary:     Effort: Pulmonary effort is normal.     Breath sounds: Normal breath sounds.  Abdominal:     General: Abdomen is flat. There is no distension.     Palpations: Abdomen is soft.     Tenderness: There is no abdominal tenderness. There is no guarding or rebound.  Musculoskeletal:        General: Normal range of motion.     Cervical back: Neck supple.  Skin:    General: Skin is warm and dry.  Neurological:     General: No focal deficit present.     Mental Status: He is alert.  Psychiatric:        Mood and Affect: Mood normal.        Behavior: Behavior normal.    ED Results / Procedures / Treatments   Labs (all labs ordered are listed, but only abnormal results are displayed) Labs Reviewed  CBC WITH DIFFERENTIAL/PLATELET - Abnormal; Notable for the following components:      Result Value   Platelets 143 (*)    All other components within normal limits  COMPREHENSIVE METABOLIC PANEL - Abnormal; Notable for the following components:   Creatinine, Ser  1.58 (*)    AST 13 (*)    GFR, Estimated 47 (*)    All other components within normal limits  RESP PANEL BY RT-PCR (FLU A&B, COVID) ARPGX2  URINE CULTURE  D-DIMER, QUANTITATIVE  URINALYSIS, ROUTINE W REFLEX MICROSCOPIC  CREATININE, URINE, RANDOM  SODIUM, URINE,  RANDOM  TROPONIN I (HIGH SENSITIVITY)  TROPONIN I (HIGH SENSITIVITY)    EKG None  Radiology DG Chest Portable 1 View  Result Date: 02/03/2021 CLINICAL DATA:  Shortness of breath. Additional history provided: Fainting episode, irregular EKG at PCPs office. EXAM: PORTABLE CHEST 1 VIEW COMPARISON:  Radiographs 09/06/2020 and earlier. FINDINGS: A small portion of the left lateral costophrenic angle is excluded from the field of view. Heart size within normal limits. No appreciable airspace consolidation or pulmonary edema. No evidence of pleural effusion or pneumothorax. No acute bony abnormality identified. IMPRESSION: A small portion of the left lateral costophrenic angle is excluded from the field of view. No evidence of active cardiopulmonary disease. Electronically Signed   By: Kellie Simmering D.O.   On: 02/03/2021 14:18    Procedures Procedures   Medications Ordered in ED Medications  sodium chloride 0.9 % bolus 1,000 mL (0 mLs Intravenous Stopped 02/03/21 1513)    ED Course  I have reviewed the triage vital signs and the nursing notes.  Pertinent labs & imaging results that were available during my care of the patient were reviewed by me and considered in my medical decision making (see chart for details).    MDM Rules/Calculators/A&P                           70 year old male presents to the ED due to 2 syncopal episodes last week which were both exertional and preceded by chest pain and shortness of breath.  No cardiac history.  Upon arrival, patient afebrile, bradycardic with otherwise reassuring vitals.  Patient in no acute distress.  Reassuring physical exam.  Cardiac labs ordered.  D-dimer to rule out PE. Routine  labs. Discussed case with Dr. Doren Custard who evaluated patient at bedside and agrees with assessment and plan.  CBC significant for thrombocytopenia at 143.  Normal hemoglobin.  No leukocytosis.  CMP significant for elevated creatinine 1.58.  No major electrolyte derangements.  Initial troponin normal at 3.  D-dimer normal.  Low suspicion for PE/DVT.  IV fluids given.  EKG demonstrates sinus rhythm with PACs.  No signs of acute ischemia.  Chest x-ray personally reviewed which is negative for signs of pneumonia, pneumothorax, or widened mediastinum.  Given concerning story of exertional syncope preceded by chest pain and shortness of breath, patient will require admission for syncope of unknown etiology.  COVID test ordered.  Discussed with Dr. Grandville Silos with [facility] who agrees to admit patient for further work-up.  Final Clinical Impression(s) / ED Diagnoses Final diagnoses:  Syncope, unspecified syncope type    Rx / DC Orders ED Discharge Orders     None        Karie Kirks 02/03/21 1722    Godfrey Pick, MD 02/03/21 [phone]

## 2021-02-03 NOTE — ED Triage Notes (Signed)
Was @ PCP for irregular EKG, pt @ PCP due to fainting episodes.

## 2021-02-03 NOTE — Plan of Care (Signed)
  Problem: Education: Goal: Knowledge of General Education information will improve Description: Including pain rating scale, medication(s)/side effects and non-pharmacologic comfort measures Outcome: Progressing   Problem: Activity: Goal: Risk for activity intolerance will decrease Outcome: Progressing   Problem: Coping: Goal: Level of anxiety will decrease Outcome: Progressing   

## 2021-02-03 NOTE — Care Plan (Signed)
24Patient 70 year old gentleman history of CLL status post immunotherapy in remission, hypertension, chronic constipation, GERD presenting to the ED with 2 syncopal episodes prior to admission on Tuesday and on Thursday.  Syncopal episodes were preceded by chest pressure and shortness of breath lasted about 2 minutes with no convulsions noted, no bowel or urinary incontinence.  Patient saw PCP noted to have abnormal EKG and sent to the ED.  In the ED patient noted to be bradycardic with heart rates in the 50s with some PACs and PVCs noted.  Hospitalist were called for admission.  Patient accepted under observation on telemetry bed.  Will likely require cardiology input.  No charge.

## 2021-02-03 NOTE — H&P (Signed)
[place]   PATIENT NAME: Wesley Bright    MR#:  [id number]  DATE OF BIRTH:  02-13-1951  DATE OF ADMISSION:  02/03/2021  PRIMARY CARE PHYSICIAN: [name], [place], [place]   Patient is coming from: Home  REQUESTING/REFERRING PHYSICIAN: Suzy Bouchard, PA-C  CHIEF COMPLAINT:   Chief Complaint  Patient presents with   Irregular Heart Beat  Chest pressure and syncope  HISTORY OF PRESENT ILLNESS:  Wesley Bright is a 70 y.o. Caucasian male with medical history significant for hypertension and CLL status post immunotherapy in remission, GERD, dyslipidemia and chronic constipation, who presented to the emergency room with acute onset of 2 episodes of syncope that were preceded by chest pain felt as pressure and dyspnea that lasted about a couple of minutes.  No tongue bites, urinary or stool incontinence.  No witnessed seizures.  No paresthesias or focal muscle weakness.  While in the emergency room the patient was then having bradycardia with rates in the 50s with PACs and PVCs noted.  The patient stated that on Friday he was working out without any difficulty.  He works part-time on Tuesday and Friday.  He was sitting today and got up before he had syncope.    ED Course: Upon presentation to the emergency room, blood pressure was 152/82 with heart rate of 54 with otherwise normal vital signs.  Creatinine was 1.58 compared to 1.82 on 01/06/2021 and compared to 1.29 on 09/06/2020.  High-sensitivity troponin I was 3 and later 4.  CBC showed mild thrombocytopenia of 143.  Influenza antigens and COVID-19 PCR came back negative.  UA was unremarkable. EKG as reviewed by me : showed normal sinus rhythm with a rate 70 with PACs. Imaging: Portable chest ray showed no acute cardiopulmonary disease.  The patient was given 40 mg of p.o. Protonix and 1 L bolus of IV normal saline bolus.  He will be admitted to an observation telemetry bed for further evaluation and management.   PAST MEDICAL  HISTORY:   Past Medical History:  Diagnosis Date   External hemorrhoid    Hypertension    Leukemia ([place]) 2018    PAST SURGICAL HISTORY:   Past Surgical History:  Procedure Laterality Date   APPENDECTOMY     INGUINAL HERNIA REPAIR     left   KNEE SURGERY     Left Knee    SOCIAL HISTORY:   Social History   Tobacco Use   Smoking status: Every Day    Packs/day: 0.50    Years: 50.00    Pack years: 25.00    Types: Cigarettes   Smokeless tobacco: Never  Substance Use Topics   Alcohol use: Yes    Comment: occasional    FAMILY HISTORY:   Family History  Problem Relation Age of Onset   Liver cancer Brother    Cancer Brother        Liver   Esophageal cancer Father    Cancer Father        Throat    Cancer Mother        Bladder - cause of death   Pancreatic cancer Maternal Uncle    Pancreatic cancer Paternal Uncle    Stomach cancer Maternal Uncle    Colon cancer Neg Hx    Inflammatory bowel disease Neg Hx    Rectal cancer Neg Hx     DRUG ALLERGIES:  No Known Allergies  REVIEW OF SYSTEMS:   ROS As per history of present illness.  All pertinent systems were reviewed above. Constitutional, HEENT, cardiovascular, respiratory, GI, GU, musculoskeletal, neuro, psychiatric, endocrine, integumentary and hematologic systems were reviewed and are otherwise negative/unremarkable except for positive findings mentioned above in the HPI.   MEDICATIONS AT HOME:   Prior to Admission medications   Medication Sig Start Date End Date Taking? Authorizing Provider  amLODipine (NORVASC) 5 MG tablet Take 5 mg by mouth daily.    [provider]  cholecalciferol (VITAMIN D) 1000 units tablet Take 1,000 Units by mouth daily.    [provider]  co-enzyme Q-10 30 MG capsule Take 30 mg by mouth 3 (three) times daily.    [provider]  omeprazole (PRILOSEC) 40 MG capsule Take 1 capsule (40 mg total) by mouth in the morning and at bedtime. 05/30/20   Mansouraty,  Telford Nab., MD  rosuvastatin (CRESTOR) 10 MG tablet Take 1 tablet (10 mg total) by mouth daily. 07/02/20   Just, Laurita Quint, FNP  vitamin B-12 (CYANOCOBALAMIN) 100 MCG tablet Take 100 mcg by mouth daily.    [provider]      VITAL SIGNS:  Blood pressure (!) 141/83, pulse (!) 50, temperature 98.2 F (36.8 C), temperature source Oral, resp. rate 16, height 6' (1.829 m), weight 82.1 kg, SpO2 100 %.  PHYSICAL EXAMINATION:  Physical Exam  GENERAL:  70 y.o.-year-old Caucasian patient lying in the bed with no acute distress.  EYES: Pupils equal, round, reactive to light and accommodation. No scleral icterus. Extraocular muscles intact.  HEENT: Head atraumatic, normocephalic. Oropharynx and nasopharynx clear.  NECK:  Supple, no jugular venous distention. No thyroid enlargement, no tenderness.  LUNGS: Normal breath sounds bilaterally, no wheezing, rales,rhonchi or crepitation. No use of accessory muscles of respiration.  CARDIOVASCULAR: Regular rate and rhythm, S1, S2 normal. No murmurs, rubs, or gallops.  ABDOMEN: Soft, nondistended, nontender. Bowel sounds present. No organomegaly or mass.  EXTREMITIES: No pedal edema, cyanosis, or clubbing.  NEUROLOGIC: Cranial nerves II through XII are intact. Muscle strength 5/5 in all extremities. Sensation intact. Gait not checked.  PSYCHIATRIC: The patient is alert and oriented x 3.  Normal affect and good eye contact. SKIN: No obvious rash, lesion, or ulcer.   LABORATORY PANEL:   CBC Recent Labs  Lab 02/03/21 1335  WBC 6.5  HGB 16.5  HCT 49.1  PLT 143*   ------------------------------------------------------------------------------------------------------------------  Chemistries  Recent Labs  Lab 02/03/21 1335  NA 138  K 4.3  CL 105  CO2 25  GLUCOSE 97  BUN 17  CREATININE 1.58*  CALCIUM 8.9  AST 13*  ALT 15  ALKPHOS 99  BILITOT 0.5    ------------------------------------------------------------------------------------------------------------------  Cardiac Enzymes No results for input(s): TROPONINI in the last 168 hours. ------------------------------------------------------------------------------------------------------------------  RADIOLOGY:  DG Chest Portable 1 View  Result Date: 02/03/2021 CLINICAL DATA:  Shortness of breath. Additional history provided: Fainting episode, irregular EKG at PCPs office. EXAM: PORTABLE CHEST 1 VIEW COMPARISON:  Radiographs 09/06/2020 and earlier. FINDINGS: A small portion of the left lateral costophrenic angle is excluded from the field of view. Heart size within normal limits. No appreciable airspace consolidation or pulmonary edema. No evidence of pleural effusion or pneumothorax. No acute bony abnormality identified. IMPRESSION: A small portion of the left lateral costophrenic angle is excluded from the field of view. No evidence of active cardiopulmonary disease. Electronically Signed   By: Kellie Simmering D.O.   On: 02/03/2021 14:18      IMPRESSION AND PLAN:  Active Problems:   Syncope  1.  Syncope with associated syncope. - The patient will be admitted to an observation medically monitored bed. - Will check orthostatics q 12 hours. - Will obtain a bilateral carotid Doppler and 2D echo. - Cardiology consult will be obtained. - I discussed the case with Dr. Einar Gip. - The patient will be gently hydrated with IV normal saline and monitored for arrhythmias. Differential diagnosis would include symptomatic bradycardia, orthostatic hypotension, neurally mediated syncope, cardiogenic, other arrhythmias related  and less likely hypoglycemia.  2.  Acute kidney injury. - This is likely prerenal possibly secondary to volume depletion. - The patient will be hydrated with IV normal saline and will follow BMP. - We will hold off nephrotoxins.  3.  Essential hypertension. - We will  continue Norvasc.  4.  Dyslipidemia. - We will continue statin therapy.  5.  GERD. - We will continue PPI therapy.  6.  BPH. - We will continue Flomax.  7.  CLL status post immunotherapy in remission. - We will follow his CBC.  8.  Vitamin B12 and vitamin D deficiency. - We will continue vitamin B12 and vitamin D.  DVT prophylaxis: Lovenox.  Code Status: full code. Family Communication:  The plan of care was discussed in details with the patient (and family). I answered all questions. The patient agreed to proceed with the above mentioned plan. Further management will depend upon hospital course. Disposition Plan: Back to previous home environment Consults called: Cardiology All the records are reviewed and case discussed with ED provider.  Status is: Observation  The patient remains OBS appropriate and will d/c before 2 midnights.  Dispo: The patient is from: Home              Anticipated d/c is to: Home              Patient currently is not medically stable to d/c.   Difficult to place patient No   TOTAL TIME TAKING CARE OF THIS PATIENT: 55 minutes.    Christel Mormon M.D on 02/03/2021 at 10:49 PM  [facility]   From 7 PM-7 AM, contact night-coverage [email or web address]  CC: Primary care physician; [place], [place], PA-C

## 2021-02-04 ENCOUNTER — Observation Stay (HOSPITAL_BASED_OUTPATIENT_CLINIC_OR_DEPARTMENT_OTHER): Payer: PPO

## 2021-02-04 ENCOUNTER — Encounter: Payer: Self-pay | Admitting: Hematology

## 2021-02-04 ENCOUNTER — Other Ambulatory Visit: Payer: Self-pay | Admitting: Cardiology

## 2021-02-04 DIAGNOSIS — R55 Syncope and collapse: Secondary | ICD-10-CM | POA: Diagnosis not present

## 2021-02-04 DIAGNOSIS — R0609 Other forms of dyspnea: Secondary | ICD-10-CM

## 2021-02-04 DIAGNOSIS — R0600 Dyspnea, unspecified: Secondary | ICD-10-CM

## 2021-02-04 LAB — CBC
HCT: 45.7 % (ref 39.0–52.0)
Hemoglobin: 15.1 g/dL (ref 13.0–17.0)
MCH: 29.9 pg (ref 26.0–34.0)
MCHC: 33 g/dL (ref 30.0–36.0)
MCV: 90.5 fL (ref 80.0–100.0)
Platelets: 117 10*3/uL — ABNORMAL LOW (ref 150–400)
RBC: 5.05 MIL/uL (ref 4.22–5.81)
RDW: 13.5 % (ref 11.5–15.5)
WBC: 5.3 10*3/uL (ref 4.0–10.5)
nRBC: 0 % (ref 0.0–0.2)

## 2021-02-04 LAB — HIV ANTIBODY (ROUTINE TESTING W REFLEX): HIV Screen 4th Generation wRfx: NONREACTIVE

## 2021-02-04 LAB — BASIC METABOLIC PANEL
Anion gap: 7 (ref 5–15)
BUN: 16 mg/dL (ref 8–23)
CO2: 23 mmol/L (ref 22–32)
Calcium: 8.4 mg/dL — ABNORMAL LOW (ref 8.9–10.3)
Chloride: 109 mmol/L (ref 98–111)
Creatinine, Ser: 1.2 mg/dL (ref 0.61–1.24)
GFR, Estimated: >60 mL/min (ref >60)
Glucose, Bld: 92 mg/dL (ref 70–99)
Potassium: 3.8 mmol/L (ref 3.5–5.1)
Sodium: 139 mmol/L (ref 135–145)

## 2021-02-04 LAB — ECHOCARDIOGRAM COMPLETE
AR max vel: 6.33 cm2
AV Area VTI: 6.33 cm2
AV Area mean vel: 6.05 cm2
AV Mean grad: 4 mmHg
AV Peak grad: 7.1 mmHg
Ao pk vel: 1.34 m/s
Area-P 1/2: 2.04 cm2
Height: 72 in
MV M vel: 3.11 m/s
MV Peak grad: 38.7 mmHg
S' Lateral: 3 cm
Single Plane A4C EF: 68.6 %
Weight: 2896 oz

## 2021-02-04 LAB — TROPONIN I (HIGH SENSITIVITY): Troponin I (High Sensitivity): 5 ng/L (ref <18)

## 2021-02-04 NOTE — Discharge Summary (Signed)
Physician Discharge Summary  Wesley Bright CBS:[id number] DOB: [birth year] DOA: 02/03/2021  PCP: Elisabeth Cara, PA-C  Admit date: 02/03/2021 Discharge date: 02/04/2021  Admitted From: Home Discharge disposition: Home   Code Status: Full Code   Discharge Diagnosis:   Active Problems:   Syncope   Chief Complaint  Patient presents with   Irregular Heart Beat    Brief narrative: Wesley Bright is a 70 y.o. male with PMH significant for HTN, dyslipidemia and CLL s/p immunotherapy, GERD, chronic constipation. Patient presented to the ED on 9/19 with an episode of syncope few days prior and intermittent episodes of chest pain, dyspnea, palpitations.   Patient is originally from [place], works at the [employer] at home.  His work duties include setting up tables etc.  For last 1 week, patient was experiencing severe air hunger and dyspnea with minimal physical activity.  On Thursday 9/15, he had an episode of syncope while bending forward.  He was out for about a minute.  Next day on 9/16, he had a near syncopal episode.   He went to see his primary care provider yesterday 9/19 and was subsequently sent to the ED for further evaluation. No weakness seizures, bowel or bladder incontinence, tongue bites, focal neurological deficits.  In the ED, patient was noted to be bradycardic in the 50s with PACs and PVCs.  Blood pressure 152/82. Labs with creatinine 1.58 well within baseline.  Troponin normal. EKG with normal sinus rhythm with heart rate at 70 with PACs. Chest x-ray unremarkable Kept in observation under hospitalist service.  Subjective: Patient was seen and examined this morning.  Pleasant elderly Caucasian male. Propped up in bed.  Not in distress.  No new symptoms.  Daughter at bedside. Chart reviewed.  Heart rate in 40s and 50s overnight. Labs unremarkable.  Actually creatinine looks better today.  Assessment/Plan: Exertional syncope -Patient complains of several  months history of intermittent dyspnea, chest pain, palpitation and near syncopal episodes.  5 days ago, he had true syncopal episode.   -On admission, he was noted to have creatinine elevated to 1.58.  Telemetry monitoring showed persistent bradycardia overnight to 40s and 50s.   -Cardiology consultation was obtained.  Patient probably has a combination of dehydration, bradycardia, but also need to rule out ischemic heart disease as the cause.  -Troponin normal.  EF 65 to 70% with no wall motion abnormality.  Cardiology plans to do a stress test as an outpatient on 9/26. -With IV hydration, creatinine improved on repeat labs today. -Ultrasound duplex of carotid arteries did not show any significant finding.  CKD 3b -Creatinine remains within baseline. Recent Labs    02/12/20 1018 02/19/20 0958 04/04/20 1140 07/01/20 1029 07/08/20 1142 09/06/20 1906 01/06/21 1338 02/03/21 1335 02/04/21 0339  BUN 18 18 15 14 16 18 15 17 16   CREATININE 1.55* 1.63* 1.51* 1.54* 1.54* 1.29* 1.82* 1.58* 1.20   Essential hypertension. -continue Norvasc.  Dyslipidemia. -continue statin.  GERD. -continue PPI therapy.  BPH. -continue Flomax.  CLL sp immunotherapy in remission Thrombocytopenia -Platelets slightly low.  Continue to monitor.  Vitamin B12 and vitamin D deficiency -continue vitamin B12 and vitamin D   Allergies as of 02/04/2021   No Known Allergies      Medication List     TAKE these medications    amLODipine 5 MG tablet Commonly known as: NORVASC Take 5 mg by mouth daily.   cholecalciferol 1000 units tablet Commonly known as: VITAMIN D Take 1,000 Units by mouth  daily.   co-enzyme Q-10 30 MG capsule Take 30 mg by mouth 3 (three) times daily.   omeprazole 40 MG capsule Commonly known as: PRILOSEC Take 1 capsule (40 mg total) by mouth in the morning and at bedtime.   rosuvastatin 10 MG tablet Commonly known as: Crestor Take 1 tablet (10 mg total) by mouth daily.    tamsulosin 0.4 MG Caps capsule Commonly known as: FLOMAX Take 0.4 mg by mouth at bedtime.   vitamin B-12 100 MCG tablet Commonly known as: CYANOCOBALAMIN Take 100 mcg by mouth daily.        Discharge Instructions:  Diet Recommendation:  Discharge Diet Orders (From admission, onward)     Start     Ordered   02/04/21 0000  Diet - low sodium heart healthy        02/04/21 1437              Follow with Primary MD [name], [place], PA-C in 7 days   Get CBC/BMP checked in next visit within 1 week by PCP or SNF MD ( we routinely change or add medications that can affect your baseline labs and fluid status, therefore we recommend that you get the mentioned basic workup next visit with your PCP, your PCP may decide not to get them or add new tests based on their clinical decision)  On your next visit with your PCP, please Get Medicines reviewed and adjusted.  Please request your PCP  to go over all Hospital Tests and Procedure/Radiological results at the follow up, please get all Hospital records sent to your Prim MD by signing hospital release before you go home.  Activity: As tolerated with Full fall precautions use walker/cane & assistance as needed  For Heart failure patients - Check your Weight same time everyday, if you gain over 2 pounds, or you develop in leg swelling, experience more shortness of breath or chest pain, call your Primary MD immediately. Follow Cardiac Low Salt Diet and 1.5 lit/day fluid restriction.  If you have smoked or chewed Tobacco in the last 2 yrs please stop smoking, stop any regular Alcohol  and or any Recreational drug use.  If you experience worsening of your admission symptoms, develop shortness of breath, life threatening emergency, suicidal or homicidal thoughts you must seek medical attention immediately by calling 911 or calling your MD immediately  if symptoms less severe.  You Must read complete instructions/literature along with all  the possible adverse reactions/side effects for all the Medicines you take and that have been prescribed to you. Take any new Medicines after you have completely understood and accpet all the possible adverse reactions/side effects.   Do not drive, operate heavy machinery, perform activities at heights, swimming or participation in water activities or provide baby sitting services if your were admitted for syncope or siezures until you have seen by Primary MD or a Neurologist and advised to do so again.  Do not drive when taking Pain medications.  Do not take more than prescribed Pain, Sleep and Anxiety Medications  Wear Seat belts while driving.   Please note You were cared for by a hospitalist during your hospital stay. If you have any questions about your discharge medications or the care you received while you were in the hospital after you are discharged, you can call the unit and asked to speak with the hospitalist on call if the hospitalist that took care of you is not available. Once you are discharged, your primary care  physician will handle any further medical issues. Please note that NO REFILLS for any discharge medications will be authorized once you are discharged, as it is imperative that you return to your primary care physician (or establish a relationship with a primary care physician if you do not have one) for your aftercare needs so that they can reassess your need for medications and monitor your lab values.    Follow ups:    Follow-up Information     Sperry, [place], PA-C Follow up.   Specialty: Family Medicine Contact information: [address] [place] [address] [phone]                 Wound care:   Incision (Closed) 06/20/19 Back Lateral;Left (Active)  Date First Assessed/Time First Assessed: 06/20/19 0900   Location: Back  Location Orientation: Lateral;Left  Present on Admission: No    Assessments 06/20/2019  9:00 AM  Dressing  Type Adhesive bandage  Dressing Clean;Dry;Intact  Dressing Change Frequency Daily  Site / Wound Assessment Clean;Dry  Margins Attached edges (approximated)  Closure None  Drainage Amount None     No Linked orders to display    Discharge Exam:   Vitals:   02/03/21 2208 02/03/21 2217 02/04/21 0210 02/04/21 0814  BP: (!) 142/98 (!) 141/83 128/79 138/79  Pulse: 63 (!) 50 (!) 56 (!) 58  Resp: 16 16 15 16   Temp:   98.4 F (36.9 C) 97.9 F (36.6 C)  TempSrc:   Oral Oral  SpO2: 98% 100% 94% 94%  Weight:      Height:        Body mass index is 24.55 kg/m.  General exam: Pleasant, elderly male.  Not in distress Skin: No rashes, lesions or ulcers. HEENT: Atraumatic, normocephalic, no obvious bleeding Lungs: Clear to auscultation bilaterally CVS: Regular rate and rhythm, no murmur GI/Abd soft, nontender, nondistended, bowel sound present CNS: Alert, awake, oriented x3 Psychiatry: Mood appropriate Extremities: No pedal edema, no calf tenderness  Time coordinating discharge: 35 minutes   The results of significant diagnostics from this hospitalization (including imaging, microbiology, ancillary and laboratory) are listed below for reference.    Procedures and Diagnostic Studies:   DG Chest Portable 1 View  Result Date: 02/03/2021 CLINICAL DATA:  Shortness of breath. Additional history provided: Fainting episode, irregular EKG at PCPs office. EXAM: PORTABLE CHEST 1 VIEW COMPARISON:  Radiographs 09/06/2020 and earlier. FINDINGS: A small portion of the left lateral costophrenic angle is excluded from the field of view. Heart size within normal limits. No appreciable airspace consolidation or pulmonary edema. No evidence of pleural effusion or pneumothorax. No acute bony abnormality identified. IMPRESSION: A small portion of the left lateral costophrenic angle is excluded from the field of view. No evidence of active cardiopulmonary disease. Electronically Signed   By: Kellie Simmering D.O.    On: 02/03/2021 14:18   ECHOCARDIOGRAM COMPLETE  Result Date: 02/04/2021    ECHOCARDIOGRAM REPORT   Patient Name:   KOLSON CHOVANEC Date of Exam: 02/04/2021 Medical Rec #:  [id number]   Height:       72.0 in Accession #:    [id number]  Weight:       181.0 lb Date of Birth:  10/01/1950   BSA:          2.042 m Patient Age:    52 years    BP:           144/77 mmHg Patient Gender: M  HR:           66 bpm. Exam Location:  Inpatient Procedure: 2D Echo, Cardiac Doppler and Color Doppler Indications:    R55 Syncope  History:        Patient has no prior history of Echocardiogram examinations.  Sonographer:    [place] Referring Phys: [id number] [place]  1. Left ventricular ejection fraction, by estimation, is 65 to 70%. The left ventricle has normal function. The left ventricle has no regional wall motion abnormalities. Left ventricular diastolic function could not be evaluated.  2. Right ventricular systolic function is normal. The right ventricular size is normal. Tricuspid regurgitation signal is inadequate for assessing PA pressure.  3. The mitral valve is normal in structure. Trivial mitral valve regurgitation. No evidence of mitral stenosis.  4. The aortic valve is normal in structure. Aortic valve regurgitation is not visualized. No aortic stenosis is present.  5. Aortic dilatation noted. There is mild dilatation of the ascending aorta, measuring 38 mm.  6. The inferior vena cava is normal in size with greater than 50% respiratory variability, suggesting right atrial pressure of 3 mmHg. FINDINGS  Left Ventricle: Left ventricular ejection fraction, by estimation, is 65 to 70%. The left ventricle has normal function. The left ventricle has no regional wall motion abnormalities. The left ventricular internal cavity size was normal in size. There is  no left ventricular hypertrophy. Left ventricular diastolic function could not be evaluated. Right Ventricle: The right ventricular size is normal. No increase  in right ventricular wall thickness. Right ventricular systolic function is normal. Tricuspid regurgitation signal is inadequate for assessing PA pressure. Left Atrium: Left atrial size was normal in size. Right Atrium: Right atrial size was normal in size. Pericardium: There is no evidence of pericardial effusion. Mitral Valve: The mitral valve is normal in structure. Trivial mitral valve regurgitation. No evidence of mitral valve stenosis. Tricuspid Valve: The tricuspid valve is normal in structure. Tricuspid valve regurgitation is not demonstrated. No evidence of tricuspid stenosis. Aortic Valve: The aortic valve is normal in structure. Aortic valve regurgitation is not visualized. No aortic stenosis is present. Aortic valve mean gradient measures 4.0 mmHg. Aortic valve peak gradient measures 7.1 mmHg. Aortic valve area, by VTI measures 6.33 cm. Pulmonic Valve: The pulmonic valve was normal in structure. Pulmonic valve regurgitation is not visualized. No evidence of pulmonic stenosis. Aorta: Aortic dilatation noted. There is mild dilatation of the ascending aorta, measuring 38 mm. Venous: The inferior vena cava is normal in size with greater than 50% respiratory variability, suggesting right atrial pressure of 3 mmHg. IAS/Shunts: No atrial level shunt detected by color flow Doppler.  LEFT VENTRICLE PLAX 2D LVIDd:         4.80 cm     Diastology LVIDs:         3.00 cm     LV e' medial:  5.98 cm/s LV PW:         1.00 cm     LV e' lateral: 9.14 cm/s LV IVS:        0.90 cm LVOT diam:     2.90 cm LV SV:         172 LV SV Index:   84 LVOT Area:     6.61 cm  LV Volumes (MOD) LV vol d, MOD A4C: 53.2 ml LV vol s, MOD A4C: 16.7 ml LV SV MOD A4C:     53.2 ml RIGHT VENTRICLE  IVC RV S prime:     15.20 cm/s  IVC diam: 1.80 cm TAPSE (M-mode): 2.5 cm LEFT ATRIUM             Index       RIGHT ATRIUM           Index LA diam:        2.10 cm 1.03 cm/m  RA Area:     13.40 cm LA Vol (A2C):   26.4 ml 12.93 ml/m RA  Volume:   26.60 ml  13.03 ml/m LA Vol (A4C):   18.1 ml 8.86 ml/m LA Biplane Vol: 22.5 ml 11.02 ml/m  AORTIC VALVE                   PULMONIC VALVE AV Area (Vmax):    6.33 cm    PV Vmax:       0.63 m/s AV Area (Vmean):   6.05 cm    PV Peak grad:  1.6 mmHg AV Area (VTI):     6.33 cm AV Vmax:           133.50 cm/s AV Vmean:          87.600 cm/s AV VTI:            0.272 m AV Peak Grad:      7.1 mmHg AV Mean Grad:      4.0 mmHg LVOT Vmax:         128.00 cm/s LVOT Vmean:        80.200 cm/s LVOT VTI:          0.261 m LVOT/AV VTI ratio: 0.96  AORTA Ao Root diam: 3.60 cm Ao Asc diam:  3.80 cm MITRAL VALVE MV Area (PHT): 2.04 cm    SHUNTS MR Peak grad: 38.7 mmHg    Systemic VTI:  0.26 m MR Vmax:      311.00 cm/s  Systemic Diam: 2.90 cm MV A velocity: 94.30 cm/s Fransico Him MD Electronically signed by Fransico Him MD Signature Date/Time: 02/04/2021/1:39:19 PM    Final    VAS US CAROTID  Result Date: 02/04/2021 Carotid Arterial Duplex Study Patient Name:  Geraldo Haris  Date of Exam:   02/04/2021 Medical Rec #: [id number]    Accession #:    [id number] Date of Birth: [birth year]    Patient Gender: M Patient Age:   13 years Exam Location:  [facility] Procedure:      VAS US CAROTID Referring Phys: Eugenie Norrie --------------------------------------------------------------------------------  Indications:       Syncope. Risk Factors:      Hypertension, current smoker. Comparison Study:  No previous exams Performing Technologist: Jody Hill RVT, RDMS  Examination Guidelines: A complete evaluation includes B-mode imaging, spectral Doppler, color Doppler, and power Doppler as needed of all accessible portions of each vessel. Bilateral testing is considered an integral part of a complete examination. Limited examinations for reoccurring indications may be performed as noted.  Right Carotid Findings: +----------+--------+--------+--------+------------------+--------+           PSV cm/sEDV cm/sStenosisPlaque  DescriptionComments +----------+--------+--------+--------+------------------+--------+ CCA Prox  71      16                                         +----------+--------+--------+--------+------------------+--------+ CCA Distal67      19                                         +----------+--------+--------+--------+------------------+--------+  ICA Prox  52      19                                         +----------+--------+--------+--------+------------------+--------+ ICA Distal63      23                                         +----------+--------+--------+--------+------------------+--------+ ECA       70      13                                         +----------+--------+--------+--------+------------------+--------+ +----------+--------+-------+----------------+-------------------+           PSV cm/sEDV cmsDescribe        Arm Pressure (mmHG) +----------+--------+-------+----------------+-------------------+ [id number]             Multiphasic, WNL                    +----------+--------+-------+----------------+-------------------+ +---------+--------+--+--------+--+---------+ VertebralPSV cm/s51EDV cm/s20Antegrade +---------+--------+--+--------+--+---------+  Left Carotid Findings: +----------+--------+--------+--------+------------------+--------+           PSV cm/sEDV cm/sStenosisPlaque DescriptionComments +----------+--------+--------+--------+------------------+--------+ CCA Prox  74      20                                         +----------+--------+--------+--------+------------------+--------+ CCA Distal87      21                                         +----------+--------+--------+--------+------------------+--------+ ICA Prox  46      18                                         +----------+--------+--------+--------+------------------+--------+ ICA Distal62      24                                          +----------+--------+--------+--------+------------------+--------+ ECA       96      17                                         +----------+--------+--------+--------+------------------+--------+ +----------+--------+--------+----------------+-------------------+           PSV cm/sEDV cm/sDescribe        Arm Pressure (mmHG) +----------+--------+--------+----------------+-------------------+ Subclavian100             Multiphasic, WNL                    +----------+--------+--------+----------------+-------------------+ +---------+--------+--+--------+--+---------+ VertebralPSV cm/s37EDV cm/s11Antegrade +---------+--------+--+--------+--+---------+   Summary: Right Carotid: The extracranial vessels were near-normal with only minimal wall                thickening or plaque. Left Carotid: The extracranial vessels were near-normal  with only minimal wall               thickening or plaque. Vertebrals:  Bilateral vertebral arteries demonstrate antegrade flow. Subclavians: Normal flow hemodynamics were seen in bilateral subclavian              arteries. *See table(s) above for measurements and observations.     Preliminary      Labs:   Basic Metabolic Panel: Recent Labs  Lab 02/03/21 1335 02/04/21 0339  NA 138 139  K 4.3 3.8  CL 105 109  CO2 25 23  GLUCOSE 97 92  BUN 17 16  CREATININE 1.58* 1.20  CALCIUM 8.9 8.4*   GFR Estimated Creatinine Clearance: 64.7 mL/min (by C-G formula based on SCr of 1.2 mg/dL). Liver Function Tests: Recent Labs  Lab 02/03/21 1335  AST 13*  ALT 15  ALKPHOS 99  BILITOT 0.5  PROT 6.6  ALBUMIN 3.8   No results for input(s): LIPASE, AMYLASE in the last 168 hours. No results for input(s): AMMONIA in the last 168 hours. Coagulation profile No results for input(s): INR, PROTIME in the last 168 hours.  CBC: Recent Labs  Lab 02/03/21 1335 02/04/21 0339  WBC 6.5 5.3  NEUTROABS 4.2  --   HGB 16.5 15.1  HCT 49.1 45.7  MCV 89.9 90.5   PLT 143* 117*   Cardiac Enzymes: No results for input(s): CKTOTAL, CKMB, CKMBINDEX, TROPONINI in the last 168 hours. BNP: Invalid input(s): POCBNP CBG: No results for input(s): GLUCAP in the last 168 hours. D-Dimer Recent Labs    02/03/21 1332  DDIMER 0.38   Hgb A1c No results for input(s): HGBA1C in the last 72 hours. Lipid Profile No results for input(s): CHOL, HDL, LDLCALC, TRIG, CHOLHDL, LDLDIRECT in the last 72 hours. Thyroid function studies No results for input(s): TSH, T4TOTAL, T3FREE, THYROIDAB in the last 72 hours.  Invalid input(s): FREET3 Anemia work up No results for input(s): VITAMINB12, FOLATE, FERRITIN, TIBC, IRON, RETICCTPCT in the last 72 hours. Microbiology Recent Results (from the past 240 hour(s))  Resp Panel by RT-PCR (Flu A&B, Covid) Nasopharyngeal Swab     Status: None   Collection Time: 02/03/21  1:32 PM   Specimen: Nasopharyngeal Swab; Nasopharyngeal(NP) swabs in vial transport medium  Result Value Ref Range Status   SARS Coronavirus 2 by RT PCR NEGATIVE NEGATIVE Final    Comment: (NOTE) SARS-CoV-2 target nucleic acids are NOT DETECTED.  The SARS-CoV-2 RNA is generally detectable in upper respiratory specimens during the acute phase of infection. The lowest concentration of SARS-CoV-2 viral copies this assay can detect is 138 copies/mL. A negative result does not preclude SARS-Cov-2 infection and should not be used as the sole basis for treatment or other patient management decisions. A negative result may occur with  improper specimen collection/handling, submission of specimen other than nasopharyngeal swab, presence of viral mutation(s) within the areas targeted by this assay, and inadequate number of viral copies(<138 copies/mL). A negative result must be combined with clinical observations, patient history, and epidemiological information. The expected result is Negative.  Fact Sheet for Patients:   EntrepreneurPulse.com.au  Fact Sheet for Healthcare Providers:  IncredibleEmployment.be  This test is no t yet approved or cleared by the [place] FDA and  has been authorized for detection and/or diagnosis of SARS-CoV-2 by FDA under an Emergency Use Authorization (EUA). This EUA will remain  in effect (meaning this test can be used) for the duration of the COVID-19 declaration under Section 564(b)(1) of the  Act, 21 U.S.C.section 360bbb-3(b)(1), unless the authorization is terminated  or revoked sooner.       Influenza A by PCR NEGATIVE NEGATIVE Final   Influenza B by PCR NEGATIVE NEGATIVE Final    Comment: (NOTE) The Xpert Xpress SARS-CoV-2/FLU/RSV plus assay is intended as an aid in the diagnosis of influenza from Nasopharyngeal swab specimens and should not be used as a sole basis for treatment. Nasal washings and aspirates are unacceptable for Xpert Xpress SARS-CoV-2/FLU/RSV testing.  Fact Sheet for Patients: EntrepreneurPulse.com.au  Fact Sheet for Healthcare Providers: IncredibleEmployment.be  This test is not yet approved or cleared by the [place] FDA and has been authorized for detection and/or diagnosis of SARS-CoV-2 by FDA under an Emergency Use Authorization (EUA). This EUA will remain in effect (meaning this test can be used) for the duration of the COVID-19 declaration under Section 564(b)(1) of the Act, 21 U.S.C. section 360bbb-3(b)(1), unless the authorization is terminated or revoked.  Performed at [facility], [address] [place], [place] [address]      Signed: Terrilee Croak  [facility] 02/04/2021, 2:38 PM

## 2021-02-04 NOTE — Progress Notes (Signed)
Carotid duplex has been completed.  Results can be found under chart review under CV PROC. 02/04/2021 1:43 PM Kyrah Schiro RVT, RDMS

## 2021-02-04 NOTE — Consult Note (Addendum)
CARDIOLOGY CONSULT NOTE  Patient ID: Wesley Bright MRN: [id number] DOB/AGE: 69-Sep-1952 69 y.o.  Admit date: 02/03/2021 Referring Physician: [facility] hospitalist Reason for Consultation:  Syncope HPI:   70 y.o. Caucasian male  with hypertension, hyperlipidemia, CLL s/p chemotherapy in remission, admitted with syncope, chest pain.    Patient is originally from [place], works at the [employer].his work duties include setting up tables etc.  For last 1 week, patient has been experiencing severe "air hunger" and dyspnea with minimal physical activity.  To me, he denies any specific chest pain.  Few days ago, he had a syncopal episode followed by dyspnea on exertion, bilateral.  He lost consciousness for about 1 minute.  He recovered from that and has not had any recurrent syncope since then.  He was seeing his primary care doctor yesterday and was sent to ER with concerns regarding acuity of his symptoms.    In the ER, patient was mildly hypertensive, heart rate 54 bpm, HS troponin 3, 4.  D-dimer is negative.  Echocardiogram pending.    Past Medical History:  Diagnosis Date   External hemorrhoid    Hypertension    Leukemia ([place]) 2018     Past Surgical History:  Procedure Laterality Date   APPENDECTOMY     INGUINAL HERNIA REPAIR     left   KNEE SURGERY     Left Knee      Family History  Problem Relation Age of Onset   Liver cancer Brother    Cancer Brother        Liver   Esophageal cancer Father    Cancer Father        Throat    Cancer Mother        Bladder - cause of death   Pancreatic cancer Maternal Uncle    Pancreatic cancer Paternal Uncle    Stomach cancer Maternal Uncle    Colon cancer Neg Hx    Inflammatory bowel disease Neg Hx    Rectal cancer Neg Hx      Social History: Social History   Socioeconomic History   Marital status: Married    Spouse name: Not on file   Number of children: 2   Years of education: Not on file   Highest education level:  Not on file  Occupational History   Occupation: Buyer, retail: [employer]  Tobacco Use   Smoking status: Every Day    Packs/day: 0.50    Years: 50.00    Pack years: 25.00    Types: Cigarettes   Smokeless tobacco: Never  Vaping Use   Vaping Use: Never used  Substance and Sexual Activity   Alcohol use: Yes    Comment: occasional   Drug use: No   Sexual activity: Not on file  Other Topics Concern   Not on file  Social History Narrative   Not on file   Social Determinants of Health   Financial Resource Strain: Not on file  Food Insecurity: Not on file  Transportation Needs: Not on file  Physical Activity: Not on file  Stress: Not on file  Social Connections: Not on file  Intimate Partner Violence: Not on file     Medications Prior to Admission  Medication Sig Dispense Refill Last Dose   cholecalciferol (VITAMIN D) 1000 units tablet Take 1,000 Units by mouth daily.      co-enzyme Q-10 30 MG capsule Take 30 mg by mouth 3 (three) times daily.  omeprazole (PRILOSEC) 40 MG capsule Take 1 capsule (40 mg total) by mouth in the morning and at bedtime. 60 capsule 3    tamsulosin (FLOMAX) 0.4 MG CAPS capsule Take 0.4 mg by mouth at bedtime.   Past Week   vitamin B-12 (CYANOCOBALAMIN) 100 MCG tablet Take 100 mcg by mouth daily.      amLODipine (NORVASC) 5 MG tablet Take 5 mg by mouth daily. (Patient not taking: Reported on 02/03/2021)   Not Taking   rosuvastatin (CRESTOR) 10 MG tablet Take 1 tablet (10 mg total) by mouth daily. (Patient not taking: Reported on 02/03/2021) 90 tablet 3 Not Taking    Review of Systems  Constitutional: Negative for decreased appetite, malaise/fatigue, weight gain and weight loss.  HENT:  Negative for congestion.   Eyes:  Negative for visual disturbance.  Cardiovascular:  Positive for dyspnea on exertion and syncope. Negative for chest pain, leg swelling and palpitations.  Respiratory:  Negative for cough.   Endocrine: Negative  for cold intolerance.  Hematologic/Lymphatic: Does not bruise/bleed easily.  Skin:  Negative for itching and rash.  Musculoskeletal:  Negative for myalgias.  Gastrointestinal:  Negative for abdominal pain, nausea and vomiting.  Genitourinary:  Negative for dysuria.  Neurological:  Negative for dizziness and weakness.  Psychiatric/Behavioral:  The patient is not nervous/anxious.   All other systems reviewed and are negative.    Physical Exam: Physical Exam Vitals and nursing note reviewed.  Constitutional:      General: He is not in acute distress.    Appearance: He is well-developed.  HENT:     Head: Normocephalic and atraumatic.  Eyes:     Conjunctiva/sclera: Conjunctivae normal.     Pupils: Pupils are equal, round, and reactive to light.  Neck:     Vascular: No JVD.  Cardiovascular:     Rate and Rhythm: Normal rate and regular rhythm.     Pulses: Normal pulses and intact distal pulses.     Heart sounds: No murmur heard. Pulmonary:     Effort: Pulmonary effort is normal.     Breath sounds: Normal breath sounds. No wheezing or rales.  Abdominal:     General: Bowel sounds are normal.     Palpations: Abdomen is soft.     Tenderness: There is no rebound.  Musculoskeletal:        General: No tenderness. Normal range of motion.     Right lower leg: No edema.     Left lower leg: No edema.  Lymphadenopathy:     Cervical: No cervical adenopathy.  Skin:    General: Skin is warm and dry.  Neurological:     Mental Status: He is alert and oriented to person, place, and time.     Cranial Nerves: No cranial nerve deficit.     Labs:   Lab Results  Component Value Date   WBC 5.3 02/04/2021   HGB 15.1 02/04/2021   HCT 45.7 02/04/2021   MCV 90.5 02/04/2021   PLT 117 (L) 02/04/2021    Recent Labs  Lab 02/03/21 1335 02/04/21 0339  NA 138 139  K 4.3 3.8  CL 105 109  CO2 25 23  BUN 17 16  CREATININE 1.58* 1.20  CALCIUM 8.9 8.4*  PROT 6.6  --   BILITOT 0.5  --    ALKPHOS 99  --   ALT 15  --   AST 13*  --   GLUCOSE 97 92    Lipid Panel     Component Value  Date/Time   CHOL 208 (H) 07/01/2020 1029   TRIG 131 07/01/2020 1029   HDL 43 07/01/2020 1029   CHOLHDL 4.8 07/01/2020 1029   LDLCALC 141 (H) 07/01/2020 1029    BNP (last 3 results) Recent Labs    09/06/20 1906  BNP 27.9    HEMOGLOBIN A1C Lab Results  Component Value Date   HGBA1C 5.4 12/07/2016    Cardiac Panel (last 3 results) No results for input(s): CKTOTAL, CKMB, RELINDX in the last 8760 hours.  Invalid input(s): TROPONINHS  No results found for: CKTOTAL, CKMB, CKMBINDEX   TSH Recent Labs    04/04/20 1449 07/01/20 1029  TSH 0.40 0.514      Radiology: DG Chest Portable 1 View  Result Date: 02/03/2021 CLINICAL DATA:  Shortness of breath. Additional history provided: Fainting episode, irregular EKG at PCPs office. EXAM: PORTABLE CHEST 1 VIEW COMPARISON:  Radiographs 09/06/2020 and earlier. FINDINGS: A small portion of the left lateral costophrenic angle is excluded from the field of view. Heart size within normal limits. No appreciable airspace consolidation or pulmonary edema. No evidence of pleural effusion or pneumothorax. No acute bony abnormality identified. IMPRESSION: A small portion of the left lateral costophrenic angle is excluded from the field of view. No evidence of active cardiopulmonary disease. Electronically Signed   By: Kellie Simmering D.O.   On: 02/03/2021 14:18    Scheduled Meds:  enoxaparin (LOVENOX) injection  40 mg Subcutaneous Q24H   pantoprazole  40 mg Oral Daily   Continuous Infusions:  sodium chloride 100 mL/hr at 02/04/21 0009   PRN Meds:.acetaminophen **OR** acetaminophen, magnesium hydroxide, ondansetron **OR** ondansetron (ZOFRAN) IV, traZODone  CARDIAC STUDIES:  EKG 02/03/2021:  Sinus rhythm w/PACs Otherwise normal EKG.  Echocardiogram: Pending   Assessment & Recommendations:  70 y.o. Caucasian male  with hypertension,  hyperlipidemia, CLL s/p chemotherapy in remission, admitted with syncope, dyspnea on exertion  Syncope: Exertional syncope with rather acute symptoms of dyspnea on exertion are concerning.  He has been excluded for ACS and PE with normal troponins and D-dimer.  Echocardiogram is pending.  My differential includes pulmonary hypertension, small possibility of angina equivalent.  If echocardiogram shows pulmonary hypertension or RV enlargement, I will still consider CT angiogram to rule out PE, even though D-dimer is negative.  I will arrange outpatient cardiac telemetry to evaluate for any arrhythmia.  However, arrhythmia would still not explain his dyspnea on exertion symptoms.  Further recommendations after above testing.    Nigel Mormon, MD Pager: [phone] Office: [phone]

## 2021-02-04 NOTE — Progress Notes (Signed)
Echocardiogram is unremarkable. No PH. Normal RV size and function. CAD remains a possibility. Will arrange outpatient CTA. HR is <50 so will not need beta blocker for CTA. Cr has improved.   Nigel Mormon, MD Pager: [phone] Office: [phone]

## 2021-02-05 LAB — URINE CULTURE: Culture: NO GROWTH

## 2021-02-06 ENCOUNTER — Telehealth (HOSPITAL_COMMUNITY): Payer: Self-pay | Admitting: Emergency Medicine

## 2021-02-06 NOTE — Telephone Encounter (Signed)
Attempted to call patient regarding upcoming cardiac CT appointment. °Left message on voicemail with name and callback number °Damonique Brunelle RN Navigator Cardiac Imaging °[facility] °[phone] Office °[phone] Cell ° °

## 2021-02-06 NOTE — Telephone Encounter (Signed)
Reaching out to patient to offer assistance regarding upcoming cardiac imaging study; pt verbalizes understanding of appt date/time, parking situation and where to check in, pre-test NPO status and medications ordered, and verified current allergies; name and call back number provided for further questions should they arise Marchia Bond RN Navigator Cardiac Imaging [facility] [phone] office [phone] cell  Daily meds

## 2021-02-07 ENCOUNTER — Ambulatory Visit (HOSPITAL_COMMUNITY)
Admission: RE | Admit: 2021-02-07 | Discharge: 2021-02-07 | Disposition: A | Discharge status: Home / Self Care | Payer: PPO | Source: Ambulatory Visit | Attending: Cardiology | Admitting: Cardiology

## 2021-02-07 ENCOUNTER — Other Ambulatory Visit: Payer: Self-pay

## 2021-02-07 DIAGNOSIS — I700 Atherosclerosis of aorta: Secondary | ICD-10-CM | POA: Diagnosis not present

## 2021-02-07 DIAGNOSIS — R0600 Dyspnea, unspecified: Secondary | ICD-10-CM | POA: Diagnosis not present

## 2021-02-07 DIAGNOSIS — R0609 Other forms of dyspnea: Secondary | ICD-10-CM

## 2021-02-07 DIAGNOSIS — I2510 Atherosclerotic heart disease of native coronary artery without angina pectoris: Secondary | ICD-10-CM | POA: Insufficient documentation

## 2021-02-07 DIAGNOSIS — I517 Cardiomegaly: Secondary | ICD-10-CM | POA: Insufficient documentation

## 2021-02-07 MED ORDER — IOHEXOL 350 MG/ML SOLN
100.0000 mL | Freq: Once | INTRAVENOUS | Status: AC | PRN
  Administered 2021-02-07: 100 mL via INTRAVENOUS

## 2021-02-07 MED ORDER — NITROGLYCERIN 0.4 MG SL SUBL
SUBLINGUAL_TABLET | SUBLINGUAL | Status: AC
  Filled 2021-02-07: qty 2

## 2021-02-07 MED ORDER — NITROGLYCERIN 0.4 MG SL SUBL
0.8000 mg | SUBLINGUAL_TABLET | Freq: Once | SUBLINGUAL | Status: AC
  Administered 2021-02-07: 0.8 mg via SUBLINGUAL

## 2021-02-09 DIAGNOSIS — R0600 Dyspnea, unspecified: Secondary | ICD-10-CM | POA: Insufficient documentation

## 2021-02-09 DIAGNOSIS — R0609 Other forms of dyspnea: Secondary | ICD-10-CM | POA: Insufficient documentation

## 2021-02-14 ENCOUNTER — Encounter: Payer: Self-pay | Admitting: Cardiology

## 2021-02-14 ENCOUNTER — Ambulatory Visit: Payer: PPO | Admitting: Cardiology

## 2021-02-14 ENCOUNTER — Other Ambulatory Visit: Payer: Self-pay

## 2021-02-14 VITALS — BP 134/90 | HR 68 | Temp 97.8°F | Resp 16 | Ht 72.0 in | Wt 186.0 lb

## 2021-02-14 DIAGNOSIS — E782 Mixed hyperlipidemia: Secondary | ICD-10-CM | POA: Diagnosis not present

## 2021-02-14 DIAGNOSIS — R55 Syncope and collapse: Secondary | ICD-10-CM

## 2021-02-14 DIAGNOSIS — R0609 Other forms of dyspnea: Secondary | ICD-10-CM

## 2021-02-14 MED ORDER — ATORVASTATIN CALCIUM 40 MG PO TABS
40.0000 mg | ORAL_TABLET | Freq: Every day | ORAL | 3 refills | Status: DC
Start: 2021-02-14 — End: 2021-06-11

## 2021-02-14 NOTE — Progress Notes (Signed)
Patient referred by [name], [place], * for syncope  Subjective:   Wesley Bright, male    DOB: 07-01-1950, 70 y.o.   MRN: [id number]   Chief Complaint  Patient presents with   New Patient (Initial Visit)   Hospitalization Follow-up   Loss of Consciousness     HPI  70 y.o. Caucasian male  with hypertension, hyperlipidemia, CLL s/p chemotherapy in remission, former smoker, with exertional dyspnea, syncope   Patient is originally from [place], works at the [employer].his work duties include setting up tables etc.  For last 2 weeks, patient has been experiencing severe "air hunger" and dyspnea with minimal physical activity.  To me, he denies any specific chest pain.  Few days ago, he had a syncopal episode followed by dyspnea on exertion, bilateral.  He lost consciousness for about 1 minute.  He recovered from that and has not had any recurrent syncope since then.  He was send to [facility] ER by his PCP.  Work-up showed negative high-sensitivity troponin x2, negative D-dimer, essentially normal echocardiogram.  Given suspicion for septic coronary artery disease, recommend coronary CT angiogram, details below.  Briefly, calcium score was 0, but he did have mild nonobstructive CAD.  More broadly, he did not have any left main or proximal significant stenosis.   Current Outpatient Medications on File Prior to Visit  Medication Sig Dispense Refill   amLODipine (NORVASC) 5 MG tablet Take 5 mg by mouth daily. (Patient not taking: Reported on 02/03/2021)     cholecalciferol (VITAMIN D) 1000 units tablet Take 1,000 Units by mouth daily.     co-enzyme Q-10 30 MG capsule Take 30 mg by mouth 3 (three) times daily.     omeprazole (PRILOSEC) 40 MG capsule Take 1 capsule (40 mg total) by mouth in the morning and at bedtime. 60 capsule 3   rosuvastatin (CRESTOR) 10 MG tablet Take 1 tablet (10 mg total) by mouth daily. (Patient not taking: Reported on 02/03/2021) 90 tablet 3   tamsulosin  (FLOMAX) 0.4 MG CAPS capsule Take 0.4 mg by mouth at bedtime.     vitamin B-12 (CYANOCOBALAMIN) 100 MCG tablet Take 100 mcg by mouth daily.     No current facility-administered medications on file prior to visit.    Cardiovascular and other pertinent studies:  EKG 02/14/2021: Sinus rhythm 74 bpm Frequent PACs    EKG 02/03/2021:  Sinus rhythm w/PACs Otherwise normal EKG.  CTA 02/07/2021: 1. Total coronary calcium score of 0. 2. Normal coronary origin with right dominance. 3. Mild dilatation of the mid ascending aorta at 39.4 mm. 4. Aortic atherosclerosis. 5. CAD-RADS = 2 Mild non-obstructive CAD (25-49%).   LM: Patent LAD: Patent LCx: Patent RCA: Proximal to mid RCA is patent. Mild stenosis (25-49%) due to noncalcified plaque at mid to distal RCA. Esophageal air fluid level suggests dysmotility or gastroesophageal reflux.    Echocardiogram 02/04/2021:  1. Left ventricular ejection fraction, by estimation, is 65 to 70%. The  left ventricle has normal function. The left ventricle has no regional  wall motion abnormalities. Left ventricular diastolic function could not  be evaluated.   2. Right ventricular systolic function is normal. The right ventricular  size is normal. Tricuspid regurgitation signal is inadequate for assessing  PA pressure.   3. The mitral valve is normal in structure. Trivial mitral valve  regurgitation. No evidence of mitral stenosis.   4. The aortic valve is normal in structure. Aortic valve regurgitation is  not visualized. No  aortic stenosis is present.   5. Aortic dilatation noted. There is mild dilatation of the ascending  aorta, measuring 38 mm.   6. The inferior vena cava is normal in size with greater than 50%  respiratory variability, suggesting right atrial pressure of 3 mmHg.    Recent labs: 02/04/2021: Glucose 92, BUN/Cr 16/1.20. EGFR >60. Na/K 139/3.8. Rest of the CMP normal H/H 15/45. MCV 90. Platelets 117 HbA1C N/A Trop HS  5-->4-->3  06/2020: Chol 208, TG 131, HDL 43, LDL 141 TSH 0.5 normal      Review of Systems  Cardiovascular:  Positive for dyspnea on exertion. Negative for chest pain, leg swelling, palpitations and syncope.        Vitals:   02/14/21 1357 02/14/21 1358  BP:    Pulse:    Resp:    Temp:    SpO2: 97% 96%   Orthostatic VS for the past 72 hrs (Last 3 readings):  Orthostatic BP Patient Position BP Location Cuff Size Orthostatic Pulse  02/14/21 1358 130/84 Standing Left Arm Normal 92  02/14/21 1357 (!) 124/92 Sitting Left Arm Normal 70  02/14/21 1356 144/83 Supine Left Arm Normal 70      Body mass index is 25.23 kg/m. Filed Weights   02/14/21 1348  Weight: 186 lb (84.4 kg)     Objective:   Physical Exam Vitals and nursing note reviewed.  Constitutional:      General: He is not in acute distress. Neck:     Vascular: No JVD.  Cardiovascular:     Rate and Rhythm: Normal rate and regular rhythm.     Heart sounds: Normal heart sounds. No murmur heard. Pulmonary:     Effort: Pulmonary effort is normal.     Breath sounds: Normal breath sounds. No wheezing or rales.  Musculoskeletal:     Right lower leg: No edema.     Left lower leg: No edema.        Assessment & Recommendations:   70 y.o. Caucasian male  with hypertension, hyperlipidemia, CLL s/p chemotherapy in remission, former smoker, with exertional dyspnea, syncope  Exertional dyspnea: While he has mild CAD, there is no etiology to explain his exertional dyspnea symptoms. Consider pulmonary evaluation such as PFT given his smoking history I do suspect his brief syncope was related to his preceding severe dyspnea  Mild nonobstructive CAD, mixed hyperlipidemia: Recommend heart healthy diet and lifestyle Added Lipitor 40 mg. Repeat lipid panel in 6 months  Thank you for referring the patient to [place]. Please feel free to contact with any questions.   Nigel Mormon, MD Pager: [phone] Office:  [phone]

## 2021-02-19 DIAGNOSIS — R140 Abdominal distension (gaseous): Secondary | ICD-10-CM | POA: Diagnosis not present

## 2021-02-19 DIAGNOSIS — R55 Syncope and collapse: Secondary | ICD-10-CM | POA: Diagnosis not present

## 2021-02-19 DIAGNOSIS — F419 Anxiety disorder, unspecified: Secondary | ICD-10-CM | POA: Diagnosis not present

## 2021-02-19 DIAGNOSIS — I493 Ventricular premature depolarization: Secondary | ICD-10-CM | POA: Diagnosis not present

## 2021-02-19 DIAGNOSIS — K219 Gastro-esophageal reflux disease without esophagitis: Secondary | ICD-10-CM | POA: Diagnosis not present

## 2021-02-19 DIAGNOSIS — R42 Dizziness and giddiness: Secondary | ICD-10-CM | POA: Diagnosis not present

## 2021-02-24 DIAGNOSIS — R161 Splenomegaly, not elsewhere classified: Secondary | ICD-10-CM | POA: Diagnosis not present

## 2021-02-24 DIAGNOSIS — R140 Abdominal distension (gaseous): Secondary | ICD-10-CM | POA: Diagnosis not present

## 2021-03-12 DIAGNOSIS — R55 Syncope and collapse: Secondary | ICD-10-CM | POA: Diagnosis not present

## 2021-03-12 DIAGNOSIS — R42 Dizziness and giddiness: Secondary | ICD-10-CM | POA: Diagnosis not present

## 2021-03-24 DIAGNOSIS — R4020 Unspecified coma: Secondary | ICD-10-CM | POA: Diagnosis not present

## 2021-04-07 DIAGNOSIS — R55 Syncope and collapse: Secondary | ICD-10-CM | POA: Diagnosis not present

## 2021-04-07 DIAGNOSIS — R4020 Unspecified coma: Secondary | ICD-10-CM | POA: Diagnosis not present

## 2021-04-07 DIAGNOSIS — G319 Degenerative disease of nervous system, unspecified: Secondary | ICD-10-CM | POA: Diagnosis not present

## 2021-04-07 DIAGNOSIS — J3489 Other specified disorders of nose and nasal sinuses: Secondary | ICD-10-CM | POA: Diagnosis not present

## 2021-04-07 DIAGNOSIS — R42 Dizziness and giddiness: Secondary | ICD-10-CM | POA: Diagnosis not present

## 2021-04-11 DIAGNOSIS — Z03818 Encounter for observation for suspected exposure to other biological agents ruled out: Secondary | ICD-10-CM | POA: Diagnosis not present

## 2021-04-11 DIAGNOSIS — Z20822 Contact with and (suspected) exposure to covid-19: Secondary | ICD-10-CM | POA: Diagnosis not present

## 2021-04-14 DIAGNOSIS — J201 Acute bronchitis due to Hemophilus influenzae: Secondary | ICD-10-CM | POA: Diagnosis not present

## 2021-04-14 DIAGNOSIS — J101 Influenza due to other identified influenza virus with other respiratory manifestations: Secondary | ICD-10-CM | POA: Diagnosis not present

## 2021-04-14 DIAGNOSIS — N1831 Chronic kidney disease, stage 3a: Secondary | ICD-10-CM | POA: Diagnosis not present

## 2021-04-21 DIAGNOSIS — F419 Anxiety disorder, unspecified: Secondary | ICD-10-CM | POA: Diagnosis not present

## 2021-04-22 DIAGNOSIS — R55 Syncope and collapse: Secondary | ICD-10-CM | POA: Diagnosis not present

## 2021-04-29 ENCOUNTER — Other Ambulatory Visit: Payer: Self-pay

## 2021-04-29 DIAGNOSIS — C9110 Chronic lymphocytic leukemia of B-cell type not having achieved remission: Secondary | ICD-10-CM

## 2021-04-30 ENCOUNTER — Inpatient Hospital Stay: Payer: PPO

## 2021-04-30 ENCOUNTER — Other Ambulatory Visit: Payer: Self-pay

## 2021-04-30 ENCOUNTER — Inpatient Hospital Stay: Payer: PPO | Attending: Hematology | Admitting: Hematology

## 2021-04-30 VITALS — BP 140/83 | HR 74 | Temp 98.2°F | Resp 16 | Ht 72.0 in | Wt 182.1 lb

## 2021-04-30 DIAGNOSIS — C9110 Chronic lymphocytic leukemia of B-cell type not having achieved remission: Secondary | ICD-10-CM | POA: Diagnosis not present

## 2021-04-30 LAB — CBC WITH DIFFERENTIAL (CANCER CENTER ONLY)
Abs Immature Granulocytes: 0.02 10*3/uL (ref 0.00–0.07)
Basophils Absolute: 0.1 10*3/uL (ref 0.0–0.1)
Basophils Relative: 1 %
Eosinophils Absolute: 0.1 10*3/uL (ref 0.0–0.5)
Eosinophils Relative: 2 %
HCT: 45.8 % (ref 39.0–52.0)
Hemoglobin: 15.5 g/dL (ref 13.0–17.0)
Immature Granulocytes: 0 %
Lymphocytes Relative: 23 %
Lymphs Abs: 1.5 10*3/uL (ref 0.7–4.0)
MCH: 30.2 pg (ref 26.0–34.0)
MCHC: 33.8 g/dL (ref 30.0–36.0)
MCV: 89.3 fL (ref 80.0–100.0)
Monocytes Absolute: 0.7 10*3/uL (ref 0.1–1.0)
Monocytes Relative: 11 %
Neutro Abs: 4.2 10*3/uL (ref 1.7–7.7)
Neutrophils Relative %: 63 %
Platelet Count: 230 10*3/uL (ref 150–400)
RBC: 5.13 MIL/uL (ref 4.22–5.81)
RDW: 13.8 % (ref 11.5–15.5)
WBC Count: 6.6 10*3/uL (ref 4.0–10.5)
nRBC: 0 % (ref 0.0–0.2)

## 2021-04-30 LAB — CMP (CANCER CENTER ONLY)
ALT: 14 U/L (ref 0–44)
AST: 16 U/L (ref 15–41)
Albumin: 3.3 g/dL — ABNORMAL LOW (ref 3.5–5.0)
Alkaline Phosphatase: 108 U/L (ref 38–126)
Anion gap: 7 (ref 5–15)
BUN: 18 mg/dL (ref 8–23)
CO2: 25 mmol/L (ref 22–32)
Calcium: 8.5 mg/dL — ABNORMAL LOW (ref 8.9–10.3)
Chloride: 110 mmol/L (ref 98–111)
Creatinine: 1.7 mg/dL — ABNORMAL HIGH (ref 0.61–1.24)
GFR, Estimated: 43 mL/min — ABNORMAL LOW (ref >60)
Glucose, Bld: 78 mg/dL (ref 70–99)
Potassium: 4.6 mmol/L (ref 3.5–5.1)
Sodium: 142 mmol/L (ref 135–145)
Total Bilirubin: 0.4 mg/dL (ref 0.3–1.2)
Total Protein: 6.4 g/dL — ABNORMAL LOW (ref 6.5–8.1)

## 2021-04-30 LAB — LACTATE DEHYDROGENASE: LDH: 117 U/L (ref 98–192)

## 2021-05-01 ENCOUNTER — Telehealth: Payer: Self-pay | Admitting: Hematology

## 2021-05-01 NOTE — Telephone Encounter (Signed)
Scheduled follow-up appointment per 12/14 los. Patient's wife is aware.

## 2021-05-07 ENCOUNTER — Encounter: Payer: Self-pay | Admitting: Gastroenterology

## 2021-05-18 ENCOUNTER — Encounter: Payer: Self-pay | Admitting: Hematology

## 2021-05-18 NOTE — Progress Notes (Signed)
HEMATOLOGY/ONCOLOGY CLINIC NOTE  Date of Service: .04/30/2021   Patient Care Team: Wesley Bright, [place] as PCP - General (Family Medicine)  CHIEF COMPLAINTS/PURPOSE OF CONSULTATION:  Follow-up for continued management of chronic lymphocytic leukemia.  Oncologic History:  [name] with CLL after a CLL FISH study revealed heterozygous 13q deletion within 92% cells. Subsequent imaging showed diffuse non-bulky lymphadenopathy in chest, abdomen, and pelvis with splenomegaly of 1200 cm^3. The pt began daily Ibrutinib 420mg  on 01/27/17 after becoming increasing fatigued and lost 15 pounds of weight over 4-5 months.  He has now been off ibrutinib for more than 6 months.  He was treated with Rituxan weekly x4 to try to get a deeper response and achieved CR prior to discontinuation of his ibrutinib.  HISTORY OF PRESENTING ILLNESS:  Please see previous note for details on initial presentation  Interval History:   Wesley Bright is here for follow-up of his CLL after his last clinic visit on 01/06/2021.  He is accompanied by his wife for this visit.  He notes that he was having some brief syncopal symptomatology while at work which could have been from dehydration.  He however stopped his antihypertensives and she will be following up with his primary care physician to discuss this.  He notes he has been feeling better after being off all his medications.  We discussed that he should discuss these with his primary care physician since he might need the Flomax for his BPH to avoid obstructive uropathy.  Patient notes he did have influenza A about a month ago and this has resolved.  Patient notes no fevers no chills no night sweats no unexpected weight loss.  Labs done today 04/30/2021 showed normal CBC, normal LDH of 117 CMP shows chronic kidney disease with a creatinine of 1.7 which is a little higher than his last creatinine which has fluctuated between  1.2-1.6. Patient notes no new lumps or bumps.. No new abdominal pain or distention.  MEDICAL HISTORY:  Past Medical History:  Diagnosis Date   External hemorrhoid    Hypertension    Leukemia ([place]) 2018    SURGICAL HISTORY: Past Surgical History:  Procedure Laterality Date   APPENDECTOMY     INGUINAL HERNIA REPAIR     left   KNEE SURGERY     Left Knee    SOCIAL HISTORY: Social History   Socioeconomic History   Marital status: Married    Spouse name: Not on file   Number of children: 2   Years of education: Not on file   Highest education level: Not on file  Occupational History   Occupation: Buyer, retail: [employer]  Tobacco Use   Smoking status: Every Day    Packs/day: 0.50    Years: 50.00    Pack years: 25.00    Types: Cigarettes   Smokeless tobacco: Never  Vaping Use   Vaping Use: Never used  Substance and Sexual Activity   Alcohol use: Yes    Comment: occasional   Drug use: No   Sexual activity: Not on file  Other Topics Concern   Not on file  Social History Narrative   Not on file   Social Determinants of Health   Financial Resource Strain: Not on file  Food Insecurity: Not on file  Transportation Needs: Not on file  Physical Activity: Not on file  Stress: Not on file  Social Connections: Not on file  Intimate Partner Violence: Not  on file    FAMILY HISTORY: Family History  Problem Relation Age of Onset   Cancer Mother        Bladder - cause of death   Esophageal cancer Father    Cancer Father        Throat    Liver cancer Brother    Cancer Brother        Liver   Pancreatic cancer Maternal Uncle    Stomach cancer Maternal Uncle    Pancreatic cancer Paternal Uncle    Colon cancer Neg Hx    Inflammatory bowel disease Neg Hx    Rectal cancer Neg Hx     ALLERGIES:  has No Known Allergies.  MEDICATIONS:  Current Outpatient Medications  Medication Sig Dispense Refill   amLODipine (NORVASC) 5 MG tablet Take 5 mg  by mouth daily. (Patient not taking: Reported on 02/14/2021)     atorvastatin (LIPITOR) 40 MG tablet Take 1 tablet (40 mg total) by mouth daily. 90 tablet 3   co-enzyme Q-10 30 MG capsule Take 30 mg by mouth 3 (three) times daily.     omeprazole (PRILOSEC) 40 MG capsule Take 1 capsule (40 mg total) by mouth in the morning and at bedtime. 60 capsule 3   tamsulosin (FLOMAX) 0.4 MG CAPS capsule Take 0.4 mg by mouth at bedtime.     No current facility-administered medications for this visit.    REVIEW OF SYSTEMS:   .10 Point review of Systems was done is negative except as noted above.  PHYSICAL EXAMINATION:  Vitals:   04/30/21 1505  BP: 140/83  Pulse: 74  Resp: 16  Temp: 98.2 F (36.8 C)  SpO2: 99%   Filed Weights   04/30/21 1505  Weight: 182 lb 1.6 oz (82.6 kg)   .Body mass index is 24.7 kg/m.  Marland Kitchen GENERAL:alert, in no acute distress and comfortable SKIN: no acute rashes, no significant lesions EYES: conjunctiva are pink and non-injected, sclera anicteric OROPHARYNX: MMM, no exudates, no oropharyngeal erythema or ulceration NECK: supple, no JVD LYMPH:  no palpable lymphadenopathy in the cervical, axillary or inguinal regions LUNGS: clear to auscultation b/l with normal respiratory effort HEART: regular rate & rhythm ABDOMEN:  normoactive bowel sounds , non tender, not distended. Extremity: no pedal edema PSYCH: alert & oriented x 3 with fluent speech NEURO: no focal motor/sensory deficits    LABORATORY DATA:  I have reviewed the data as listed  . CBC Latest Ref Rng & Units 04/30/2021 02/04/2021 02/03/2021  WBC 4.0 - 10.5 K/uL 6.6 5.3 6.5  Hemoglobin 13.0 - 17.0 g/dL 15.5 15.1 16.5  Hematocrit 39.0 - 52.0 % 45.8 45.7 49.1  Platelets 150 - 400 K/uL 230 117(L) 143(L)    . CMP Latest Ref Rng & Units 04/30/2021 02/04/2021 02/03/2021  Glucose 70 - 99 mg/dL 78 92 97  BUN 8 - 23 mg/dL 18 16 17   Creatinine 0.61 - 1.24 mg/dL 1.70(H) 1.20 1.58(H)  Sodium 135 - 145 mmol/L 142  139 138  Potassium 3.5 - 5.1 mmol/L 4.6 3.8 4.3  Chloride 98 - 111 mmol/L 110 109 105  CO2 22 - 32 mmol/L 25 23 25   Calcium 8.9 - 10.3 mg/dL 8.5(L) 8.4(L) 8.9  Total Protein 6.5 - 8.1 g/dL 6.4(L) - 6.6  Total Bilirubin 0.3 - 1.2 mg/dL 0.4 - 0.5  Alkaline Phos 38 - 126 U/L 108 - 99  AST 15 - 41 U/L 16 - 13(L)  ALT 0 - 44 U/L 14 - 15   .  Lab Results  Component Value Date   LDH 117 04/30/2021    06/20/2019 Kidney Biopsy:    12/31/16 Flow Cytometry:    12/15/16 CLL FISH:    RADIOGRAPHIC STUDIES: I have personally reviewed the radiological images as listed and agreed with the findings in the report. No results found.  ASSESSMENT & PLAN:  71 y.o. male with  1. Chronic Lymphocytic Leukemia - monoalleilic 31D deletion. Was started on Ibrutinib from 01/2017 due to fatigue, weight loss and significant splenomegaly. S/p Rituxan x 4 in 2021. Currently on active surveillance and off ibrutinib after receiving Rituxan.  2. H/o Grade 1 intermittent rash ?related to Ibrutinib - no currently an issue.  Off ibrutinib  PLAN:   -Patients labs were reviewed with him today.  CBC remains within normal limits.  LDH within normal limits. -Patient has no clinical symptoms suggestive of CLL progression at this time. -No lab findings suggestive of CLL progression at this time. -No indication for additional treatment of the patient's CLL at this time.  3.  Chronic kidney disease likely related to hypertensive arteriolosclerosis. And possible obstructive uropathy. Plan -Patient had a presyncopal episode at work and stopped taking all his medications.  He notes he is feeling well without taking his medications and so he did not restart these. -Patient was recommended to follow-up with his primary care physician for management of his antihypertensives and other medications. -He was recommended to maintain fluid intake with water intake of at least 2 L a day.  3.  Patient Active Problem List    Diagnosis Date Noted   Mixed hyperlipidemia 02/14/2021   Exertional dyspnea    Syncope and collapse 02/03/2021   Hx of adenomatous colonic polyps 04/09/2020   Abdominal distension 04/09/2020   Bloating 04/09/2020   History of duodenitis 04/09/2020   Counseling regarding advance care planning and goals of care 01/08/2020   Skin rash 10/03/2017   CLL (chronic lymphocytic leukemia) ([place]) 12/09/2016   General weakness 12/07/2016   Smoker 12/07/2016   Loss of weight 12/07/2016   GERD (gastroesophageal reflux disease) 12/30/2010   Chronic constipation 12/30/2010   Hemorrhoids, external 12/30/2010    FOLLOW UP: F/u with PCP in 3 months RTC with Dr Irene Limbo with labs in 6 months  All of the patient's questions were answered with apparent satisfaction. The patient knows to call the clinic with any problems, questions or concerns.   Sullivan Lone MD [place] AAHIVMS [facility] Hematology/Oncology Physician [facility]

## 2021-06-03 DIAGNOSIS — C9110 Chronic lymphocytic leukemia of B-cell type not having achieved remission: Secondary | ICD-10-CM | POA: Diagnosis not present

## 2021-06-03 DIAGNOSIS — I129 Hypertensive chronic kidney disease with stage 1 through stage 4 chronic kidney disease, or unspecified chronic kidney disease: Secondary | ICD-10-CM | POA: Diagnosis not present

## 2021-06-03 DIAGNOSIS — N1832 Chronic kidney disease, stage 3b: Secondary | ICD-10-CM | POA: Diagnosis not present

## 2021-06-03 DIAGNOSIS — N401 Enlarged prostate with lower urinary tract symptoms: Secondary | ICD-10-CM | POA: Diagnosis not present

## 2021-06-03 DIAGNOSIS — R809 Proteinuria, unspecified: Secondary | ICD-10-CM | POA: Diagnosis not present

## 2021-06-03 DIAGNOSIS — R3129 Other microscopic hematuria: Secondary | ICD-10-CM | POA: Diagnosis not present

## 2021-06-11 ENCOUNTER — Other Ambulatory Visit: Payer: Self-pay

## 2021-06-11 ENCOUNTER — Ambulatory Visit (AMBULATORY_SURGERY_CENTER): Payer: PPO | Admitting: *Deleted

## 2021-06-11 VITALS — Ht 72.0 in | Wt 182.0 lb

## 2021-06-11 DIAGNOSIS — K209 Esophagitis, unspecified without bleeding: Secondary | ICD-10-CM

## 2021-06-11 DIAGNOSIS — K2970 Gastritis, unspecified, without bleeding: Secondary | ICD-10-CM

## 2021-06-11 DIAGNOSIS — K2990 Gastroduodenitis, unspecified, without bleeding: Secondary | ICD-10-CM

## 2021-06-11 DIAGNOSIS — K259 Gastric ulcer, unspecified as acute or chronic, without hemorrhage or perforation: Secondary | ICD-10-CM

## 2021-06-11 DIAGNOSIS — Z86010 Personal history of colonic polyps: Secondary | ICD-10-CM

## 2021-06-11 MED ORDER — NA SULFATE-K SULFATE-MG SULF 17.5-3.13-1.6 GM/177ML PO SOLN: 1.0000 | ORAL | 0 refills | Status: DC

## 2021-06-11 NOTE — Progress Notes (Signed)
Patient's pre-visit was done today over the phone with the patient and wife. Name,DOB and address verified. Patient denies any allergies to Eggs and Soy. Patient denies any problems with anesthesia/sedation. Patient is not taking any diet pills or blood thinners. No home Oxygen. Patient denies any medical chart hx changes since last GI visit.   Prep instructions sent to pt's MyChart-pt aware. Patient understands to call us back with any questions or concerns. Patient is aware of our care-partner policy and [facility]-09 safety protocol. Pt will use singlecare to help with suprep cost.   EMMI education assigned to the patient for the procedure, sent to MyChart.   The patient is COVID-19 vaccinated.

## 2021-06-17 ENCOUNTER — Encounter: Payer: Self-pay | Admitting: Hematology

## 2021-06-19 ENCOUNTER — Encounter: Payer: Self-pay | Admitting: Gastroenterology

## 2021-06-25 ENCOUNTER — Encounter: Payer: Self-pay | Admitting: Gastroenterology

## 2021-06-25 ENCOUNTER — Ambulatory Visit (AMBULATORY_SURGERY_CENTER): Payer: PPO | Admitting: Gastroenterology

## 2021-06-25 VITALS — BP 123/78 | HR 81 | Temp 97.8°F | Resp 20 | Ht 72.0 in | Wt 182.0 lb

## 2021-06-25 DIAGNOSIS — K2970 Gastritis, unspecified, without bleeding: Secondary | ICD-10-CM | POA: Diagnosis not present

## 2021-06-25 DIAGNOSIS — D128 Benign neoplasm of rectum: Secondary | ICD-10-CM | POA: Diagnosis not present

## 2021-06-25 DIAGNOSIS — D127 Benign neoplasm of rectosigmoid junction: Secondary | ICD-10-CM

## 2021-06-25 DIAGNOSIS — K449 Diaphragmatic hernia without obstruction or gangrene: Secondary | ICD-10-CM | POA: Diagnosis not present

## 2021-06-25 DIAGNOSIS — K219 Gastro-esophageal reflux disease without esophagitis: Secondary | ICD-10-CM | POA: Diagnosis not present

## 2021-06-25 DIAGNOSIS — K317 Polyp of stomach and duodenum: Secondary | ICD-10-CM | POA: Diagnosis not present

## 2021-06-25 DIAGNOSIS — I10 Essential (primary) hypertension: Secondary | ICD-10-CM | POA: Diagnosis not present

## 2021-06-25 DIAGNOSIS — Z86010 Personal history of colonic polyps: Secondary | ICD-10-CM | POA: Diagnosis not present

## 2021-06-25 MED ORDER — OMEPRAZOLE 40 MG PO CPDR: 40.0000 mg | DELAYED_RELEASE_CAPSULE | Freq: Every day | ORAL | 6 refills | Status: AC

## 2021-06-25 MED ORDER — SODIUM CHLORIDE 0.9 % IV SOLN
500.0000 mL | Freq: Once | INTRAVENOUS | Status: DC
Start: 2021-06-25 — End: 2021-06-25

## 2021-06-25 NOTE — Progress Notes (Signed)
1418 Robinul 0.1 mg IV given due large amount of secretions upon assessment.  MD made aware, vss

## 2021-06-25 NOTE — Patient Instructions (Addendum)
Handouts provided on hiatal hernia, gastritis, polyps, diverticulosis, hemorrhoids and high-fiber diet.   Restart PPI-Omeprazole 40mg  daily.   Recommend a high-fiber diet (see handout). Use FiberCon 1-2 tablets by mouth daily.    YOU HAD AN ENDOSCOPIC PROCEDURE TODAY AT [facility]:   Refer to the procedure report that was given to you for any specific questions about what was found during the examination.  If the procedure report does not answer your questions, please call your gastroenterologist to clarify.  If you requested that your care partner not be given the details of your procedure findings, then the procedure report has been included in a sealed envelope for you to review at your convenience later.  YOU SHOULD EXPECT: Some feelings of bloating in the abdomen. Passage of more gas than usual.  Walking can help get rid of the air that was put into your GI tract during the procedure and reduce the bloating. If you had a lower endoscopy (such as a colonoscopy or flexible sigmoidoscopy) you may notice spotting of blood in your stool or on the toilet paper. If you underwent a bowel prep for your procedure, you may not have a normal bowel movement for a few days.  Please Note:  You might notice some irritation and congestion in your nose or some drainage.  This is from the oxygen used during your procedure.  There is no need for concern and it should clear up in a day or so.  SYMPTOMS TO REPORT IMMEDIATELY:  Following lower endoscopy (colonoscopy or flexible sigmoidoscopy):  Excessive amounts of blood in the stool  Significant tenderness or worsening of abdominal pains  Swelling of the abdomen that is new, acute  Fever of 100F or higher  Following upper endoscopy (EGD)  Vomiting of blood or coffee ground material  New chest pain or pain under the shoulder blades  Painful or persistently difficult swallowing  New shortness of breath  Fever of 100F or higher  Black,  tarry-looking stools  For urgent or emergent issues, a gastroenterologist can be reached at any hour by calling [phone]. Do not use MyChart messaging for urgent concerns.    DIET:  We do recommend a small meal at first, but then you may proceed to your regular diet.  Drink plenty of fluids but you should avoid alcoholic beverages for 24 hours.  ACTIVITY:  You should plan to take it easy for the rest of today and you should NOT DRIVE or use heavy machinery until tomorrow (because of the sedation medicines used during the test).    FOLLOW UP: Our staff will call the number listed on your records 48-72 hours following your procedure to check on you and address any questions or concerns that you may have regarding the information given to you following your procedure. If we do not reach you, we will leave a message.  We will attempt to reach you two times.  During this call, we will ask if you have developed any symptoms of COVID 19. If you develop any symptoms (ie: fever, flu-like symptoms, shortness of breath, cough etc.) before then, please call [phone].  If you test positive for Covid 19 in the 2 weeks post procedure, please call and report this information to [place].    If any biopsies were taken you will be contacted by phone or by letter within the next 1-3 weeks.  Please call us at [phone] if you have not heard about the biopsies in 3 weeks.  SIGNATURES/CONFIDENTIALITY: You and/or your care partner have signed paperwork which will be entered into your electronic medical record.  These signatures attest to the fact that that the information above on your After Visit Summary has been reviewed and is understood.  Full responsibility of the confidentiality of this discharge information lies with you and/or your care-partner.

## 2021-06-25 NOTE — Progress Notes (Signed)
1440 Ephedrine 10 mg given IV due to low BP, MD updated.

## 2021-06-25 NOTE — Progress Notes (Signed)
GASTROENTEROLOGY PROCEDURE H&P NOTE   Primary Care Physician: [name], [place], PA-C  HPI: Wesley Bright is a 71 y.o. male who presents for EGD/Colonoscopy for follow up of prior gastric ulcer as well as polyp surveillance (19 polyps on last procedure) and only fair preparation.  Past Medical History:  Diagnosis Date   External hemorrhoid    Hypertension    Leukemia ([place]) 2018   Past Surgical History:  Procedure Laterality Date   APPENDECTOMY     COLONOSCOPY  05/30/2020   Dr.Mansouraty   INGUINAL HERNIA REPAIR     left   KNEE SURGERY     Left Knee   UPPER GASTROINTESTINAL ENDOSCOPY  05/30/2020   Dr.Mansouraty   Current Outpatient Medications  Medication Sig Dispense Refill   Ascorbic Acid (VITAMIN C PO) Take by mouth.     Cholecalciferol (VITAMIN D3 PO) Take by mouth.     Cyanocobalamin (VITAMIN B-12 PO) Take by mouth.     Na Sulfate-K Sulfate-Mg Sulf 17.5-3.13-1.6 GM/177ML SOLN Take 1 kit by mouth as directed. May use generic SUPREP; NO prior authorizations will be done. 354 mL 0   Pyridoxine HCl (VITAMIN B-6 PO) Take by mouth.     No current facility-administered medications for this visit.    Current Outpatient Medications:    Ascorbic Acid (VITAMIN C PO), Take by mouth., Disp: , Rfl:    Cholecalciferol (VITAMIN D3 PO), Take by mouth., Disp: , Rfl:    Cyanocobalamin (VITAMIN B-12 PO), Take by mouth., Disp: , Rfl:    Na Sulfate-K Sulfate-Mg Sulf 17.5-3.13-1.6 GM/177ML SOLN, Take 1 kit by mouth as directed. May use generic SUPREP; NO prior authorizations will be done., Disp: 354 mL, Rfl: 0   Pyridoxine HCl (VITAMIN B-6 PO), Take by mouth., Disp: , Rfl:  No Known Allergies Family History  Problem Relation Age of Onset   Cancer Mother        Bladder - cause of death   Esophageal cancer Father    Cancer Father        Throat    Liver cancer Brother    Cancer Brother        Liver   Pancreatic cancer Maternal Uncle    Stomach cancer Maternal Uncle     Pancreatic cancer Paternal Uncle    Colon cancer Neg Hx    Inflammatory bowel disease Neg Hx    Rectal cancer Neg Hx    Social History   Socioeconomic History   Marital status: Married    Spouse name: Not on file   Number of children: 2   Years of education: Not on file   Highest education level: Not on file  Occupational History   Occupation: Buyer, retail: [employer]  Tobacco Use   Smoking status: Every Day    Packs/day: 0.50    Years: 50.00    Pack years: 25.00    Types: Cigarettes   Smokeless tobacco: Never  Vaping Use   Vaping Use: Never used  Substance and Sexual Activity   Alcohol use: Yes    Comment: occasional   Drug use: No   Sexual activity: Not on file  Other Topics Concern   Not on file  Social History Narrative   Not on file   Social Determinants of Health   Financial Resource Strain: Not on file  Food Insecurity: Not on file  Transportation Needs: Not on file  Physical Activity: Not on file  Stress: Not on file  Social  Connections: Not on file  Intimate Partner Violence: Not on file    Physical Exam: There were no vitals filed for this visit. There is no height or weight on file to calculate BMI. GEN: NAD EYE: Sclerae anicteric ENT: MMM CV: Non-tachycardic GI: Soft, NT/ND NEURO:  Alert & Oriented x 3  Lab Results: No results for input(s): WBC, HGB, HCT, PLT in the last 72 hours. BMET No results for input(s): NA, K, CL, CO2, GLUCOSE, BUN, CREATININE, CALCIUM in the last 72 hours. LFT No results for input(s): PROT, ALBUMIN, AST, ALT, ALKPHOS, BILITOT, BILIDIR, IBILI in the last 72 hours. PT/INR No results for input(s): LABPROT, INR in the last 72 hours.   Impression / Plan: This is a 71 y.o.male who presents for EGD/Colonoscopy for follow up of prior gastric ulcer as well as polyp surveillance (19 polyps on last procedure) and only fair preparation.  The risks and benefits of endoscopic evaluation/treatment were  discussed with the patient and/or family; these include but are not limited to the risk of perforation, infection, bleeding, missed lesions, lack of diagnosis, severe illness requiring hospitalization, as well as anesthesia and sedation related illnesses.  The patient's history has been reviewed, patient examined, no change in status, and deemed stable for procedure.  The patient and/or family is agreeable to proceed.    Justice Britain, MD [facility] Office # [phone]

## 2021-06-25 NOTE — Progress Notes (Signed)
Called to room to assist during endoscopic procedure.  Patient ID and intended procedure confirmed with present staff. Received instructions for my participation in the procedure from the performing physician.  

## 2021-06-25 NOTE — Op Note (Signed)
[facility] Patient Name: Wesley Bright Procedure Date: 06/25/2021 2:16 PM MRN: [id number] Endoscopist: Justice Britain , MD Age: 71 Referring MD:  Date of Birth: October 29, 1950 Gender: Male Account #: [id number] Procedure:                Colonoscopy Indications:              Surveillance: History of numerous (> 10) adenomas                            on last colonoscopy (< 3 yrs) Medicines:                Monitored Anesthesia Care Procedure:                Pre-Anesthesia Assessment:                           - Prior to the procedure, a History and Physical                            was performed, and patient medications and                            allergies were reviewed. The patient's tolerance of                            previous anesthesia was also reviewed. The risks                            and benefits of the procedure and the sedation                            options and risks were discussed with the patient.                            All questions were answered, and informed consent                            was obtained. Prior Anticoagulants: The patient has                            taken no previous anticoagulant or antiplatelet                            agents. ASA Grade Assessment: III - A patient with                            severe systemic disease. After reviewing the risks                            and benefits, the patient was deemed in                            satisfactory condition to undergo the procedure.  After obtaining informed consent, the colonoscope                            was passed under direct vision. Throughout the                            procedure, the patient's blood pressure, pulse, and                            oxygen saturations were monitored continuously. The                            CF [id number] #[id number] was introduced through the anus                            and advanced to the 5 cm  into the ileum. The                            colonoscopy was performed without difficulty. The                            patient tolerated the procedure. The quality of the                            bowel preparation was adequate. The terminal ileum,                            ileocecal valve, appendiceal orifice, and rectum                            were photographed. Scope In: 2:38:46 PM Scope Out: 3:01:41 PM Scope Withdrawal Time: 0 hours 18 minutes 9 seconds  Total Procedure Duration: 0 hours 22 minutes 55 seconds  Findings:                 Skin tags were found on perianal exam.                           The digital rectal exam findings include                            hemorrhoids. Pertinent negatives include no                            palpable rectal lesions.                           A large amount of liquid stool was found in the                            entire colon, interfering with visualization.                            Lavage of the area was performed using copious  amounts, resulting in clearance with adequate                            visualization.                           The terminal ileum and ileocecal valve appeared                            normal.                           Two sessile polyps were found in the rectum and                            recto-sigmoid colon. The polyps were 2 to 9 mm in                            size. These polyps were removed with a cold snare.                            Resection and retrieval were complete.                           Multiple small-mouthed diverticula were found in                            the recto-sigmoid colon, sigmoid colon and                            descending colon.                           Normal mucosa was found in the entire colon                            otherwise.                           Non-bleeding non-thrombosed external and internal                             hemorrhoids were found during retroflexion, during                            perianal exam and during digital exam. The                            hemorrhoids were Grade II (internal hemorrhoids                            that prolapse but reduce spontaneously). Complications:            No immediate complications. Estimated Blood Loss:     Estimated blood loss was minimal. Impression:               - Perianal skin  tags found on perianal exam.                           - Hemorrhoids found on digital rectal exam.                           - Stool in the entire examined colon - lavaged with                            adequate visualization.                           - The examined portion of the ileum was normal.                           - Two 2 to 9 mm polyps in the rectum and at the                            recto-sigmoid colon, removed with a cold snare.                            Resected and retrieved.                           - Diverticulosis in the recto-sigmoid colon, in the                            sigmoid colon and in the descending colon.                           - Normal mucosa in the entire examined colon                            otherwise.                           - Non-bleeding non-thrombosed external and internal                            hemorrhoids. Recommendation:           - The patient will be observed post-procedure,                            until all discharge criteria are met.                           - Discharge patient to home.                           - Patient has a contact number available for                            emergencies. The signs and symptoms of potential  delayed complications were discussed with the                            patient. Return to normal activities tomorrow.                            Written discharge instructions were provided to the                            patient.                            - High fiber diet.                           - Use FiberCon 1-2 tablets PO daily.                           - Continue present medications.                           - Await pathology results.                           - Repeat colonoscopy in 3 years for surveillance                            due to overall number of polyps removed within the                            last year. Recommend daily Miralax (x1 week) + the                            2-day preparation used for this procedure for                            today's procedur for next procedure.                           - The findings and recommendations were discussed                            with the patient.                           - The findings and recommendations were discussed                            with the patient's family. Justice Britain, MD 06/25/2021 3:14:37 PM

## 2021-06-25 NOTE — Op Note (Signed)
[facility] Patient Name: Wesley Bright Procedure Date: 06/25/2021 2:16 PM MRN: [id number] Endoscopist: Justice Britain , MD Age: 71 Referring MD:  Date of Birth: [birth year] Gender: Male Account #: [id number] Procedure:                Upper GI endoscopy Indications:              Follow-up of gastric ulcer Medicines:                Monitored Anesthesia Care Procedure:                Pre-Anesthesia Assessment:                           - Prior to the procedure, a History and Physical                            was performed, and patient medications and                            allergies were reviewed. The patient's tolerance of                            previous anesthesia was also reviewed. The risks                            and benefits of the procedure and the sedation                            options and risks were discussed with the patient.                            All questions were answered, and informed consent                            was obtained. Prior Anticoagulants: The patient has                            taken no previous anticoagulant or antiplatelet                            agents. ASA Grade Assessment: III - A patient with                            severe systemic disease. After reviewing the risks                            and benefits, the patient was deemed in                            satisfactory condition to undergo the procedure.                           After obtaining informed consent, the endoscope was  passed under direct vision. Throughout the                            procedure, the patient's blood pressure, pulse, and                            oxygen saturations were monitored continuously. The                            Endoscope was introduced through the mouth, and                            advanced to the second part of duodenum. The upper                            GI endoscopy was  accomplished without difficulty.                            The patient tolerated the procedure. Scope In: Scope Out: Findings:                 No gross lesions were noted in the entire esophagus.                           The Z-line was irregular and was found 39 cm from                            the incisors.                           A 1 cm hiatal hernia was present.                           A single 12 mm mucosal nodule with no bleeding and                            no stigmata of recent bleeding was found in the                            cardia. Biopsies were taken with a cold forceps for                            histology - though suspect this is more likely                            nodular gastritis.                           Patchy mild inflammation characterized by erosions,                            erythema and granularity was found in the entire  examined stomach. Biopsies were taken with a cold                            forceps for histology and Helicobacter pylori                            testing.                           No gross lesions were noted in the duodenal bulb,                            in the first portion of the duodenum and in the                            second portion of the duodenum. Complications:            No immediate complications. Estimated Blood Loss:     Estimated blood loss was minimal. Impression:               - No gross lesions in esophagus. Z-line irregular,                            39 cm from the incisors.                           - 1 cm hiatal hernia.                           - A single mucosal nodule found on the cardia -                            biopsied.                           - Gastritis in rest of the stomach. Biopsied.                           - Previous gastric ulcers have healed.                           - No gross lesions in the duodenal bulb, in the                             first portion of the duodenum and in the second                            portion of the duodenum. Recommendation:           - Proceed to scheduled colonoscopy.                           - Restart PPI - Omeprazole 40 mg daily.                           - Await pathology results.                           -  Observe patient's clinical course.                           - The findings and recommendations were discussed                            with the patient.                           - The findings and recommendations were discussed                            with the patient's family. Justice Britain, MD 06/25/2021 3:09:39 PM

## 2021-06-25 NOTE — Progress Notes (Signed)
Pt's states no medical or surgical changes since previsit or office visit. 

## 2021-06-27 ENCOUNTER — Telehealth: Payer: Self-pay

## 2021-06-27 NOTE — Telephone Encounter (Signed)
Left message on answering machine. 

## 2021-06-30 ENCOUNTER — Encounter: Payer: Self-pay | Admitting: Gastroenterology

## 2021-07-16 DIAGNOSIS — H2513 Age-related nuclear cataract, bilateral: Secondary | ICD-10-CM | POA: Diagnosis not present

## 2021-08-14 ENCOUNTER — Ambulatory Visit: Payer: Self-pay | Admitting: Cardiology

## 2021-08-14 NOTE — Progress Notes (Signed)
No show

## 2021-10-22 ENCOUNTER — Other Ambulatory Visit: Payer: Self-pay | Admitting: Oncology

## 2021-10-28 ENCOUNTER — Other Ambulatory Visit: Payer: Self-pay

## 2021-10-28 DIAGNOSIS — C9110 Chronic lymphocytic leukemia of B-cell type not having achieved remission: Secondary | ICD-10-CM

## 2021-10-29 ENCOUNTER — Other Ambulatory Visit: Payer: Self-pay

## 2021-10-29 ENCOUNTER — Inpatient Hospital Stay: Payer: PPO | Attending: Hematology | Admitting: Hematology

## 2021-10-29 ENCOUNTER — Inpatient Hospital Stay: Payer: PPO

## 2021-10-29 VITALS — BP 136/75 | HR 74 | Temp 98.2°F | Resp 18 | Ht 72.0 in | Wt 188.4 lb

## 2021-10-29 DIAGNOSIS — N189 Chronic kidney disease, unspecified: Secondary | ICD-10-CM | POA: Insufficient documentation

## 2021-10-29 DIAGNOSIS — C9110 Chronic lymphocytic leukemia of B-cell type not having achieved remission: Secondary | ICD-10-CM | POA: Diagnosis not present

## 2021-10-29 LAB — CBC WITH DIFFERENTIAL (CANCER CENTER ONLY)
Abs Immature Granulocytes: 0.01 10*3/uL (ref 0.00–0.07)
Basophils Absolute: 0.1 10*3/uL (ref 0.0–0.1)
Basophils Relative: 1 %
Eosinophils Absolute: 0.2 10*3/uL (ref 0.0–0.5)
Eosinophils Relative: 2 %
HCT: 51.9 % (ref 39.0–52.0)
Hemoglobin: 17.7 g/dL — ABNORMAL HIGH (ref 13.0–17.0)
Immature Granulocytes: 0 %
Lymphocytes Relative: 25 %
Lymphs Abs: 1.7 10*3/uL (ref 0.7–4.0)
MCH: 30.5 pg (ref 26.0–34.0)
MCHC: 34.1 g/dL (ref 30.0–36.0)
MCV: 89.5 fL (ref 80.0–100.0)
Monocytes Absolute: 0.6 10*3/uL (ref 0.1–1.0)
Monocytes Relative: 9 %
Neutro Abs: 4.4 10*3/uL (ref 1.7–7.7)
Neutrophils Relative %: 63 %
Platelet Count: 159 10*3/uL (ref 150–400)
RBC: 5.8 MIL/uL (ref 4.22–5.81)
RDW: 13.6 % (ref 11.5–15.5)
WBC Count: 7 10*3/uL (ref 4.0–10.5)
nRBC: 0 % (ref 0.0–0.2)

## 2021-10-29 LAB — CMP (CANCER CENTER ONLY)
ALT: 13 U/L (ref 0–44)
AST: 15 U/L (ref 15–41)
Albumin: 4 g/dL (ref 3.5–5.0)
Alkaline Phosphatase: 113 U/L (ref 38–126)
Anion gap: 6 (ref 5–15)
BUN: 17 mg/dL (ref 8–23)
CO2: 25 mmol/L (ref 22–32)
Calcium: 9 mg/dL (ref 8.9–10.3)
Chloride: 110 mmol/L (ref 98–111)
Creatinine: 1.75 mg/dL — ABNORMAL HIGH (ref 0.61–1.24)
GFR, Estimated: 42 mL/min — ABNORMAL LOW (ref >60)
Glucose, Bld: 79 mg/dL (ref 70–99)
Potassium: 4.1 mmol/L (ref 3.5–5.1)
Sodium: 141 mmol/L (ref 135–145)
Total Bilirubin: 0.5 mg/dL (ref 0.3–1.2)
Total Protein: 7.1 g/dL (ref 6.5–8.1)

## 2021-10-29 LAB — LACTATE DEHYDROGENASE: LDH: 121 U/L (ref 98–192)

## 2021-10-29 NOTE — Progress Notes (Signed)
HEMATOLOGY/ONCOLOGY CLINIC NOTE  Date of Service: 10/29/2021   Patient Care Team: Wesley Bright, [place] as PCP - General (Family Medicine)  CHIEF COMPLAINTS/PURPOSE OF CONSULTATION:  Follow-up for continued management of chronic lymphocytic leukemia.  Oncologic History:  [name] with CLL after a CLL FISH study revealed heterozygous 13q deletion within 92% cells. Subsequent imaging showed diffuse non-bulky lymphadenopathy in chest, abdomen, and pelvis with splenomegaly of 1200 cm^3. The pt began daily Ibrutinib '420mg'$  on 01/27/17 after becoming increasing fatigued and lost 15 pounds of weight over 4-5 months.  He has now been off ibrutinib for more than 6 months.  He was treated with Rituxan weekly x4 to try to get a deeper response and achieved CR prior to discontinuation of his ibrutinib.  HISTORY OF PRESENTING ILLNESS:  Please see previous note for details on initial presentation  Interval History:   Wesley Bright is here for follow-up of his CLL after his last clinic visit on 04/30/2021.  He is accompanied by his wife for this visit. He reports He is doing well with no new symptoms or concerns.  He reports that he smokes roughly half a pack or 10 cigarettes per day.  Labs done today were reviewed in detail.  No new lumps, bumps, or lesions/rashes. No other new or acute focal symptoms.  We discussed his recent upper endoscopy done 06/25/2021 which showed no legions and some gastritis. We also discussed his colonoscopy done same day which revealed grade 2 internal hemorrhoids and 2 polyps which were biopsied.   MEDICAL HISTORY:  Past Medical History:  Diagnosis Date   External hemorrhoid    Hypertension    Leukemia ([place]) 2018    SURGICAL HISTORY: Past Surgical History:  Procedure Laterality Date   APPENDECTOMY     COLONOSCOPY  05/30/2020   Dr.Mansouraty   INGUINAL HERNIA REPAIR     left   KNEE SURGERY     Left Knee   UPPER  GASTROINTESTINAL ENDOSCOPY  05/30/2020   Dr.Mansouraty    SOCIAL HISTORY: Social History   Socioeconomic History   Marital status: Married    Spouse name: Not on file   Number of children: 2   Years of education: Not on file   Highest education level: Not on file  Occupational History   Occupation: Buyer, retail: [employer]  Tobacco Use   Smoking status: Every Day    Packs/day: 0.50    Years: 50.00    Total pack years: 25.00    Types: Cigarettes   Smokeless tobacco: Never  Vaping Use   Vaping Use: Never used  Substance and Sexual Activity   Alcohol use: Yes    Comment: occasional   Drug use: No   Sexual activity: Not on file  Other Topics Concern   Not on file  Social History Narrative   Not on file   Social Determinants of Health   Financial Resource Strain: Not on file  Food Insecurity: Not on file  Transportation Needs: Not on file  Physical Activity: Not on file  Stress: Not on file  Social Connections: Not on file  Intimate Partner Violence: Not on file    FAMILY HISTORY: Family History  Problem Relation Age of Onset   Cancer Mother        Bladder - cause of death   Esophageal cancer Father    Cancer Father        Throat    Liver cancer  Brother    Cancer Brother        Liver   Pancreatic cancer Maternal Uncle    Stomach cancer Maternal Uncle    Pancreatic cancer Paternal Uncle    Colon cancer Neg Hx    Inflammatory bowel disease Neg Hx    Rectal cancer Neg Hx     ALLERGIES:  has No Known Allergies.  MEDICATIONS:  Current Outpatient Medications  Medication Sig Dispense Refill   Ascorbic Acid (VITAMIN C PO) Take by mouth.     Cholecalciferol (VITAMIN D3 PO) Take by mouth.     Cyanocobalamin (VITAMIN B-12 PO) Take by mouth.     omeprazole (PRILOSEC) 40 MG capsule Take 1 capsule (40 mg total) by mouth daily. 30 capsule 6   Pyridoxine HCl (VITAMIN B-6 PO) Take by mouth.     No current facility-administered medications for  this visit.    REVIEW OF SYSTEMS:   .10 Point review of Systems was done is negative except as noted above.  PHYSICAL EXAMINATION:  Vitals:   10/29/21 1449  BP: 136/75  Pulse: 74  Resp: 18  Temp: 98.2 F (36.8 C)  SpO2: 96%   Filed Weights   10/29/21 1449  Weight: 188 lb 6.4 oz (85.5 kg)   .Body mass index is 25.55 kg/m.  NAD GENERAL:alert, in no acute distress and comfortable SKIN: no acute rashes, no significant lesions EYES: conjunctiva are pink and non-injected, sclera anicteric NECK: supple, no JVD LYMPH:  no palpable lymphadenopathy in the cervical, axillary or inguinal regions LUNGS: clear to auscultation b/l with normal respiratory effort HEART: regular rate & rhythm ABDOMEN:  normoactive bowel sounds , non tender, not distended. Extremity: no pedal edema PSYCH: alert & oriented x 3 with fluent speech NEURO: no focal motor/sensory deficits  LABORATORY DATA:  I have reviewed the data as listed  .    Latest Ref Rng & Units 10/29/2021    2:29 PM 04/30/2021    2:38 PM 02/04/2021    3:39 AM  CBC  WBC 4.0 - 10.5 K/uL 7.0  6.6  5.3   Hemoglobin 13.0 - 17.0 g/dL 17.7  15.5  15.1   Hematocrit 39.0 - 52.0 % 51.9  45.8  45.7   Platelets 150 - 400 K/uL 159  230  117     .    Latest Ref Rng & Units 04/30/2021    2:38 PM 02/04/2021    3:39 AM 02/03/2021    1:35 PM  CMP  Glucose 70 - 99 mg/dL 78  92  97   BUN 8 - 23 mg/dL '18  16  17   '$ Creatinine 0.61 - 1.24 mg/dL 1.70  1.20  1.58   Sodium 135 - 145 mmol/L 142  139  138   Potassium 3.5 - 5.1 mmol/L 4.6  3.8  4.3   Chloride 98 - 111 mmol/L 110  109  105   CO2 22 - 32 mmol/L '25  23  25   '$ Calcium 8.9 - 10.3 mg/dL 8.5  8.4  8.9   Total Protein 6.5 - 8.1 g/dL 6.4   6.6   Total Bilirubin 0.3 - 1.2 mg/dL 0.4   0.5   Alkaline Phos 38 - 126 U/L 108   99   AST 15 - 41 U/L 16   13   ALT 0 - 44 U/L 14   15    . Lab Results  Component Value Date   LDH 117 04/30/2021    06/20/2019 Kidney  Biopsy:    12/31/16 Flow  Cytometry:    12/15/16 CLL FISH:    RADIOGRAPHIC STUDIES: I have personally reviewed the radiological images as listed and agreed with the findings in the report. No results found.  ASSESSMENT & PLAN:  71 y.o. male with  1. Chronic Lymphocytic Leukemia - monoalleilic 60A deletion. Was started on Ibrutinib from 01/2017 due to fatigue, weight loss and significant splenomegaly. S/p Rituxan x 4 in 2021. Currently on active surveillance and off ibrutinib after receiving Rituxan.  2. H/o Grade 1 intermittent rash ?related to Ibrutinib - no currently an issue.  Off ibrutinib  3.  Chronic kidney disease likely related to hypertensive arteriolosclerosis. And possible obstructive uropathy. -Patient had a presyncopal episode at work and stopped taking all his medications.  He notes he is feeling well without taking his medications and so he did not restart these. -Patient was recommended to follow-up with his primary care physician for management of his antihypertensives and other medications. -He was recommended to maintain fluid intake with water intake of at least 2 L a day.  3.  Patient Active Problem List   Diagnosis Date Noted   Mixed hyperlipidemia 02/14/2021   Exertional dyspnea    Syncope and collapse 02/03/2021   Hx of adenomatous colonic polyps 04/09/2020   Abdominal distension 04/09/2020   Bloating 04/09/2020   History of duodenitis 04/09/2020   Counseling regarding advance care planning and goals of care 01/08/2020   Skin rash 10/03/2017   CLL (chronic lymphocytic leukemia) ([place]) 12/09/2016   General weakness 12/07/2016   Smoker 12/07/2016   Loss of weight 12/07/2016   GERD (gastroesophageal reflux disease) 12/30/2010   Chronic constipation 12/30/2010   Hemorrhoids, external 12/30/2010   PLAN:   -Patients labs were reviewed with him today. CBC remains within normal limits. CMP shows elevated creatinine of 1.75 and reduced GFR est of 42 which is consistent with  chronic kidney disease. Otherwise unremarkable. LDH within normal limits. -Patient has no clinical symptoms suggestive of CLL progression at this time. -No lab findings suggestive of CLL progression at this time. -No indication for additional treatment of the patient's CLL at this time. -We discussed his recent upper endoscopy done 06/25/2021 which showed no legions and some gastritis. We also discussed his colonoscopy done same day which revealed grade 2 internal hemorrhoids and 2 polyps which were biopsied.  -conitnue f/u ithe PCP and nephrology for mx of HTN and CKD -RTC in 6 months with labs.  FOLLOW UP: RTC with Dr Irene Limbo with labs in 6 months  .The total time spent in the appointment was 20 minutes* .  All of the patient's questions were answered with apparent satisfaction. The patient knows to call the clinic with any problems, questions or concerns.   Sullivan Lone MD MS AAHIVMS [facility] Hematology/Oncology Physician [facility]  .*Total Encounter Time as defined by the Centers for Medicare and Medicaid Services includes, in addition to the face-to-face time of a patient visit (documented in the note above) non-face-to-face time: obtaining and reviewing outside history, ordering and reviewing medications, tests or procedures, care coordination (communications with other health care professionals or caregivers) and documentation in the medical record.  I, Melene Muller, am acting as scribe for Dr. Sullivan Lone, MD.  .I have reviewed the above documentation for accuracy and completeness, and I agree with the above. Brunetta Genera MD

## 2021-12-25 DIAGNOSIS — C9110 Chronic lymphocytic leukemia of B-cell type not having achieved remission: Secondary | ICD-10-CM | POA: Diagnosis not present

## 2021-12-25 DIAGNOSIS — R3129 Other microscopic hematuria: Secondary | ICD-10-CM | POA: Diagnosis not present

## 2021-12-25 DIAGNOSIS — N1832 Chronic kidney disease, stage 3b: Secondary | ICD-10-CM | POA: Diagnosis not present

## 2021-12-25 DIAGNOSIS — I129 Hypertensive chronic kidney disease with stage 1 through stage 4 chronic kidney disease, or unspecified chronic kidney disease: Secondary | ICD-10-CM | POA: Diagnosis not present

## 2021-12-25 DIAGNOSIS — N401 Enlarged prostate with lower urinary tract symptoms: Secondary | ICD-10-CM | POA: Diagnosis not present

## 2021-12-25 DIAGNOSIS — R809 Proteinuria, unspecified: Secondary | ICD-10-CM | POA: Diagnosis not present

## 2022-02-04 DIAGNOSIS — M25572 Pain in left ankle and joints of left foot: Secondary | ICD-10-CM | POA: Diagnosis not present

## 2022-02-04 DIAGNOSIS — M79672 Pain in left foot: Secondary | ICD-10-CM | POA: Diagnosis not present

## 2022-02-06 DIAGNOSIS — M79672 Pain in left foot: Secondary | ICD-10-CM | POA: Diagnosis not present

## 2022-02-11 DIAGNOSIS — S92235A Nondisplaced fracture of intermediate cuneiform of left foot, initial encounter for closed fracture: Secondary | ICD-10-CM | POA: Diagnosis not present

## 2022-02-11 DIAGNOSIS — S93622A Sprain of tarsometatarsal ligament of left foot, initial encounter: Secondary | ICD-10-CM | POA: Diagnosis not present

## 2022-03-04 DIAGNOSIS — S92215D Nondisplaced fracture of cuboid bone of left foot, subsequent encounter for fracture with routine healing: Secondary | ICD-10-CM | POA: Diagnosis not present

## 2022-04-28 ENCOUNTER — Other Ambulatory Visit: Payer: Self-pay

## 2022-04-28 DIAGNOSIS — C9110 Chronic lymphocytic leukemia of B-cell type not having achieved remission: Secondary | ICD-10-CM

## 2022-04-29 ENCOUNTER — Inpatient Hospital Stay: Payer: PPO | Attending: Hematology

## 2022-04-29 ENCOUNTER — Inpatient Hospital Stay: Payer: PPO | Admitting: Hematology

## 2022-04-29 ENCOUNTER — Other Ambulatory Visit: Payer: Self-pay

## 2022-04-29 ENCOUNTER — Inpatient Hospital Stay: Payer: PPO

## 2022-04-29 VITALS — BP 128/91 | HR 77 | Temp 97.7°F | Resp 18 | Ht 72.0 in | Wt 188.8 lb

## 2022-04-29 DIAGNOSIS — C9110 Chronic lymphocytic leukemia of B-cell type not having achieved remission: Secondary | ICD-10-CM

## 2022-04-29 DIAGNOSIS — D751 Secondary polycythemia: Secondary | ICD-10-CM

## 2022-04-29 DIAGNOSIS — N189 Chronic kidney disease, unspecified: Secondary | ICD-10-CM | POA: Diagnosis not present

## 2022-04-29 LAB — CBC WITH DIFFERENTIAL (CANCER CENTER ONLY)
Abs Immature Granulocytes: 0.01 10*3/uL (ref 0.00–0.07)
Basophils Absolute: 0.1 10*3/uL (ref 0.0–0.1)
Basophils Relative: 1 %
Eosinophils Absolute: 0.2 10*3/uL (ref 0.0–0.5)
Eosinophils Relative: 2 %
HCT: 54.1 % — ABNORMAL HIGH (ref 39.0–52.0)
Hemoglobin: 18.1 g/dL — ABNORMAL HIGH (ref 13.0–17.0)
Immature Granulocytes: 0 %
Lymphocytes Relative: 32 %
Lymphs Abs: 2.3 10*3/uL (ref 0.7–4.0)
MCH: 29.6 pg (ref 26.0–34.0)
MCHC: 33.5 g/dL (ref 30.0–36.0)
MCV: 88.5 fL (ref 80.0–100.0)
Monocytes Absolute: 0.7 10*3/uL (ref 0.1–1.0)
Monocytes Relative: 9 %
Neutro Abs: 4.1 10*3/uL (ref 1.7–7.7)
Neutrophils Relative %: 56 %
Platelet Count: 181 10*3/uL (ref 150–400)
RBC: 6.11 MIL/uL — ABNORMAL HIGH (ref 4.22–5.81)
RDW: 13.4 % (ref 11.5–15.5)
WBC Count: 7.3 10*3/uL (ref 4.0–10.5)
nRBC: 0 % (ref 0.0–0.2)

## 2022-04-29 LAB — CMP (CANCER CENTER ONLY)
ALT: 9 U/L (ref 0–44)
AST: 13 U/L — ABNORMAL LOW (ref 15–41)
Albumin: 4.1 g/dL (ref 3.5–5.0)
Alkaline Phosphatase: 134 U/L — ABNORMAL HIGH (ref 38–126)
Anion gap: 8 (ref 5–15)
BUN: 15 mg/dL (ref 8–23)
CO2: 23 mmol/L (ref 22–32)
Calcium: 9.4 mg/dL (ref 8.9–10.3)
Chloride: 107 mmol/L (ref 98–111)
Creatinine: 1.43 mg/dL — ABNORMAL HIGH (ref 0.61–1.24)
GFR, Estimated: 53 mL/min — ABNORMAL LOW (ref >60)
Glucose, Bld: 98 mg/dL (ref 70–99)
Potassium: 4.3 mmol/L (ref 3.5–5.1)
Sodium: 138 mmol/L (ref 135–145)
Total Bilirubin: 0.5 mg/dL (ref 0.3–1.2)
Total Protein: 7 g/dL (ref 6.5–8.1)

## 2022-04-29 LAB — LACTATE DEHYDROGENASE: LDH: 122 U/L (ref 98–192)

## 2022-04-29 NOTE — Progress Notes (Signed)
HEMATOLOGY/ONCOLOGY CLINIC NOTE  Date of Service: 04/29/2022   Patient Care Team: [name], [place] as PCP - General (Family Medicine)  CHIEF COMPLAINTS/PURPOSE OF CONSULTATION:  Follow-up for continued management of chronic lymphocytic leukemia.  Oncologic History:  [name] Bright was initially diagnosed with CLL after a CLL FISH study revealed heterozygous 13q deletion within 92% cells. Subsequent imaging showed diffuse non-bulky lymphadenopathy in chest, abdomen, and pelvis with splenomegaly of 1200 cm^3. The pt began daily Ibrutinib '420mg'$  on 01/27/17 after becoming increasing fatigued and lost 15 pounds of weight over 4-5 months.  He has now been off ibrutinib for more than 6 months.  He was treated with Rituxan weekly x4 to try to get a deeper response and achieved CR prior to discontinuation of his ibrutinib.  HISTORY OF PRESENTING ILLNESS:  Please see previous note for details on initial presentation  Interval History:   Wesley Bright Bright is here for follow-up of his CLL.   Patient was last seen by me on 10/29/2021 and patient reported she was doing well overall.   Patient is here with his wife during this visit and notes he has been doing fairly well since our last visit. He reports he recently had a sudden fall down the stairs at home in September. He notes that he passed out when he fell and does not remember why and how he fell. He had hand and leg injury due to the fall. He passed out for about one minute and woke up with shortness of breath. His wife notes that he was having falls like this one in the past when they were working. His wife notes that he was seen by neurologist in the past for his previous falls and notes everything was fine.   His wife notes he has seen his cardiologist and neurologist for the recent fall and had MRI scans, CT scans, and EKG. His wife reports every scans were normal and the physicians could not conclude why he passed out.   He  complains of cough with phlegm and occasional chest pain, He denies back pain, shortness of breath, weight loss, new lumps/bumps, fever, chills, abdominal pain, loss of appetite, or leg swelling. Patient notes he staying well hydrated, around 2 L per day.   Patient notes he used to donate blood at his home country. He tried to donate blood when hew moved to [place] but was not been able to because he had Mad Cow Disease previously.  He is smoking cigarettes again, around a pack a day.      MEDICAL HISTORY:  Past Medical History:  Diagnosis Date   External hemorrhoid    Hypertension    Leukemia ([place]) 2018    SURGICAL HISTORY: Past Surgical History:  Procedure Laterality Date   APPENDECTOMY     COLONOSCOPY  05/30/2020   Dr.Mansouraty   INGUINAL HERNIA REPAIR     left   KNEE SURGERY     Left Knee   UPPER GASTROINTESTINAL ENDOSCOPY  05/30/2020   Dr.Mansouraty    SOCIAL HISTORY: Social History   Socioeconomic History   Marital status: Married    Spouse name: Not on file   Number of children: 2   Years of education: Not on file   Highest education level: Not on file  Occupational History   Occupation: Librarian, academic    Employer: [employer]  Tobacco Use   Smoking status: Every Day    Packs/day: 0.50    Years: 50.00  Total pack years: 25.00    Types: Cigarettes   Smokeless tobacco: Never  Vaping Use   Vaping Use: Never used  Substance and Sexual Activity   Alcohol use: Yes    Comment: occasional   Drug use: No   Sexual activity: Not on file  Other Topics Concern   Not on file  Social History Narrative   Not on file   Social Determinants of Health   Financial Resource Strain: Not on file  Food Insecurity: Not on file  Transportation Needs: Not on file  Physical Activity: Not on file  Stress: Not on file  Social Connections: Not on file  Intimate Partner Violence: Not on file    FAMILY HISTORY: Family History  Problem Relation Age of Onset    Cancer Mother        Bladder - cause of death   Esophageal cancer Father    Cancer Father        Throat    Liver cancer Brother    Cancer Brother        Liver   Pancreatic cancer Maternal Uncle    Stomach cancer Maternal Uncle    Pancreatic cancer Paternal Uncle    Colon cancer Neg Hx    Inflammatory bowel disease Neg Hx    Rectal cancer Neg Hx     ALLERGIES:  has No Known Allergies.  MEDICATIONS:  Current Outpatient Medications  Medication Sig Dispense Refill   Ascorbic Acid (VITAMIN C PO) Take by mouth.     Cholecalciferol (VITAMIN D3 PO) Take by mouth.     Cyanocobalamin (VITAMIN B-12 PO) Take by mouth.     omeprazole (PRILOSEC) 40 MG capsule Take 1 capsule (40 mg total) by mouth daily. 30 capsule 6   Pyridoxine HCl (VITAMIN B-6 PO) Take by mouth.     No current facility-administered medications for this visit.    REVIEW OF SYSTEMS:   .10 Point review of Systems was done is negative except as noted above.  PHYSICAL EXAMINATION:  Vitals:   04/29/22 1352  BP: (!) 128/91  Pulse: 77  Resp: 18  Temp: 97.7 F (36.5 C)  SpO2: 98%    Filed Weights   04/29/22 1352  Weight: 188 lb 12.8 oz (85.6 kg)    .Body mass index is 25.61 kg/m.  NAD GENERAL:alert, in no acute distress and comfortable SKIN: no acute rashes, no significant lesions EYES: conjunctiva are pink and non-injected, sclera anicteric NECK: supple, no JVD LYMPH:  no palpable lymphadenopathy in the cervical, axillary or inguinal regions LUNGS: clear to auscultation b/l with normal respiratory effort HEART: regular rate & rhythm ABDOMEN:  normoactive bowel sounds , non tender, not distended. Extremity: no pedal edema PSYCH: alert & oriented x 3 with fluent speech NEURO: no focal motor/sensory deficits  LABORATORY DATA:  I have reviewed the data as listed  .    Latest Ref Rng & Units 04/29/2022    1:09 PM 10/29/2021    2:29 PM 04/30/2021    2:38 PM  CBC  WBC 4.0 - 10.5 K/uL 7.3  7.0  6.6    Hemoglobin 13.0 - 17.0 g/dL 18.1  17.7  15.5   Hematocrit 39.0 - 52.0 % 54.1  51.9  45.8   Platelets 150 - 400 K/uL 181  159  230     .    Latest Ref Rng & Units 04/29/2022    1:09 PM 10/29/2021    2:29 PM 04/30/2021    2:38 PM  CMP  Glucose 70 - 99 mg/dL 98  79  78   BUN 8 - 23 mg/dL '15  17  18   '$ Creatinine 0.61 - 1.24 mg/dL 1.43  1.75  1.70   Sodium 135 - 145 mmol/L 138  141  142   Potassium 3.5 - 5.1 mmol/L 4.3  4.1  4.6   Chloride 98 - 111 mmol/L 107  110  110   CO2 22 - 32 mmol/L '23  25  25   '$ Calcium 8.9 - 10.3 mg/dL 9.4  9.0  8.5   Total Protein 6.5 - 8.1 g/dL 7.0  7.1  6.4   Total Bilirubin 0.3 - 1.2 mg/dL 0.5  0.5  0.4   Alkaline Phos 38 - 126 U/L 134  113  108   AST 15 - 41 U/L '13  15  16   '$ ALT 0 - 44 U/L '9  13  14    '$ . Lab Results  Component Value Date   LDH 121 10/29/2021    06/20/2019 Kidney Biopsy:    12/31/16 Flow Cytometry:    12/15/16 CLL FISH:    RADIOGRAPHIC STUDIES: I have personally reviewed the radiological images as listed and agreed with the findings in the report. No results found.  ASSESSMENT & PLAN:  71 y.o. male with  1. Chronic Lymphocytic Leukemia - monoalleilic 88Q deletion. Was started on Ibrutinib from 01/2017 due to fatigue, weight loss and significant splenomegaly. S/p Rituxan x 4 in 2021. Currently on active surveillance and off ibrutinib after receiving Rituxan.  2. H/o Grade 1 intermittent rash ?related to Ibrutinib - no currently an issue.  Off ibrutinib  3.  Chronic kidney disease likely related to hypertensive arteriolosclerosis. And possible obstructive uropathy. -Patient had a presyncopal episode at work and stopped taking all his medications.  He notes he is feeling well without taking his medications and so he did not restart these. -Patient was recommended to follow-up with his primary care physician for management of his antihypertensives and other medications. -He was recommended to maintain fluid intake with  water intake of at least 2 L a day.  3.  Patient Active Problem List   Diagnosis Date Noted   Mixed hyperlipidemia 02/14/2021   Exertional dyspnea    Syncope and collapse 02/03/2021   Hx of adenomatous colonic polyps 04/09/2020   Abdominal distension 04/09/2020   Bloating 04/09/2020   History of duodenitis 04/09/2020   Counseling regarding advance care planning and goals of care 01/08/2020   Skin rash 10/03/2017   CLL (chronic lymphocytic leukemia) ([place]) 12/09/2016   General weakness 12/07/2016   Smoker 12/07/2016   Loss of weight 12/07/2016   GERD (gastroesophageal reflux disease) 12/30/2010   Chronic constipation 12/30/2010   Hemorrhoids, external 12/30/2010   PLAN:   -Discussed lab results from today with the patient and his wife. CBC shows elevated hemoglobin of 18.1 K and hematocrit of 54.1. CMP stable, just shows slightly elevated creatinine of 1.43. -Discussed what could cause the elevated hemoglobin: Smoking and dehydration. -Discussed the need for blood test to see if it elevated hemoglobin is due to bone marrow problem. -Patient agrees to get the blood test. -educated smoking cessation.   -No lab findings suggestive of CLL progression at this time. -No indication for additional treatment of the patient's CLL at this time.  FOLLOW-UP: Additional labs today RTC with Dr Irene Limbo with labs in 4 months  The total time spent in the appointment was 25 minutes* .  All of  the patient's questions were answered with apparent satisfaction. The patient knows to call the clinic with any problems, questions or concerns.   Sullivan Lone MD MS AAHIVMS [facility] Hematology/Oncology Physician [facility]  .*Total Encounter Time as defined by the Centers for Medicare and Medicaid Services includes, in addition to the face-to-face time of a patient visit (documented in the note above) non-face-to-face time: obtaining and reviewing outside history, ordering and reviewing  medications, tests or procedures, care coordination (communications with other health care professionals or caregivers) and documentation in the medical record.   I, Cleda Mccreedy, am acting as a Education administrator for Sullivan Lone, MD.  .I have reviewed the above documentation for accuracy and completeness, and I agree with the above. Brunetta Genera MD

## 2022-05-05 LAB — JAK2 (INCLUDING V617F AND EXON 12), MPL,& CALR-NEXT GEN SEQ

## 2022-08-28 ENCOUNTER — Other Ambulatory Visit: Payer: Self-pay

## 2022-08-28 DIAGNOSIS — C9110 Chronic lymphocytic leukemia of B-cell type not having achieved remission: Secondary | ICD-10-CM

## 2022-08-31 ENCOUNTER — Other Ambulatory Visit: Payer: Self-pay

## 2022-08-31 ENCOUNTER — Inpatient Hospital Stay (HOSPITAL_BASED_OUTPATIENT_CLINIC_OR_DEPARTMENT_OTHER): Payer: PPO | Admitting: Hematology

## 2022-08-31 ENCOUNTER — Inpatient Hospital Stay: Payer: PPO | Attending: Nurse Practitioner

## 2022-08-31 VITALS — BP 138/82 | HR 74 | Temp 97.3°F | Resp 16 | Wt 197.6 lb

## 2022-08-31 DIAGNOSIS — D751 Secondary polycythemia: Secondary | ICD-10-CM | POA: Insufficient documentation

## 2022-08-31 DIAGNOSIS — N189 Chronic kidney disease, unspecified: Secondary | ICD-10-CM | POA: Insufficient documentation

## 2022-08-31 DIAGNOSIS — F17210 Nicotine dependence, cigarettes, uncomplicated: Secondary | ICD-10-CM | POA: Insufficient documentation

## 2022-08-31 DIAGNOSIS — C9110 Chronic lymphocytic leukemia of B-cell type not having achieved remission: Secondary | ICD-10-CM | POA: Insufficient documentation

## 2022-08-31 LAB — CMP (CANCER CENTER ONLY)
ALT: 10 U/L (ref 0–44)
AST: 11 U/L — ABNORMAL LOW (ref 15–41)
Albumin: 4.1 g/dL (ref 3.5–5.0)
Alkaline Phosphatase: 123 U/L (ref 38–126)
Anion gap: 7 (ref 5–15)
BUN: 16 mg/dL (ref 8–23)
CO2: 23 mmol/L (ref 22–32)
Calcium: 9.3 mg/dL (ref 8.9–10.3)
Chloride: 108 mmol/L (ref 98–111)
Creatinine: 1.46 mg/dL — ABNORMAL HIGH (ref 0.61–1.24)
GFR, Estimated: 51 mL/min — ABNORMAL LOW (ref >60)
Glucose, Bld: 77 mg/dL (ref 70–99)
Potassium: 3.9 mmol/L (ref 3.5–5.1)
Sodium: 138 mmol/L (ref 135–145)
Total Bilirubin: 0.6 mg/dL (ref 0.3–1.2)
Total Protein: 7.3 g/dL (ref 6.5–8.1)

## 2022-08-31 LAB — LACTATE DEHYDROGENASE: LDH: 172 U/L (ref 98–192)

## 2022-08-31 LAB — CBC WITH DIFFERENTIAL (CANCER CENTER ONLY)
Abs Immature Granulocytes: 0.01 10*3/uL (ref 0.00–0.07)
Basophils Absolute: 0.1 10*3/uL (ref 0.0–0.1)
Basophils Relative: 1 %
Eosinophils Absolute: 0.2 10*3/uL (ref 0.0–0.5)
Eosinophils Relative: 3 %
HCT: 51.8 % (ref 39.0–52.0)
Hemoglobin: 18 g/dL — ABNORMAL HIGH (ref 13.0–17.0)
Immature Granulocytes: 0 %
Lymphocytes Relative: 32 %
Lymphs Abs: 2.3 10*3/uL (ref 0.7–4.0)
MCH: 30.6 pg (ref 26.0–34.0)
MCHC: 34.7 g/dL (ref 30.0–36.0)
MCV: 87.9 fL (ref 80.0–100.0)
Monocytes Absolute: 0.7 10*3/uL (ref 0.1–1.0)
Monocytes Relative: 10 %
Neutro Abs: 3.9 10*3/uL (ref 1.7–7.7)
Neutrophils Relative %: 54 %
Platelet Count: 177 10*3/uL (ref 150–400)
RBC: 5.89 MIL/uL — ABNORMAL HIGH (ref 4.22–5.81)
RDW: 13.6 % (ref 11.5–15.5)
WBC Count: 7.2 10*3/uL (ref 4.0–10.5)
nRBC: 0 % (ref 0.0–0.2)

## 2022-08-31 NOTE — Progress Notes (Signed)
HEMATOLOGY/ONCOLOGY CLINIC NOTE  Date of Service: 08/31/2022   Patient Care Team: Wesley Bright, [place] as PCP - General (Family Medicine) Wesley Maine, MD as Consulting Physician (Hematology)  CHIEF COMPLAINTS/PURPOSE OF CONSULTATION:  Follow-up for continued management of chronic lymphocytic leukemia.  Oncologic History:  [name] with CLL after a CLL FISH study revealed heterozygous 13q deletion within 92% cells. Subsequent imaging showed diffuse non-bulky lymphadenopathy in chest, abdomen, and pelvis with splenomegaly of 1200 cm^3. The pt began daily Ibrutinib 420mg  on 01/27/17 after becoming increasing fatigued and lost 15 pounds of weight over 4-5 months.  He has now been off ibrutinib for more than 6 months.  He was treated with Rituxan weekly x4 to try to get a deeper response and achieved CR prior to discontinuation of his ibrutinib.  HISTORY OF PRESENTING ILLNESS:  Please see previous note for details on initial presentation  Interval History:   Wesley Bright is here for follow-up of his CLL.   Patient was last seen by me on 04/29/2022 and he complained of shortness of breath, cough with phlegm, and occasional chest pain. He noted that he had a recent fall which led to leg and hand injury.   Patient is accompanied by his wife during this visit. He reports he has been doing well overall since our last visit. He denies fever, chills, night sweats, unexpected weight loss, new lumps/bumps, back pain, chest pain, abdominal pain, abnormal bowel movement, or leg swelling.   He complains of dry cough, which is worse in the evening. Patient does report that his SOB has improved since our last visit.   Patient notes he is still smoking cigarettes since our last visit.    MEDICAL HISTORY:  Past Medical History:  Diagnosis Date   External hemorrhoid    Hypertension    Leukemia (HCC) 2018    SURGICAL HISTORY: Past Surgical History:   Procedure Laterality Date   APPENDECTOMY     COLONOSCOPY  05/30/2020   Dr.Mansouraty   INGUINAL HERNIA REPAIR     left   KNEE SURGERY     Left Knee   UPPER GASTROINTESTINAL ENDOSCOPY  05/30/2020   Dr.Mansouraty    SOCIAL HISTORY: Social History   Socioeconomic History   Marital status: Married    Spouse name: Not on file   Number of children: 2   Years of education: Not on file   Highest education level: Not on file  Occupational History   Occupation: Event organiser: [employer]  Tobacco Use   Smoking status: Every Day    Packs/day: 0.50    Years: 50.00    Additional pack years: 0.00    Total pack years: 25.00    Types: Cigarettes   Smokeless tobacco: Never  Vaping Use   Vaping Use: Never used  Substance and Sexual Activity   Alcohol use: Yes    Comment: occasional   Drug use: No   Sexual activity: Not on file  Other Topics Concern   Not on file  Social History Narrative   Not on file   Social Determinants of Health   Financial Resource Strain: Not on file  Food Insecurity: Not on file  Transportation Needs: Not on file  Physical Activity: Not on file  Stress: Not on file  Social Connections: Not on file  Intimate Partner Violence: Not on file    FAMILY HISTORY: Family History  Problem Relation Age of Onset  Cancer Mother        Bladder - cause of death   Esophageal cancer Father    Cancer Father        Throat    Liver cancer Brother    Cancer Brother        Liver   Pancreatic cancer Maternal Uncle    Stomach cancer Maternal Uncle    Pancreatic cancer Paternal Uncle    Colon cancer Neg Hx    Inflammatory bowel disease Neg Hx    Rectal cancer Neg Hx     ALLERGIES:  has No Known Allergies.  MEDICATIONS:  Current Outpatient Medications  Medication Sig Dispense Refill   Ascorbic Acid (VITAMIN C PO) Take by mouth.     Cholecalciferol (VITAMIN D3 PO) Take by mouth.     Cyanocobalamin (VITAMIN B-12 PO) Take by mouth.      omeprazole (PRILOSEC) 40 MG capsule Take 1 capsule (40 mg total) by mouth daily. 30 capsule 6   Pyridoxine HCl (VITAMIN B-6 PO) Take by mouth.     No current facility-administered medications for this visit.    REVIEW OF SYSTEMS:   .10 Point review of Systems was done is negative except as noted above.  PHYSICAL EXAMINATION:  Vitals:   08/31/22 1310  BP: 138/82  Pulse: 74  Resp: 16  Temp: (!) 97.3 F (36.3 C)  SpO2: 99%   Filed Weights   08/31/22 1310  Weight: 197 lb 9.6 oz (89.6 kg)   .Body mass index is 26.8 kg/m.  NAD GENERAL:alert, in no acute distress and comfortable SKIN: no acute rashes, no significant lesions EYES: conjunctiva are pink and non-injected, sclera anicteric NECK: supple, no JVD LYMPH:  no palpable lymphadenopathy in the cervical, axillary or inguinal regions LUNGS: clear to auscultation b/l with normal respiratory effort HEART: regular rate & rhythm ABDOMEN:  normoactive bowel sounds , non tender, not distended. Extremity: no pedal edema PSYCH: alert & oriented x 3 with fluent speech NEURO: no focal motor/sensory deficits  LABORATORY DATA:  I have reviewed the data as listed  .    Latest Ref Rng & Units 08/31/2022   12:59 PM 04/29/2022    1:09 PM 10/29/2021    2:29 PM  CBC  WBC 4.0 - 10.5 K/uL 7.2  7.3  7.0   Hemoglobin 13.0 - 17.0 g/dL 16.1  09.6  04.5   Hematocrit 39.0 - 52.0 % 51.8  54.1  51.9   Platelets 150 - 400 K/uL 177  181  159     .    Latest Ref Rng & Units 08/31/2022   12:59 PM 04/29/2022    1:09 PM 10/29/2021    2:29 PM  CMP  Glucose 70 - 99 mg/dL 77  98  79   BUN 8 - 23 mg/dL Creatinine 0.61 - 1.24 mg/dL 4.09  8.11  9.14   Sodium 135 - 145 mmol/L 138  138  141   Potassium 3.5 - 5.1 mmol/L 3.9  4.3  4.1   Chloride 98 - 111 mmol/L 108  107  110   CO2 22 - 32 mmol/L Calcium 8.9 - 10.3 mg/dL 9.3  9.4  9.0   Total Protein 6.5 - 8.1 g/dL 7.3  7.0  7.1   Total Bilirubin 0.3 - 1.2 mg/dL 0.6  0.5   0.5   Alkaline Phos 38 - 126 U/L 123  134  113  AST 15 - 41 U/L ALT 0 - 44 U/L . Lab Results  Component Value Date   LDH 172 08/31/2022    06/20/2019 Kidney Biopsy:    12/31/16 Flow Cytometry:    12/15/16 CLL FISH:    RADIOGRAPHIC STUDIES: I have personally reviewed the radiological images as listed and agreed with the findings in the report. No results found.  ASSESSMENT & PLAN:  72 y.o. male with  1. Chronic Lymphocytic Leukemia - monoalleilic 13q deletion. Was started on Ibrutinib from 01/2017 due to fatigue, weight loss and significant splenomegaly. S/p Rituxan x 4 in 2021. Currently on active surveillance and off ibrutinib after receiving Rituxan.  2. H/o Grade 1 intermittent rash ?related to Ibrutinib - no currently an issue.  Off ibrutinib  3.  Chronic kidney disease likely related to hypertensive arteriolosclerosis. And possible obstructive uropathy. -Patient had a presyncopal episode at work and stopped taking all his medications.  He notes he is feeling well without taking his medications and so he did not restart these. -Patient was recommended to follow-up with his primary care physician for management of his antihypertensives and other medications. -He was recommended to maintain fluid intake with water intake of at least 2 L a day.  3. Secondary polycythemia due to smoking and possible COPD Jak2 mutations neg making Polycythemia vera <1% likely. Plan -strongly counseled on smoking cessation. 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 4. Patient Active Problem List   Diagnosis Date Noted   Mixed hyperlipidemia 02/14/2021   Exertional dyspnea    Syncope and collapse 02/03/2021   Hx of adenomatous colonic polyps 04/09/2020   Abdominal distension 04/09/2020   Bloating 04/09/2020   History of duodenitis 04/09/2020   Counseling regarding advance care  planning and goals of care 01/08/2020   Skin rash 10/03/2017   CLL (chronic lymphocytic leukemia) 12/09/2016   General weakness 12/07/2016   Smoker 12/07/2016   Loss of weight 12/07/2016   GERD (gastroesophageal reflux disease) 12/30/2010   Chronic constipation 12/30/2010   Hemorrhoids, external 12/30/2010   PLAN:   -Discussed lab results from today, 08/31/2022, with the patient. CBC shows elevated hemoglobin of 18.0, but well overall.  CMP shows stable CKD creatinine of 1.46 -educated the patient on smoking cessation.  -Discussed that smoking cigarettes is the possible will cause elevated hemoglobin causing secondary polycythemia and coughing. -No lab findings suggestive of CLL progression at this time. -No indication for additional treatment of the patient's CLL at this time. -Continue following up with PCP.   FOLLOW-UP: RTC with Dr Candise Che with labs in 6 months  The total time spent in the appointment was 21 minutes* .  All of the patient's questions were answered with apparent satisfaction. The patient knows to call the clinic with any problems, questions or concerns.   Wyvonnia Lora MD MS AAHIVMS [facility] Hematology/Oncology Physician [facility]  .*Total Encounter Time as defined by the Centers for Medicare and Medicaid Services includes, in addition to the face-to-face time of a patient visit (documented in the note above) non-face-to-face time: obtaining and reviewing outside history, ordering and reviewing medications, tests or procedures, care coordination (communications with other health care professionals or caregivers) and documentation in the medical record.   I, Ok Edwards, am acting as a Neurosurgeon for Wyvonnia Lora, MD.  .I have reviewed the above documentation for accuracy  and completeness, and I agree with the above. Brunetta Genera MD

## 2022-09-01 ENCOUNTER — Telehealth: Payer: Self-pay | Admitting: Hematology

## 2023-02-27 ENCOUNTER — Other Ambulatory Visit: Payer: Self-pay

## 2023-02-27 DIAGNOSIS — C9110 Chronic lymphocytic leukemia of B-cell type not having achieved remission: Secondary | ICD-10-CM

## 2023-03-01 ENCOUNTER — Inpatient Hospital Stay (HOSPITAL_BASED_OUTPATIENT_CLINIC_OR_DEPARTMENT_OTHER): Payer: PPO | Admitting: Hematology

## 2023-03-01 ENCOUNTER — Other Ambulatory Visit: Payer: Self-pay

## 2023-03-01 ENCOUNTER — Inpatient Hospital Stay: Payer: PPO | Attending: Hematology

## 2023-03-01 VITALS — BP 140/95 | HR 73 | Temp 98.2°F | Resp 17 | Wt 190.6 lb

## 2023-03-01 DIAGNOSIS — I129 Hypertensive chronic kidney disease with stage 1 through stage 4 chronic kidney disease, or unspecified chronic kidney disease: Secondary | ICD-10-CM | POA: Diagnosis not present

## 2023-03-01 DIAGNOSIS — N189 Chronic kidney disease, unspecified: Secondary | ICD-10-CM | POA: Insufficient documentation

## 2023-03-01 DIAGNOSIS — C9110 Chronic lymphocytic leukemia of B-cell type not having achieved remission: Secondary | ICD-10-CM | POA: Insufficient documentation

## 2023-03-01 DIAGNOSIS — F17210 Nicotine dependence, cigarettes, uncomplicated: Secondary | ICD-10-CM | POA: Diagnosis not present

## 2023-03-01 DIAGNOSIS — D751 Secondary polycythemia: Secondary | ICD-10-CM | POA: Diagnosis not present

## 2023-03-01 LAB — CBC WITH DIFFERENTIAL (CANCER CENTER ONLY)
Abs Immature Granulocytes: 0.01 10*3/uL (ref 0.00–0.07)
Basophils Absolute: 0.1 10*3/uL (ref 0.0–0.1)
Basophils Relative: 1 %
Eosinophils Absolute: 0.2 10*3/uL (ref 0.0–0.5)
Eosinophils Relative: 2 %
HCT: 49.5 % (ref 39.0–52.0)
Hemoglobin: 16.9 g/dL (ref 13.0–17.0)
Immature Granulocytes: 0 %
Lymphocytes Relative: 36 %
Lymphs Abs: 2.2 10*3/uL (ref 0.7–4.0)
MCH: 30.4 pg (ref 26.0–34.0)
MCHC: 34.1 g/dL (ref 30.0–36.0)
MCV: 89 fL (ref 80.0–100.0)
Monocytes Absolute: 0.5 10*3/uL (ref 0.1–1.0)
Monocytes Relative: 8 %
Neutro Abs: 3.3 10*3/uL (ref 1.7–7.7)
Neutrophils Relative %: 53 %
Platelet Count: 150 10*3/uL (ref 150–400)
RBC: 5.56 MIL/uL (ref 4.22–5.81)
RDW: 13.4 % (ref 11.5–15.5)
WBC Count: 6.2 10*3/uL (ref 4.0–10.5)
nRBC: 0 % (ref 0.0–0.2)

## 2023-03-01 LAB — LACTATE DEHYDROGENASE: LDH: 122 U/L (ref 98–192)

## 2023-03-01 LAB — CMP (CANCER CENTER ONLY)
ALT: 11 U/L (ref 0–44)
AST: 13 U/L — ABNORMAL LOW (ref 15–41)
Albumin: 4 g/dL (ref 3.5–5.0)
Alkaline Phosphatase: 125 U/L (ref 38–126)
Anion gap: 5 (ref 5–15)
BUN: 17 mg/dL (ref 8–23)
CO2: 27 mmol/L (ref 22–32)
Calcium: 9.3 mg/dL (ref 8.9–10.3)
Chloride: 107 mmol/L (ref 98–111)
Creatinine: 1.52 mg/dL — ABNORMAL HIGH (ref 0.61–1.24)
GFR, Estimated: 49 mL/min — ABNORMAL LOW (ref >60)
Glucose, Bld: 93 mg/dL (ref 70–99)
Potassium: 4.7 mmol/L (ref 3.5–5.1)
Sodium: 139 mmol/L (ref 135–145)
Total Bilirubin: 0.6 mg/dL (ref 0.3–1.2)
Total Protein: 6.9 g/dL (ref 6.5–8.1)

## 2023-03-01 NOTE — Progress Notes (Signed)
HEMATOLOGY/ONCOLOGY CLINIC NOTE  Date of Service: 03/01/2023   Patient Care Team: [name], [place] as PCP - General (Family Medicine) [name], MD as Consulting Physician (Hematology)  CHIEF COMPLAINTS/PURPOSE OF CONSULTATION:  Follow-up for continued management of chronic lymphocytic leukemia.  Oncologic History:  Wesley Bright was initially diagnosed with CLL after a CLL FISH study revealed heterozygous 13q deletion within 92% cells. Subsequent imaging showed diffuse non-bulky lymphadenopathy in chest, abdomen, and pelvis with splenomegaly of 1200 cm^3. The pt began daily Ibrutinib 420mg  on 01/27/17 after becoming increasing fatigued and lost 15 pounds of weight over 4-5 months.  He has now been off ibrutinib for more than 6 months.  He was treated with Rituxan weekly x4 to try to get a deeper response and achieved CR prior to discontinuation of his ibrutinib.  HISTORY OF PRESENTING ILLNESS:  Please see previous note for details on initial presentation  Interval History:   Wesley Bright is here for follow-up of his CLL.   Patient was last seen by me on 08/31/2022 and he complained of dry cough and mild SOB.  Patient is accompanied by his wife during this visit. Patient notes he has been doing well overall without any new or severe medical concerns since our last visit.   He denies any new infection issues, fever, chills, night sweats, new bumps/lumps, abdominal pain, chest pain, back pain, abnormal bowel movement, or leg swelling.  Patient's blood pressure during this visit is 140/95.   Patient notes he still smokes cigarettes. He is currently smoking around 3/4 pack a day.    Patient notes that he tested positive for COVID-19 around 2 months ago and was treated with Paxlovid. He notes that his symptoms were not that bad.   MEDICAL HISTORY:  Past Medical History:  Diagnosis Date   External hemorrhoid    Hypertension    Leukemia (HCC) 2018     SURGICAL HISTORY: Past Surgical History:  Procedure Laterality Date   APPENDECTOMY     COLONOSCOPY  05/30/2020   Dr.Mansouraty   INGUINAL HERNIA REPAIR     left   KNEE SURGERY     Left Knee   UPPER GASTROINTESTINAL ENDOSCOPY  05/30/2020   Dr.Mansouraty    SOCIAL HISTORY: Social History   Socioeconomic History   Marital status: Married    Spouse name: Not on file   Number of children: 2   Years of education: Not on file   Highest education level: Not on file  Occupational History   Occupation: Event organiser: [employer]  Tobacco Use   Smoking status: Every Day    Current packs/day: 0.50    Average packs/day: 0.5 packs/day for 50.0 years (25.0 ttl pk-yrs)    Types: Cigarettes   Smokeless tobacco: Never  Vaping Use   Vaping status: Never Used  Substance and Sexual Activity   Alcohol use: Yes    Comment: occasional   Drug use: No   Sexual activity: Not on file  Other Topics Concern   Not on file  Social History Narrative   Not on file   Social Determinants of Health   Financial Resource Strain: Not on file  Food Insecurity: Not on file  Transportation Needs: Not on file  Physical Activity: Not on file  Stress: Not on file  Social Connections: Not on file  Intimate Partner Violence: Not on file    FAMILY HISTORY: Family History  Problem Relation Age of Onset  Cancer Mother        Bladder - cause of death   Esophageal cancer Father    Cancer Father        Throat    Liver cancer Brother    Cancer Brother        Liver   Pancreatic cancer Maternal Uncle    Stomach cancer Maternal Uncle    Pancreatic cancer Paternal Uncle    Colon cancer Neg Hx    Inflammatory bowel disease Neg Hx    Rectal cancer Neg Hx     ALLERGIES:  has No Known Allergies.  MEDICATIONS:  Current Outpatient Medications  Medication Sig Dispense Refill   Ascorbic Acid (VITAMIN C PO) Take by mouth.     Cholecalciferol (VITAMIN D3 PO) Take by mouth.      Cyanocobalamin (VITAMIN B-12 PO) Take by mouth.     omeprazole (PRILOSEC) 40 MG capsule Take 1 capsule (40 mg total) by mouth daily. 30 capsule 6   Pyridoxine HCl (VITAMIN B-6 PO) Take by mouth.     No current facility-administered medications for this visit.    REVIEW OF SYSTEMS:   .10 Point review of Systems was done is negative except as noted above.  PHYSICAL EXAMINATION:  Vitals:   03/01/23 1330  BP: (!) 140/95  Pulse: 73  Resp: 17  Temp: 98.2 F (36.8 C)  SpO2: 100%    Filed Weights   03/01/23 1330  Weight: 190 lb 9.6 oz (86.5 kg)    .Body mass index is 25.85 kg/m.  NAD GENERAL:alert, in no acute distress and comfortable SKIN: no acute rashes, no significant lesions EYES: conjunctiva are pink and non-injected, sclera anicteric NECK: supple, no JVD LYMPH:  no palpable lymphadenopathy in the cervical, axillary or inguinal regions LUNGS: clear to auscultation b/l with normal respiratory effort HEART: regular rate & rhythm ABDOMEN:  normoactive bowel sounds , non tender, not distended. Extremity: no pedal edema PSYCH: alert & oriented x 3 with fluent speech NEURO: no focal motor/sensory deficits  LABORATORY DATA:  I have reviewed the data as listed  .    Latest Ref Rng & Units 03/01/2023    1:19 PM 08/31/2022   12:59 PM 04/29/2022    1:09 PM  CBC  WBC 4.0 - 10.5 K/uL 6.2  7.2  7.3   Hemoglobin 13.0 - 17.0 g/dL 33.2  95.1  88.4   Hematocrit 39.0 - 52.0 % 49.5  51.8  54.1   Platelets 150 - 400 K/uL 150  177  181     .    Latest Ref Rng & Units 03/01/2023    1:19 PM 08/31/2022   12:59 PM 04/29/2022    1:09 PM  CMP  Glucose 70 - 99 mg/dL 93  77  98   BUN 8 - 23 mg/dL 17  16  15    Creatinine 0.61 - 1.24 mg/dL 1.66  0.63  0.16   Sodium 135 - 145 mmol/L 139  138  138   Potassium 3.5 - 5.1 mmol/L 4.7  3.9  4.3   Chloride 98 - 111 mmol/L 107  108  107   CO2 22 - 32 mmol/L 27  23  23    Calcium 8.9 - 10.3 mg/dL 9.3  9.3  9.4   Total Protein 6.5 - 8.1 g/dL  6.9  7.3  7.0   Total Bilirubin 0.3 - 1.2 mg/dL 0.6  0.6  0.5   Alkaline Phos 38 - 126 U/L 125  123  134  AST 15 - 41 U/L 13  11  13    ALT 0 - 44 U/L 11  10  9     . Lab Results  Component Value Date   LDH 122 03/01/2023    06/20/2019 Kidney Biopsy:    12/31/16 Flow Cytometry:    12/15/16 CLL FISH:    RADIOGRAPHIC STUDIES: I have personally reviewed the radiological images as listed and agreed with the findings in the report. No results found.  ASSESSMENT & PLAN:  72 y.o. male with  1. Chronic Lymphocytic Leukemia - monoalleilic 13q deletion. Was started on Ibrutinib from 01/2017 due to fatigue, weight loss and significant splenomegaly. S/p Rituxan x 4 in 2021. Currently on active surveillance and off ibrutinib after receiving Rituxan.  2. H/o Grade 1 intermittent rash ?related to Ibrutinib - no currently an issue.  Off ibrutinib  3.  Chronic kidney disease likely related to hypertensive arteriolosclerosis. And possible obstructive uropathy. -Patient had a presyncopal episode at work and stopped taking all his medications.  He notes he is feeling well without taking his medications and so he did not restart these. -Patient was recommended to follow-up with his primary care physician for management of his antihypertensives and other medications. -He was recommended to maintain fluid intake with water intake of at least 2 L a day.  3. Secondary polycythemia due to smoking and possible COPD Jak2 mutations neg making Polycythemia vera <1% likely. Plan -strongly counseled on smoking cessation. 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 4. Patient Active Problem List   Diagnosis Date Noted   Mixed hyperlipidemia 02/14/2021   Exertional dyspnea    Syncope and collapse 02/03/2021   Hx of adenomatous colonic polyps 04/09/2020   Abdominal distension 04/09/2020   Bloating 04/09/2020   History of  duodenitis 04/09/2020   Counseling regarding advance care planning and goals of care 01/08/2020   Skin rash 10/03/2017   CLL (chronic lymphocytic leukemia) (HCC) 12/09/2016   General weakness 12/07/2016   Smoker 12/07/2016   Loss of weight 12/07/2016   GERD (gastroesophageal reflux disease) 12/30/2010   Chronic constipation 12/30/2010   Hemorrhoids, external 12/30/2010   PLAN:   -Discussed lab results from today, 03/01/2023, in detail with the patient. CBC is stable. CMP is stable with chronic kidney disease with elevated creatinine of 1.52.   -educated the patient on smoking cessation.  -Discussed that smoking cigarettes is the possible will cause elevated hemoglobin causing secondary polycythemia and coughing. -No lab findings suggestive of CLL progression at this time. -No indication for additional treatment of the patient's CLL at this time. -Continue following up with PCP.  -Recommend influenza vaccine, COVID-19 Booster, RSV vaccine, and other age related vaccines.  -Answered all of patient's questions.  -Recommend to start Vitamin-D supplement. 2,000 unit.  -Discussed the option of nicotine patches. Patient denies.  -Recommend to follow-up with PCP.   FOLLOW-UP: RTC with Dr Candise Che with labs in 6 months  The total time spent in the appointment was 23 minutes* .  All of the patient's questions were answered with apparent satisfaction. The patient knows to call the clinic with any problems, questions or concerns.   Wyvonnia Lora MD MS AAHIVMS [facility] Hematology/Oncology Physician [facility]  .*Total Encounter Time as defined by the Centers for Medicare and Medicaid Services includes, in addition to the face-to-face time of a patient visit (documented in the note above) non-face-to-face time: obtaining and reviewing outside history, ordering  and reviewing medications, tests or procedures, care coordination (communications with other health care professionals or  caregivers) and documentation in the medical record.   I,Param Shah,acting as a Neurosurgeon for Wyvonnia Lora, MD.,have documented all relevant documentation on the behalf of Wyvonnia Lora, MD,as directed by  Wyvonnia Lora, MD while in the presence of Wyvonnia Lora, MD.  .I have reviewed the above documentation for accuracy and completeness, and I agree with the above. [name] MD

## 2023-03-05 ENCOUNTER — Telehealth: Payer: Self-pay | Admitting: Hematology

## 2023-03-05 NOTE — Telephone Encounter (Signed)
Per Candise Che 10/14 los unable to leave message regarding follow up visit times/dates due to voicemail box being full

## 2023-04-07 NOTE — Telephone Encounter (Signed)
Telephone call  

## 2023-08-26 ENCOUNTER — Other Ambulatory Visit: Payer: Self-pay

## 2023-08-26 DIAGNOSIS — C9110 Chronic lymphocytic leukemia of B-cell type not having achieved remission: Secondary | ICD-10-CM

## 2023-08-26 NOTE — Progress Notes (Signed)
 HEMATOLOGY/ONCOLOGY CLINIC NOTE  Date of Service: 08/27/2023   Patient Care Team: Wesley Bright  E, PA-C as PCP - General (Family Medicine) Wesley Israel, MD as Consulting Physician (Hematology)  CHIEF COMPLAINTS/PURPOSE OF CONSULTATION:  Follow-up for continued management of chronic lymphocytic leukemia.  Oncologic History:  [name] with CLL after a CLL FISH study revealed heterozygous 13q deletion within 92% cells. Subsequent imaging showed diffuse non-bulky lymphadenopathy in chest, abdomen, and pelvis with splenomegaly of 1200 cm^3. The pt began daily Ibrutinib 420mg  on 01/27/17 after becoming increasing fatigued and lost 15 pounds of weight over 4-5 months.  He has now been off ibrutinib for more than 6 months.  He was treated with Rituxan weekly x4 to try to get a deeper response and achieved CR prior to discontinuation of his ibrutinib.  HISTORY OF PRESENTING ILLNESS:  Please see previous note for details on initial presentation  Interval History:   Wesley Bright is a 73 y.o. male here for follow-up of his CLL. Patient was last seen by me on 03/01/2023 and complained of dry cough. He reported improvement in his SOB.  Today, he is accompanied by his wife. Patient reports that he has been doing well overall over the last six months with no new concerns. He reports no new lumps/bumps, fever, chills, night sweats, significant breathing issues  His blood pressure in clinic today is 134/84.   He reports abdominal pain sometimes, though he denies any abdominal pain at this time.   Patient's weight has generally been stable over the last 6 months.   He denies any leg swelling. Patient sometimes endorses numbness in his legs. He reports that he has more restless legs when sleeping, though his symptoms are not significantly bothersome.   He reports that he is currently smoking half a pack of cigarettes daily.   Patient reports urinary  hesitancy He reports clear urine. Patient is not following with his nephrologist.   His PCP is Wesley Bright  Wesley Bright, [place]. He was last seen by his PCP 6 months ago.  MEDICAL HISTORY:  Past Medical History:  Diagnosis Date   External hemorrhoid    Hypertension    Leukemia (HCC) 2018    SURGICAL HISTORY: Past Surgical History:  Procedure Laterality Date   APPENDECTOMY     COLONOSCOPY  05/30/2020   Dr.Mansouraty   INGUINAL HERNIA REPAIR     left   KNEE SURGERY     Left Knee   UPPER GASTROINTESTINAL ENDOSCOPY  05/30/2020   Dr.Mansouraty    SOCIAL HISTORY: Social History   Socioeconomic History   Marital status: Married    Spouse name: Not on file   Number of children: 2   Years of education: Not on file   Highest education level: Not on file  Occupational History   Occupation: Event organiser: [employer]  Tobacco Use   Smoking status: Every Day    Current packs/day: 0.50    Average packs/day: 0.5 packs/day for 50.0 years (25.0 ttl pk-yrs)    Types: Cigarettes   Smokeless tobacco: Never  Vaping Use   Vaping status: Never Used  Substance and Sexual Activity   Alcohol use: Yes    Comment: occasional   Drug use: No   Sexual activity: Not on file  Other Topics Concern   Not on file  Social History Narrative   Not on file   Social Drivers of Health   Financial Resource Strain: Not on file  Food Insecurity: Not on file  Transportation Needs: Not on file  Physical Activity: Not on file  Stress: Not on file  Social Connections: Not on file  Intimate Partner Violence: Not on file    FAMILY HISTORY: Family History  Problem Relation Age of Onset   Cancer Mother        Bladder - cause of death   Esophageal cancer Father    Cancer Father        Throat    Liver cancer Brother    Cancer Brother        Liver   Pancreatic cancer Maternal Uncle    Stomach cancer Maternal Uncle    Pancreatic cancer Paternal Uncle    Colon cancer Neg  Hx    Inflammatory bowel disease Neg Hx    Rectal cancer Neg Hx     ALLERGIES:  has no known allergies.  MEDICATIONS:  Current Outpatient Medications  Medication Sig Dispense Refill   Ascorbic Acid (VITAMIN C PO) Take by mouth.     Cholecalciferol (VITAMIN D3 PO) Take by mouth.     Cyanocobalamin (VITAMIN B-12 PO) Take by mouth.     omeprazole (PRILOSEC) 40 MG capsule Take 1 capsule (40 mg total) by mouth daily. 30 capsule 6   Pyridoxine HCl (VITAMIN B-6 PO) Take by mouth.     No current facility-administered medications for this visit.    REVIEW OF SYSTEMS:    10 Point review of Systems was done is negative except as noted above.   PHYSICAL EXAMINATION:  Vitals:   08/27/23 1023  BP: 134/84  Pulse: 74  Resp: 16  Temp: 97.6 F (36.4 C)  SpO2: 100%    Filed Weights   08/27/23 1023  Weight: 189 lb 6.4 oz (85.9 kg)    .Body mass index is 25.69 kg/m.    GENERAL:alert, in no acute distress and comfortable SKIN: no acute rashes, no significant lesions EYES: conjunctiva are pink and non-injected, sclera anicteric OROPHARYNX: MMM, no exudates, no oropharyngeal erythema or ulceration NECK: supple, no JVD LYMPH:  no palpable lymphadenopathy in the cervical, axillary or inguinal regions LUNGS: clear to auscultation b/l with normal respiratory effort HEART: regular rate & rhythm ABDOMEN:  normoactive bowel sounds , non tender, not distended. Extremity: no pedal edema PSYCH: alert & oriented x 3 with fluent speech NEURO: no focal motor/sensory deficits   LABORATORY DATA:  I have reviewed the data as listed  .    Latest Ref Rng & Units 08/27/2023    9:59 AM 03/01/2023    1:19 PM 08/31/2022   12:59 PM  CBC  WBC 4.0 - 10.5 K/uL 6.9  6.2  7.2   Hemoglobin 13.0 - 17.0 g/dL 16.1  09.6  04.5   Hematocrit 39.0 - 52.0 % 55.2  49.5  51.8   Platelets 150 - 400 K/uL 153  150  177     .    Latest Ref Rng & Units 08/27/2023    9:59 AM 03/01/2023    1:19 PM 08/31/2022    12:59 PM  CMP  Glucose 70 - 99 mg/dL 76  93  77   BUN 8 - 23 mg/dL 19  17  16    Creatinine 0.61 - 1.24 mg/dL 4.09  8.11  9.14   Sodium 135 - 145 mmol/L 140  139  138   Potassium 3.5 - 5.1 mmol/L 4.2  4.7  3.9   Chloride 98 - 111 mmol/L 107  107  108  CO2 22 - 32 mmol/L 27  27  23    Calcium 8.9 - 10.3 mg/dL 9.5  9.3  9.3   Total Protein 6.5 - 8.1 g/dL 7.6  6.9  7.3   Total Bilirubin 0.0 - 1.2 mg/dL 0.6  0.6  0.6   Alkaline Phos 38 - 126 U/L 128  125  123   AST 15 - 41 U/L 13  13  11    ALT 0 - 44 U/L 11  11  10     . Lab Results  Component Value Date   LDH 122 03/01/2023    06/20/2019 Kidney Biopsy:    12/31/16 Flow Cytometry:    12/15/16 CLL FISH:    RADIOGRAPHIC STUDIES: I have personally reviewed the radiological images as listed and agreed with the findings in the report. No results found.  ASSESSMENT & PLAN:   73 y.o. male with  1. Chronic Lymphocytic Leukemia - monoalleilic 13q deletion. Was started on Ibrutinib from 01/2017 due to fatigue, weight loss and significant splenomegaly. S/p Rituxan x 4 in 2021. Currently on active surveillance and off ibrutinib after receiving Rituxan.  2. H/o Grade 1 intermittent rash ?related to Ibrutinib - no currently an issue.  Off ibrutinib  3.  Chronic kidney disease likely related to hypertensive arteriolosclerosis. And possible obstructive uropathy. -Patient had a presyncopal episode at work and stopped taking all his medications.  He notes he is feeling well without taking his medications and so he did not restart these. -Patient was recommended to follow-up with his primary care physician for management of his antihypertensives and other medications. -He was recommended to maintain fluid intake with water intake of at least 2 L a day.  3. Secondary polycythemia due to smoking and possible COPD Jak2 mutations neg making Polycythemia vera <1% likely. Plan -strongly counseled on smoking cessation. 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 4. Patient Active Problem List   Diagnosis Date Noted   Mixed hyperlipidemia 02/14/2021   Exertional dyspnea    Syncope and collapse 02/03/2021   Hx of adenomatous colonic polyps 04/09/2020   Abdominal distension 04/09/2020   Bloating 04/09/2020   History of duodenitis 04/09/2020   Counseling regarding advance care planning and goals of care 01/08/2020   Skin rash 10/03/2017   CLL (chronic lymphocytic leukemia) (HCC) 12/09/2016   General weakness 12/07/2016   Smoker 12/07/2016   Loss of weight 12/07/2016   GERD (gastroesophageal reflux disease) 12/30/2010   Chronic constipation 12/30/2010   Hemorrhoids, external 12/30/2010   PLAN:    -Discussed lab results on 08/27/2023 in detail with patient. CBC showed WBC of 6.9K, hemoglobin of 18.7, HCT 55.2%, and platelets of 153K. -WBCs remain normal -lymphocytes normal -platelets normal -no clinical sign or lab evidence to suggest CLL progression -No indication for additional treatment of the patient's CLL at this time. -patient is quite polycythemic -RBCs elevated at 6.24 -patient did previously have mutation profiling December 2023, which was JAK2 negative. Results showed Tier III: Variants of Unknown Clinical Significance KDM6A p.Pro527Arg -discussed that given his genetic testing was previously negative, his elevated RBCs are secondary to an external factor -Educated patient that RBCs commonly react to low oxygen levels, caused by smoking, sleep apnea, dehydration, etc -discussed that sleep apnea can cause restless legs as well.  -discussed that his RBCs tend to fluctuate with normal levels in between, which is not consistent with a primary bone marrow disorder given that there is no  progressive increase over time.  -discussed concern that polycythemia can make the blood too thick and cause blood clots such as heart attacks and  strokes -educated patient that if there were concern for a bone marrow problem, HCT would be ideally kept 45%. However, given there is no concern for bone marrow disorder, will aim for HCT 52%. -it is okay to donate blood 3-4x a year -recommend smoking cessation, staying well-hydrated (64Oz of water daily), following with PCP to consider a sleeping study to rule out sleep apnea -recommend PCP consider US  abd to evaluate liver and kidneys and also US  doppler to r/o RAS -discussed concern that patient may have a tight enlarged prostate, which needs to be evaluated so that it does not bother the kidneys -elevated creatinine at 1.71 can occur with enlarged prostate -recommend referral to urologist/nephrologist to rule out enlarged prostate either through us  or PCP  -patient shall contact us  if he is needing us  to place urology referral -patient shall return to clinic in 6 months  FOLLOW-UP: F/u with PCP for urology referral , consideration of sleep apnea testing and US  abd and US  doppler renal RTC with Dr Salomon Cree with labs in 3-4 months  The total time spent in the appointment was 30 minutes* .  All of the patient's questions were answered with apparent satisfaction. The patient knows to call the clinic with any problems, questions or concerns.   Jacquelyn Matt MD MS AAHIVMS [facility] Hematology/Oncology Physician [facility]  .*Total Encounter Time as defined by the Centers for Medicare and Medicaid Services includes, in addition to the face-to-face time of a patient visit (documented in the note above) non-face-to-face time: obtaining and reviewing outside history, ordering and reviewing medications, tests or procedures, care coordination (communications with other health care professionals or caregivers) and documentation in the medical record.    I,Mitra Faeizi,acting as a Neurosurgeon for Jacquelyn Matt, MD.,have documented all relevant documentation on the behalf of Jacquelyn Matt, MD,as  directed by  Jacquelyn Matt, MD while in the presence of Jacquelyn Matt, MD.  .I have reviewed the above documentation for accuracy and completeness, and I agree with the above. .Marion Rosenberry Kishore Elesha Thedford MD

## 2023-08-27 ENCOUNTER — Inpatient Hospital Stay: Attending: Hematology

## 2023-08-27 ENCOUNTER — Other Ambulatory Visit: Payer: Self-pay

## 2023-08-27 ENCOUNTER — Inpatient Hospital Stay (HOSPITAL_BASED_OUTPATIENT_CLINIC_OR_DEPARTMENT_OTHER): Admitting: Hematology

## 2023-08-27 VITALS — BP 134/84 | HR 74 | Temp 97.6°F | Resp 16 | Ht 72.0 in | Wt 189.4 lb

## 2023-08-27 DIAGNOSIS — D751 Secondary polycythemia: Secondary | ICD-10-CM | POA: Insufficient documentation

## 2023-08-27 DIAGNOSIS — C9110 Chronic lymphocytic leukemia of B-cell type not having achieved remission: Secondary | ICD-10-CM

## 2023-08-27 DIAGNOSIS — F17210 Nicotine dependence, cigarettes, uncomplicated: Secondary | ICD-10-CM | POA: Insufficient documentation

## 2023-08-27 DIAGNOSIS — Z79899 Other long term (current) drug therapy: Secondary | ICD-10-CM | POA: Insufficient documentation

## 2023-08-27 DIAGNOSIS — N189 Chronic kidney disease, unspecified: Secondary | ICD-10-CM | POA: Diagnosis not present

## 2023-08-27 LAB — CBC WITH DIFFERENTIAL (CANCER CENTER ONLY)
Abs Immature Granulocytes: 0.01 10*3/uL (ref 0.00–0.07)
Basophils Absolute: 0.1 10*3/uL (ref 0.0–0.1)
Basophils Relative: 1 %
Eosinophils Absolute: 0.2 10*3/uL (ref 0.0–0.5)
Eosinophils Relative: 2 %
HCT: 55.2 % — ABNORMAL HIGH (ref 39.0–52.0)
Hemoglobin: 18.7 g/dL — ABNORMAL HIGH (ref 13.0–17.0)
Immature Granulocytes: 0 %
Lymphocytes Relative: 29 %
Lymphs Abs: 2 10*3/uL (ref 0.7–4.0)
MCH: 30 pg (ref 26.0–34.0)
MCHC: 33.9 g/dL (ref 30.0–36.0)
MCV: 88.5 fL (ref 80.0–100.0)
Monocytes Absolute: 0.6 10*3/uL (ref 0.1–1.0)
Monocytes Relative: 9 %
Neutro Abs: 4.1 10*3/uL (ref 1.7–7.7)
Neutrophils Relative %: 59 %
Platelet Count: 153 10*3/uL (ref 150–400)
RBC: 6.24 MIL/uL — ABNORMAL HIGH (ref 4.22–5.81)
RDW: 13.2 % (ref 11.5–15.5)
WBC Count: 6.9 10*3/uL (ref 4.0–10.5)
nRBC: 0 % (ref 0.0–0.2)

## 2023-08-27 LAB — CMP (CANCER CENTER ONLY)
ALT: 11 U/L (ref 0–44)
AST: 13 U/L — ABNORMAL LOW (ref 15–41)
Albumin: 4.4 g/dL (ref 3.5–5.0)
Alkaline Phosphatase: 128 U/L — ABNORMAL HIGH (ref 38–126)
Anion gap: 6 (ref 5–15)
BUN: 19 mg/dL (ref 8–23)
CO2: 27 mmol/L (ref 22–32)
Calcium: 9.5 mg/dL (ref 8.9–10.3)
Chloride: 107 mmol/L (ref 98–111)
Creatinine: 1.71 mg/dL — ABNORMAL HIGH (ref 0.61–1.24)
GFR, Estimated: 42 mL/min — ABNORMAL LOW (ref >60)
Glucose, Bld: 76 mg/dL (ref 70–99)
Potassium: 4.2 mmol/L (ref 3.5–5.1)
Sodium: 140 mmol/L (ref 135–145)
Total Bilirubin: 0.6 mg/dL (ref 0.0–1.2)
Total Protein: 7.6 g/dL (ref 6.5–8.1)

## 2023-08-27 LAB — LACTATE DEHYDROGENASE: LDH: 127 U/L (ref 98–192)

## 2023-08-30 ENCOUNTER — Ambulatory Visit: Payer: PPO | Admitting: Hematology

## 2023-08-30 ENCOUNTER — Other Ambulatory Visit: Payer: PPO

## 2023-09-05 IMAGING — CT CT HEART MORP W/ CTA COR W/ SCORE W/ CA W/CM &/OR W/O CM
1 series · 12 of 20 positions shown, 15 images · IV contrast (omnipaque)
Comparison: 12/22/2016 diagnostic chest CT. Chest radiograph
02/02/2021.
COMPARISON: 12/22/2016 diagnostic chest CT. Chest radiograph
02/02/2021.

Addendum:
EXAM:
OVER-READ INTERPRETATION  CT CHEST

The following report is an over-read performed by radiologist Dr.
Archontis Koilakos [REDACTED] on 02/07/2021. This over-read
does not include interpretation of cardiac or coronary anatomy or
pathology. The coronary CTA interpretation by the cardiologist is
attached.
HISTORY: Chest pain/anginal equiv, 10yr CHD risk 10-20%, not treadmill
candidate
Cardiac/Coronary  CT
TECHNIQUE: The patient was scanned on a Siemens Force scanner.
PROTOCOL: A 120 kV prospective scan was triggered in the descending thoracic
aorta at 111 HU's. Axial non-contrast 3 mm slices were carried out
through the heart. The data set was analyzed on a dedicated work
station and scored using the Agatson method. Gantry rotation speed
was 250 msecs and collimation was .6 mm. No IV beta blockade but
mg of sl NTG was given. The 3D data set was reconstructed in 5%
intervals of the 67-82 % of the R-R cycle. Diastolic phases were
analyzed on a dedicated work station using MPR, MIP and VRT modes.
The patient received 100mL OMNIPAQUE IOHEXOL 350 MG/ML SOLN of
contrast.

[Series 2449: — · 0.43mm/px · 12 of 22 slices shown, 15 images]
[im 2/22  vessel]
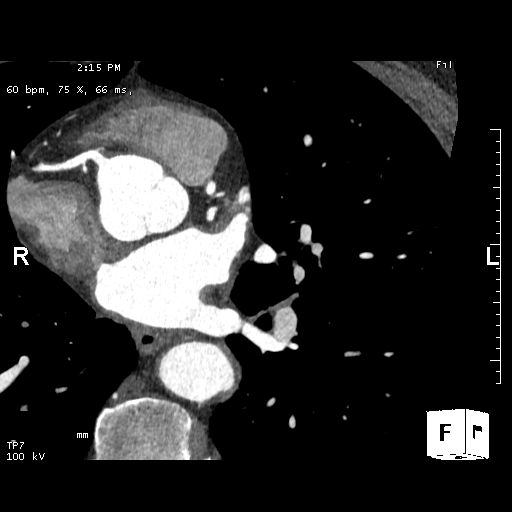
[im 2/22  lung]
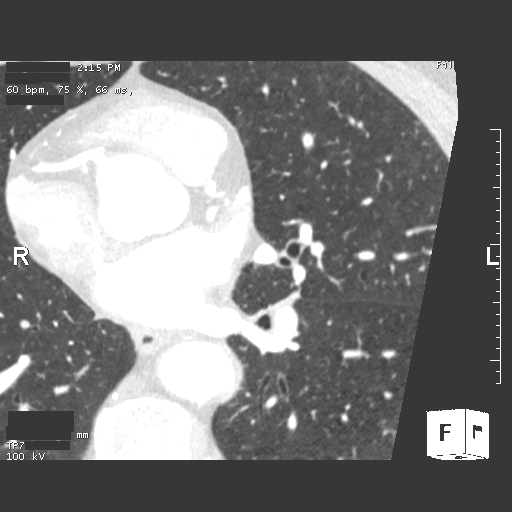
[im 4/22  vessel]
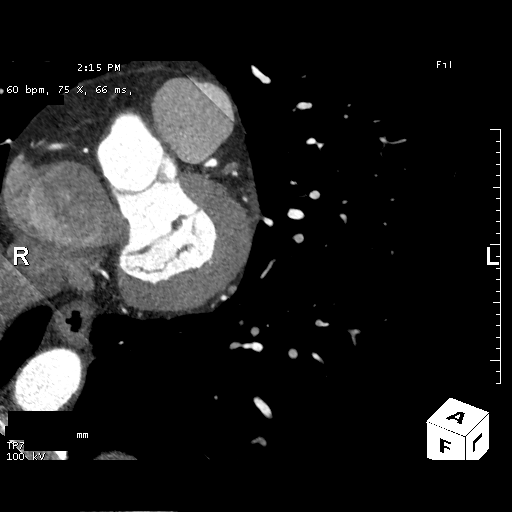
[im 5/22  vessel]
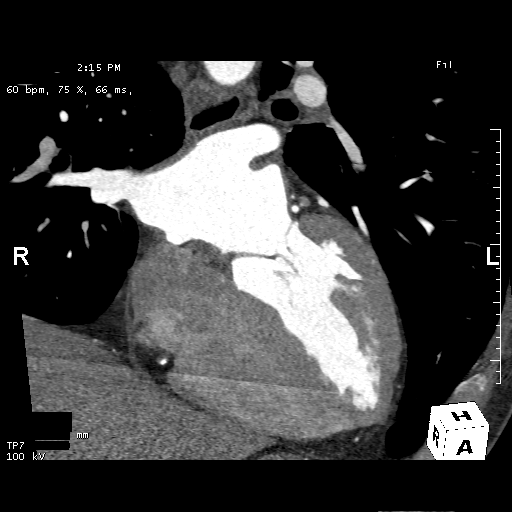
[im 6/22  vessel]
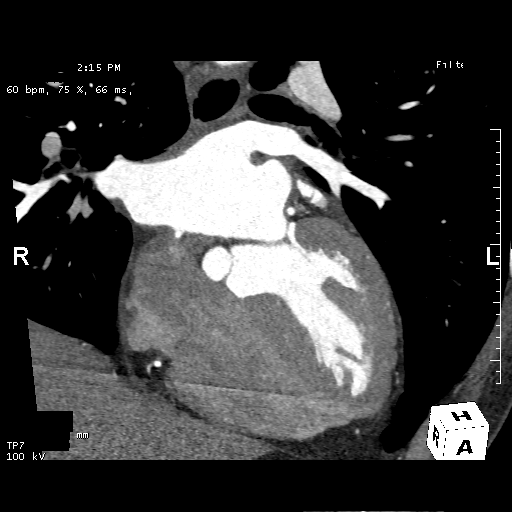
[im 8/22  vessel]
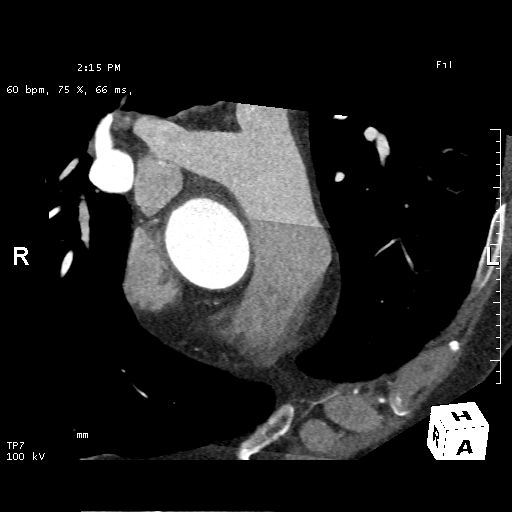
[im 8/22  lung]
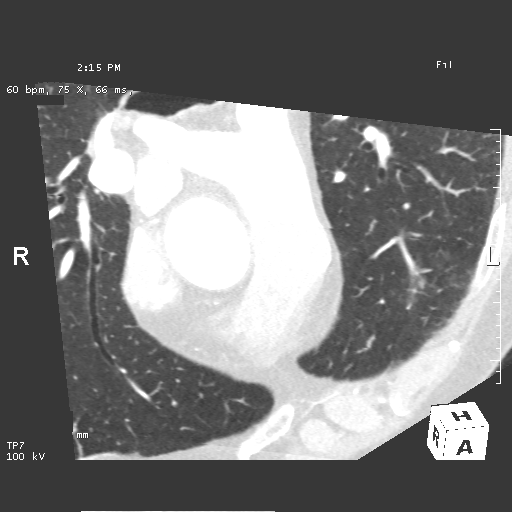
[im 9/22  vessel]
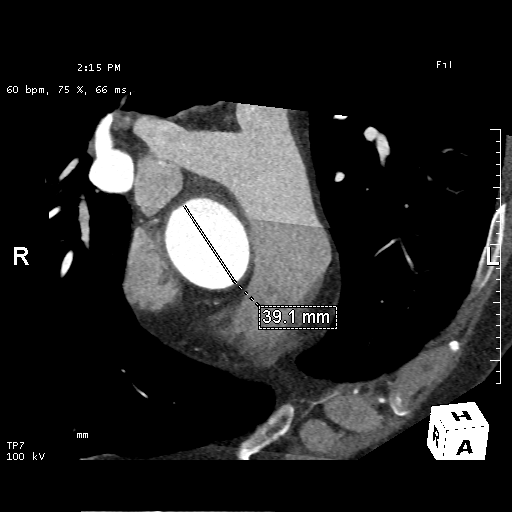
[im 12/22  vessel]
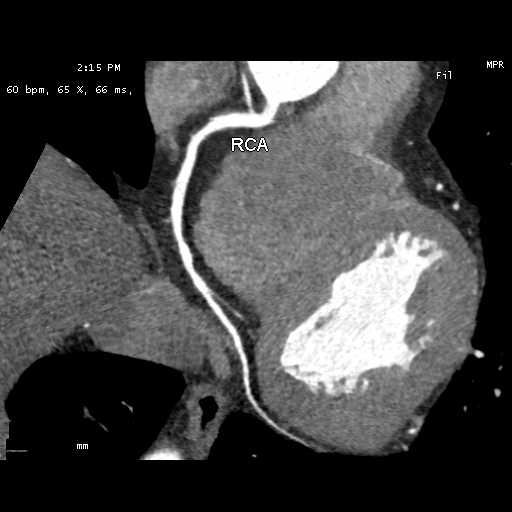
[im 13/22  vessel]
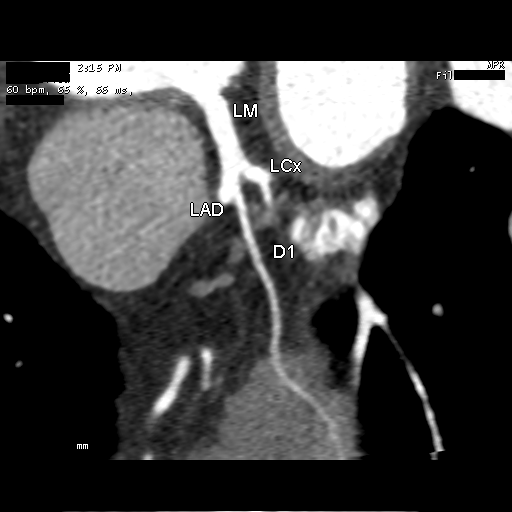
[im 15/22  vessel]
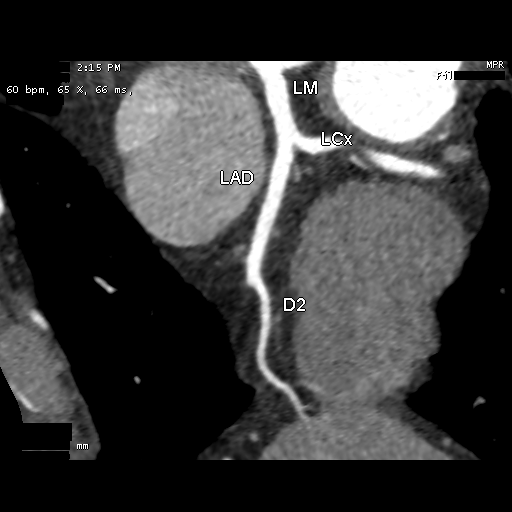
[im 15/22  lung]
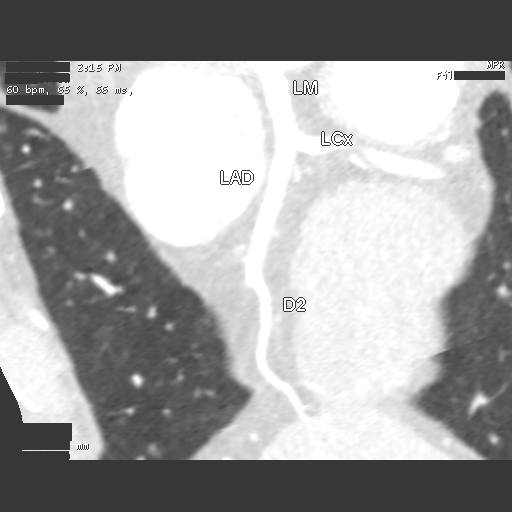
[im 16/22  vessel]
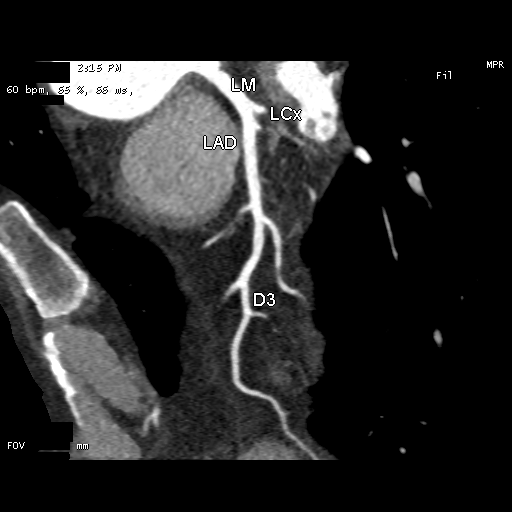
[im 17/22  vessel]
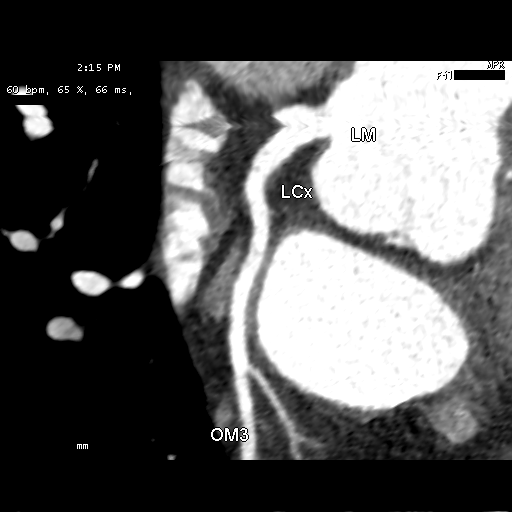
[im 19/22  vessel]
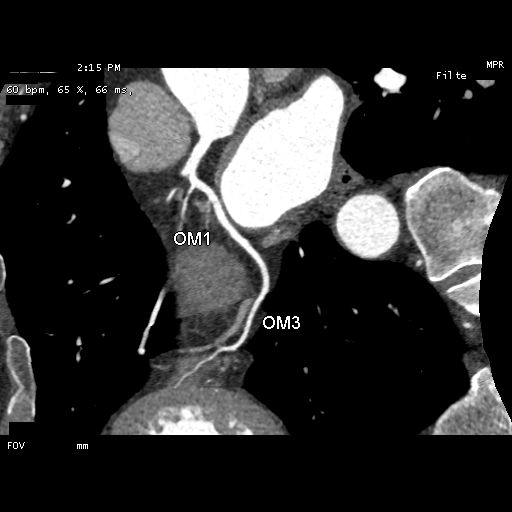

[12 of 20 positions shown; findings below may reference images not displayed]

FINDINGS: Vascular: Aortic atherosclerosis. No central pulmonary embolism, on
this non-dedicated study.

Mediastinum/Nodes: No imaged thoracic adenopathy. Fluid level in the
esophagus on [DATE].

Lungs/Pleura: No pleural fluid.  Clear imaged lungs.

Upper Abdomen: Tiny low-density liver lesions are likely cysts. An
incompletely imaged 8 mm focus of hyperenhancement in the right
hepatic lobe on 83/8 is favored to represent a perfusion anomaly.
Normal imaged portions of the spleen, stomach.

Musculoskeletal: No acute osseous abnormality.
IMPRESSION: 1.  No acute findings in the imaged extracardiac chest.
2.  Aortic Atherosclerosis (ECMH8-0W6.6).
3. Esophageal air fluid level suggests dysmotility or
gastroesophageal reflux.
FINDINGS: Image quality: Average.

Artifact: Limited.

Total coronary calcium score of 0.

Coronary arteries: Normal coronary origins.  Right dominance.

Left Main Coronary Artery: The left main is a normal caliber vessel
with a normal take off from the left coronary cusp that bifurcates
to form a left anterior descending artery and a left circumflex
artery. There is no plaque or stenosis.

Left Anterior Descending Coronary Artery: Normal caliber vessel,
wraps the apex, gives off 3 patent diagonal branches. The LAD is
patent without evidence of plaque or stenosis.

Left Circumflex Artery: Normal caliber vessel, non-dominant, travels
within the atrioventricular groove, gives off 2 patent obtuse
marginal branches. The LCX is patent with no evidence of plaque or
stenosis. OM1 and OM 2 are small caliber vessel but patent. OM3 is
normal caliber vessel and overall patent without plaque or stenosis.

Right Coronary Artery: The RCA is dominant with normal take off from
the right coronary cusp. The RCA terminates as a PDA and right
posterolateral branch. Proximal to mid RCA is patent. Mild stenosis
(25-49%) due to noncalcified plaque at mid to distal RCA.

Left Atrium: Grossly normal in size with no left atrial appendage
filling defect.

Left Ventricle: Grossly normal in size. There are no stigmata of
prior infarction. There is no abnormal filling defect.

Pulmonary arteries: Normal in size without proximal filling defect.

Pulmonary veins: Normal pulmonary venous drainage.

Aorta: Mild dilatation of the mid ascending aorta at 39.5 mm (at
level of the PA bifurcation) measured double oblique. Aortic
atherosclerosis. No dissection.

Pericardium: Normal thickness with no significant effusion or
calcium present.

Cardiac valves: The aortic valve is trileaflet without significant
calcification. The mitral valve is normal structure without
significant calcification.

Extra-cardiac findings: See attached radiology report for
non-cardiac structures.
IMPRESSION: 1. Total coronary calcium score of 0.

2. Normal coronary origin with right dominance.

3. Mild dilatation of the mid ascending aorta at 39.4 mm.

4. Aortic atherosclerosis.

5. CAD-RADS = 2 Mild non-obstructive CAD (25-49%).

LM: Patent

LAD: Patent

LCx: Patent

RCA: Proximal to mid RCA is patent. Mild stenosis (25-49%) due to
noncalcified plaque at mid to distal RCA.

RECOMMENDATIONS:

Consider symptom-guided anti-ischemic pharmacotherapy as well as
risk factor modification per guideline directed care.

Consider other causes of chest pain.

Consider preventive therapy and risk factor modification.

*** End of Addendum ***
EXAM:
OVER-READ INTERPRETATION  CT CHEST

The following report is an over-read performed by radiologist Dr.
Archontis Koilakos [REDACTED] on 02/07/2021. This over-read
does not include interpretation of cardiac or coronary anatomy or
pathology. The coronary CTA interpretation by the cardiologist is
attached.
FINDINGS: Vascular: Aortic atherosclerosis. No central pulmonary embolism, on
this non-dedicated study.

Mediastinum/Nodes: No imaged thoracic adenopathy. Fluid level in the
esophagus on [DATE].

Lungs/Pleura: No pleural fluid.  Clear imaged lungs.

Upper Abdomen: Tiny low-density liver lesions are likely cysts. An
incompletely imaged 8 mm focus of hyperenhancement in the right
hepatic lobe on 83/8 is favored to represent a perfusion anomaly.
Normal imaged portions of the spleen, stomach.

Musculoskeletal: No acute osseous abnormality.
IMPRESSION: 1.  No acute findings in the imaged extracardiac chest.
2.  Aortic Atherosclerosis (ECMH8-0W6.6).
3. Esophageal air fluid level suggests dysmotility or
gastroesophageal reflux.

## 2023-09-17 ENCOUNTER — Telehealth: Payer: Self-pay | Admitting: Hematology

## 2023-09-17 NOTE — Telephone Encounter (Signed)
Called patient received no answer unable to leave vm mailbox full

## 2023-12-16 ENCOUNTER — Other Ambulatory Visit: Payer: Self-pay

## 2023-12-16 DIAGNOSIS — C9110 Chronic lymphocytic leukemia of B-cell type not having achieved remission: Secondary | ICD-10-CM

## 2023-12-16 NOTE — Progress Notes (Signed)
 HEMATOLOGY/ONCOLOGY CLINIC NOTE  Date of Service: 12/17/2023   Patient Care Team: Fulbright, [name]  E, PA-C as PCP - General (Family Medicine) [name], MD as Consulting Physician (Hematology)  CHIEF COMPLAINTS/PURPOSE OF CONSULTATION:  Follow-up for continued management of chronic lymphocytic leukemia.  Oncologic History:  [name] with CLL after a CLL FISH study revealed heterozygous 13q deletion within 92% cells. Subsequent imaging showed diffuse non-bulky lymphadenopathy in chest, abdomen, and pelvis with splenomegaly of 1200 cm^3. The pt began daily Ibrutinib  420mg  on 01/27/17 after becoming increasing fatigued and lost 15 pounds of weight over 4-5 months.  He has now been off ibrutinib  for more than 6 months.  He was treated with Rituxan  weekly x4 to try to get a deeper response and achieved CR prior to discontinuation of his ibrutinib .  HISTORY OF PRESENTING ILLNESS:  Please see previous note for details on initial presentation  Interval History:   Wesley Bright is a 73 y.o. male here for follow-up of his CLL. Patient was last seen by me on 08/27/2023 and complained of abdominal pain sometimes, sometimes endorsed numbness in his legs, increased restless legs when sleeping which was mild, and urinary hesitancy.   Patient is accompanied by his wife during  He reports that he has been doing well overall over the last 6 months.   His wife reports that patient has been smoking slightly less that previously. Patient reports that he typically drinks half a gallon of water daily. He reports that he generally consumes 4 cups of coffee daily. Patient generally has not been physically active.   He reports that he generally does not follow with PCP and not inclined to receive regular age-appropriate screening.   Patient noted to have lost 4 pounds since October 2024 with current weight of 186 pounds.   He is noted to have a prominent vein in left  lower extremity. He otherwise denies any new lumps/bumps or enlarged lymph nodes.   His blood pressure in noted to be 123/77 in clinic today.   Patient denies any new lumps/bumps, enlarged lymph nodes, change in breathing, or abdominal pain. He does complain of some constipation which is not significantly bothersome.   MEDICAL HISTORY:  Past Medical History:  Diagnosis Date   External hemorrhoid    Hypertension    Leukemia (HCC) 2018    SURGICAL HISTORY: Past Surgical History:  Procedure Laterality Date   APPENDECTOMY     COLONOSCOPY  05/30/2020   Dr.Mansouraty   INGUINAL HERNIA REPAIR     left   KNEE SURGERY     Left Knee   UPPER GASTROINTESTINAL ENDOSCOPY  05/30/2020   Dr.Mansouraty    SOCIAL HISTORY: Social History   Socioeconomic History   Marital status: Married    Spouse name: Not on file   Number of children: 2   Years of education: Not on file   Highest education level: Not on file  Occupational History   Occupation: Event organiser: [employer]  Tobacco Use   Smoking status: Every Day    Current packs/day: 0.50    Average packs/day: 0.5 packs/day for 50.0 years (25.0 ttl pk-yrs)    Types: Cigarettes   Smokeless tobacco: Never  Vaping Use   Vaping status: Never Used  Substance and Sexual Activity   Alcohol use: Yes    Comment: occasional   Drug use: No   Sexual activity: Not on file  Other Topics Concern   Not  on file  Social History Narrative   Not on file   Social Drivers of Health   Financial Resource Strain: Not on file  Food Insecurity: Not on file  Transportation Needs: Not on file  Physical Activity: Not on file  Stress: Not on file  Social Connections: Not on file  Intimate Partner Violence: Not on file    FAMILY HISTORY: Family History  Problem Relation Age of Onset   Cancer Mother        Bladder - cause of death   Esophageal cancer Father    Cancer Father        Throat    Liver cancer Brother    Cancer  Brother        Liver   Pancreatic cancer Maternal Uncle    Stomach cancer Maternal Uncle    Pancreatic cancer Paternal Uncle    Colon cancer Neg Hx    Inflammatory bowel disease Neg Hx    Rectal cancer Neg Hx     ALLERGIES:  has no known allergies.  MEDICATIONS:  Current Outpatient Medications  Medication Sig Dispense Refill   Ascorbic Acid (VITAMIN C PO) Take by mouth.     Cholecalciferol (VITAMIN D3 PO) Take by mouth.     Cyanocobalamin  (VITAMIN B-12 PO) Take by mouth.     omeprazole  (PRILOSEC) 40 MG capsule Take 1 capsule (40 mg total) by mouth daily. 30 capsule 6   Pyridoxine HCl (VITAMIN B-6 PO) Take by mouth.     No current facility-administered medications for this visit.    REVIEW OF SYSTEMS:    10 Point review of Systems was done is negative except as noted above.   PHYSICAL EXAMINATION:  Vitals:   12/17/23 1041  BP: 123/77  Pulse: 62  Resp: 20  Temp: 97.7 F (36.5 C)  SpO2: 98%     Filed Weights   12/17/23 1041  Weight: 186 lb 11.2 oz (84.7 kg)     .Body mass index is 25.32 kg/m.   GENERAL:alert, in no acute distress and comfortable SKIN: no acute rashes, no significant lesions EYES: conjunctiva are pink and non-injected, sclera anicteric OROPHARYNX: MMM, no exudates, no oropharyngeal erythema or ulceration NECK: supple, no JVD LYMPH:  no palpable lymphadenopathy in the cervical, axillary or inguinal regions LUNGS: clear to auscultation b/l with normal respiratory effort HEART: regular rate & rhythm ABDOMEN:  normoactive bowel sounds , non tender, not distended. Extremity: no pedal edema PSYCH: alert & oriented x 3 with fluent speech NEURO: no focal motor/sensory deficits   LABORATORY DATA:  I have reviewed the data as listed  .    Latest Ref Rng & Units 12/17/2023   10:14 AM 08/27/2023    9:59 AM 03/01/2023    1:19 PM  CBC  WBC 4.0 - 10.5 K/uL 5.7  6.9  6.2   Hemoglobin 13.0 - 17.0 g/dL 83.3  81.2  83.0   Hematocrit 39.0 - 52.0 % 50.3   55.2  49.5   Platelets 150 - 400 K/uL 141  153  150     .    Latest Ref Rng & Units 12/17/2023   10:14 AM 08/27/2023    9:59 AM 03/01/2023    1:19 PM  CMP  Glucose 70 - 99 mg/dL 96  76  93   BUN 8 - 23 mg/dL 14  19  17    Creatinine 0.61 - 1.24 mg/dL 8.41  8.28  8.47   Sodium 135 - 145 mmol/L 139  140  139   Potassium 3.5 - 5.1 mmol/L 4.5  4.2  4.7   Chloride 98 - 111 mmol/L 108  107  107   CO2 22 - 32 mmol/L 26  27  27    Calcium  8.9 - 10.3 mg/dL 8.9  9.5  9.3   Total Protein 6.5 - 8.1 g/dL 7.0  7.6  6.9   Total Bilirubin 0.0 - 1.2 mg/dL 0.6  0.6  0.6   Alkaline Phos 38 - 126 U/L 116  128  125   AST 15 - 41 U/L 16  13  13    ALT 0 - 44 U/L 14  11  11     . Lab Results  Component Value Date   LDH 125 12/17/2023    06/20/2019 Kidney Biopsy:    12/31/16 Flow Cytometry:    12/15/16 CLL FISH:    RADIOGRAPHIC STUDIES: I have personally reviewed the radiological images as listed and agreed with the findings in the report. No results found.  ASSESSMENT & PLAN:   73 y.o. male with  1. Chronic Lymphocytic Leukemia - monoalleilic 13q deletion. Was started on Ibrutinib  from 01/2017 due to fatigue, weight loss and significant splenomegaly. S/p Rituxan  x 4 in 2021. Currently on active surveillance and off ibrutinib  after receiving Rituxan .  2. H/o Grade 1 intermittent rash ?related to Ibrutinib  - no currently an issue.  Off ibrutinib   3.  Chronic kidney disease likely related to hypertensive arteriolosclerosis. And possible obstructive uropathy. -Patient had a presyncopal episode at work and stopped taking all his medications.  He notes he is feeling well without taking his medications and so he did not restart these. -Patient was recommended to follow-up with his primary care physician for management of his antihypertensives and other medications. -He was recommended to maintain fluid intake with water intake of at least 2 L a day.  3. Secondary polycythemia due to smoking  and possible COPD Jak2 mutations neg making Polycythemia vera <1% likely. Plan -strongly counseled on smoking cessation. 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 4. Patient Active Problem List   Diagnosis Date Noted   Mixed hyperlipidemia 02/14/2021   Exertional dyspnea    Syncope and collapse 02/03/2021   Hx of adenomatous colonic polyps 04/09/2020   Abdominal distension 04/09/2020   Bloating 04/09/2020   History of duodenitis 04/09/2020   Counseling regarding advance care planning and goals of care 01/08/2020   Skin rash 10/03/2017   CLL (chronic lymphocytic leukemia) (HCC) 12/09/2016   General weakness 12/07/2016   Smoker 12/07/2016   Loss of weight 12/07/2016   GERD (gastroesophageal reflux disease) 12/30/2010   Chronic constipation 12/30/2010   Hemorrhoids, external 12/30/2010   PLAN:    -Discussed lab results on 12/17/2023 in detail with patient. CBC showed WBC of 5.7K, hemoglobin of 16.6, and platelets of 141K. -HGB and HCT normalized -WBCs normal -PLTs near-normal, which is not concerning -kidney function is stable -creatinine improved from 1.71 to 1.58 -there is no clinical sign or lab evidence of CLL progression at this time -there is no indication for additional treatment of the patient's CLL at this time.  -recommend smoking cessation -recommend gradually cutting back on smoking as much as he can -recommend consuming more fruits and vegetables in the diet to improve constipation -recommend to stay well-hydrated with at least 64 ounces of water daily -recommend to limit coffee intake to improve hydration status -recommend staying UTD with age-appropriate cancer screenings with PCP, including  prostate exam, colonoscopies, etc. However, patient is not inclined to receive age-appropriate cancer screenings at this time.  -answered all of patient's and his wife's questions in detail -patient shall  return to clinic in 6 months   FOLLOW-UP: RTC with Dr Onesimo with labs in 6 months  The total time spent in the appointment was 20 minutes* .  All of the patient's questions were answered with apparent satisfaction. The patient knows to call the clinic with any problems, questions or concerns.   Emaline Onesimo MD MS AAHIVMS [facility] Hematology/Oncology Physician [facility]  .*Total Encounter Time as defined by the Centers for Medicare and Medicaid Services includes, in addition to the face-to-face time of a patient visit (documented in the note above) non-face-to-face time: obtaining and reviewing outside history, ordering and reviewing medications, tests or procedures, care coordination (communications with other health care professionals or caregivers) and documentation in the medical record.    I,Mitra Faeizi,acting as a Neurosurgeon for Emaline Onesimo, MD.,have documented all relevant documentation on the behalf of Emaline Onesimo, MD,as directed by  Emaline Onesimo, MD while in the presence of Emaline Onesimo, MD.  .I have reviewed the above documentation for accuracy and completeness, and I agree with the above. .Cherisse Carrell Kishore Jarry Manon MD

## 2023-12-17 ENCOUNTER — Inpatient Hospital Stay

## 2023-12-17 ENCOUNTER — Inpatient Hospital Stay: Attending: Hematology | Admitting: Hematology

## 2023-12-17 VITALS — BP 123/77 | HR 62 | Temp 97.7°F | Resp 20 | Wt 186.7 lb

## 2023-12-17 DIAGNOSIS — D751 Secondary polycythemia: Secondary | ICD-10-CM | POA: Diagnosis not present

## 2023-12-17 DIAGNOSIS — Z860101 Personal history of adenomatous and serrated colon polyps: Secondary | ICD-10-CM | POA: Insufficient documentation

## 2023-12-17 DIAGNOSIS — Z9049 Acquired absence of other specified parts of digestive tract: Secondary | ICD-10-CM | POA: Diagnosis not present

## 2023-12-17 DIAGNOSIS — C9110 Chronic lymphocytic leukemia of B-cell type not having achieved remission: Secondary | ICD-10-CM

## 2023-12-17 DIAGNOSIS — R109 Unspecified abdominal pain: Secondary | ICD-10-CM | POA: Diagnosis not present

## 2023-12-17 DIAGNOSIS — R3911 Hesitancy of micturition: Secondary | ICD-10-CM | POA: Diagnosis not present

## 2023-12-17 DIAGNOSIS — R591 Generalized enlarged lymph nodes: Secondary | ICD-10-CM | POA: Insufficient documentation

## 2023-12-17 DIAGNOSIS — Z800 Family history of malignant neoplasm of digestive organs: Secondary | ICD-10-CM | POA: Diagnosis not present

## 2023-12-17 DIAGNOSIS — Z8719 Personal history of other diseases of the digestive system: Secondary | ICD-10-CM | POA: Diagnosis not present

## 2023-12-17 DIAGNOSIS — R161 Splenomegaly, not elsewhere classified: Secondary | ICD-10-CM | POA: Insufficient documentation

## 2023-12-17 DIAGNOSIS — R200 Anesthesia of skin: Secondary | ICD-10-CM | POA: Insufficient documentation

## 2023-12-17 DIAGNOSIS — R5383 Other fatigue: Secondary | ICD-10-CM | POA: Insufficient documentation

## 2023-12-17 DIAGNOSIS — G2581 Restless legs syndrome: Secondary | ICD-10-CM | POA: Insufficient documentation

## 2023-12-17 DIAGNOSIS — F17210 Nicotine dependence, cigarettes, uncomplicated: Secondary | ICD-10-CM | POA: Insufficient documentation

## 2023-12-17 DIAGNOSIS — Z79899 Other long term (current) drug therapy: Secondary | ICD-10-CM | POA: Diagnosis not present

## 2023-12-17 LAB — CBC WITH DIFFERENTIAL (CANCER CENTER ONLY)
Abs Immature Granulocytes: 0.01 K/uL (ref 0.00–0.07)
Basophils Absolute: 0.1 K/uL (ref 0.0–0.1)
Basophils Relative: 1 %
Eosinophils Absolute: 0.2 K/uL (ref 0.0–0.5)
Eosinophils Relative: 3 %
HCT: 50.3 % (ref 39.0–52.0)
Hemoglobin: 16.6 g/dL (ref 13.0–17.0)
Immature Granulocytes: 0 %
Lymphocytes Relative: 32 %
Lymphs Abs: 1.8 K/uL (ref 0.7–4.0)
MCH: 29.2 pg (ref 26.0–34.0)
MCHC: 33 g/dL (ref 30.0–36.0)
MCV: 88.6 fL (ref 80.0–100.0)
Monocytes Absolute: 0.5 K/uL (ref 0.1–1.0)
Monocytes Relative: 8 %
Neutro Abs: 3.2 K/uL (ref 1.7–7.7)
Neutrophils Relative %: 56 %
Platelet Count: 141 K/uL — ABNORMAL LOW (ref 150–400)
RBC: 5.68 MIL/uL (ref 4.22–5.81)
RDW: 14.1 % (ref 11.5–15.5)
WBC Count: 5.7 K/uL (ref 4.0–10.5)
nRBC: 0 % (ref 0.0–0.2)

## 2023-12-17 LAB — CMP (CANCER CENTER ONLY)
ALT: 14 U/L (ref 0–44)
AST: 16 U/L (ref 15–41)
Albumin: 4.1 g/dL (ref 3.5–5.0)
Alkaline Phosphatase: 116 U/L (ref 38–126)
Anion gap: 5 (ref 5–15)
BUN: 14 mg/dL (ref 8–23)
CO2: 26 mmol/L (ref 22–32)
Calcium: 8.9 mg/dL (ref 8.9–10.3)
Chloride: 108 mmol/L (ref 98–111)
Creatinine: 1.58 mg/dL — ABNORMAL HIGH (ref 0.61–1.24)
GFR, Estimated: 46 mL/min — ABNORMAL LOW (ref >60)
Glucose, Bld: 96 mg/dL (ref 70–99)
Potassium: 4.5 mmol/L (ref 3.5–5.1)
Sodium: 139 mmol/L (ref 135–145)
Total Bilirubin: 0.6 mg/dL (ref 0.0–1.2)
Total Protein: 7 g/dL (ref 6.5–8.1)

## 2023-12-17 LAB — LACTATE DEHYDROGENASE: LDH: 125 U/L (ref 98–192)

## 2024-03-07 DIAGNOSIS — Z0000 Encounter for general adult medical examination without abnormal findings: Secondary | ICD-10-CM | POA: Diagnosis not present

## 2024-03-07 DIAGNOSIS — R053 Chronic cough: Secondary | ICD-10-CM | POA: Diagnosis not present

## 2024-03-07 DIAGNOSIS — F17200 Nicotine dependence, unspecified, uncomplicated: Secondary | ICD-10-CM | POA: Diagnosis not present

## 2024-03-07 DIAGNOSIS — C9110 Chronic lymphocytic leukemia of B-cell type not having achieved remission: Secondary | ICD-10-CM | POA: Diagnosis not present

## 2024-06-22 ENCOUNTER — Other Ambulatory Visit: Payer: Self-pay

## 2024-06-22 DIAGNOSIS — C9110 Chronic lymphocytic leukemia of B-cell type not having achieved remission: Secondary | ICD-10-CM

## 2024-06-22 DIAGNOSIS — D751 Secondary polycythemia: Secondary | ICD-10-CM

## 2024-06-23 ENCOUNTER — Inpatient Hospital Stay

## 2024-06-23 ENCOUNTER — Inpatient Hospital Stay: Admitting: Hematology

## 2024-06-23 DIAGNOSIS — D751 Secondary polycythemia: Secondary | ICD-10-CM

## 2024-06-23 DIAGNOSIS — C9110 Chronic lymphocytic leukemia of B-cell type not having achieved remission: Secondary | ICD-10-CM

## 2024-06-23 LAB — CMP (CANCER CENTER ONLY)
ALT: 15 U/L (ref 0–44)
AST: 20 U/L (ref 15–41)
Albumin: 4.1 g/dL (ref 3.5–5.0)
Alkaline Phosphatase: 135 U/L — ABNORMAL HIGH (ref 38–126)
Anion gap: 9 (ref 5–15)
BUN: 10 mg/dL (ref 8–23)
CO2: 26 mmol/L (ref 22–32)
Calcium: 9.1 mg/dL (ref 8.9–10.3)
Chloride: 105 mmol/L (ref 98–111)
Creatinine: 1.49 mg/dL — ABNORMAL HIGH (ref 0.61–1.24)
GFR, Estimated: 50 mL/min — ABNORMAL LOW
Glucose, Bld: 82 mg/dL (ref 70–99)
Potassium: 4.5 mmol/L (ref 3.5–5.1)
Sodium: 141 mmol/L (ref 135–145)
Total Bilirubin: 0.5 mg/dL (ref 0.0–1.2)
Total Protein: 6.9 g/dL (ref 6.5–8.1)

## 2024-06-23 LAB — CBC WITH DIFFERENTIAL (CANCER CENTER ONLY)
Abs Immature Granulocytes: 0.01 10*3/uL (ref 0.00–0.07)
Basophils Absolute: 0.1 10*3/uL (ref 0.0–0.1)
Basophils Relative: 1 %
Eosinophils Absolute: 0.1 10*3/uL (ref 0.0–0.5)
Eosinophils Relative: 2 %
HCT: 51.8 % (ref 39.0–52.0)
Hemoglobin: 17.6 g/dL — ABNORMAL HIGH (ref 13.0–17.0)
Immature Granulocytes: 0 %
Lymphocytes Relative: 30 %
Lymphs Abs: 1.7 10*3/uL (ref 0.7–4.0)
MCH: 30 pg (ref 26.0–34.0)
MCHC: 34 g/dL (ref 30.0–36.0)
MCV: 88.2 fL (ref 80.0–100.0)
Monocytes Absolute: 0.5 10*3/uL (ref 0.1–1.0)
Monocytes Relative: 9 %
Neutro Abs: 3.3 10*3/uL (ref 1.7–7.7)
Neutrophils Relative %: 58 %
Platelet Count: 128 10*3/uL — ABNORMAL LOW (ref 150–400)
RBC: 5.87 MIL/uL — ABNORMAL HIGH (ref 4.22–5.81)
RDW: 13.3 % (ref 11.5–15.5)
WBC Count: 5.6 10*3/uL (ref 4.0–10.5)
nRBC: 0 % (ref 0.0–0.2)

## 2024-06-23 LAB — LACTATE DEHYDROGENASE: LDH: 158 U/L (ref 105–235)

## 2024-06-23 NOTE — Progress Notes (Incomplete)
 " HEMATOLOGY ONCOLOGY PROGRESS NOTE  Date of service: 06/23/2024  Patient Care Team: Fulbright, [name]  E, PA-C as PCP - General (Family Medicine) [name], MD as Consulting Physician (Hematology)  CHIEF COMPLAINT/PURPOSE OF CONSULTATION: Follow-up for continued evaluation and management of chronic lymphocytic leukemia.  Oncologic History:  [name] with CLL after a CLL FISH study revealed heterozygous 13q deletion within 92% cells. Subsequent imaging showed diffuse non-bulky lymphadenopathy in chest, abdomen, and pelvis with splenomegaly of 1200 cm^3. The pt began daily Ibrutinib  420mg  on 01/27/17 after becoming increasing fatigued and lost 15 pounds of weight over 4-5 months.  He has now been off ibrutinib  for more than 6 months.  He was treated with Rituxan  weekly x4 to try to get a deeper response and achieved CR prior to discontinuation of his ibrutinib .   HISTORY OF PRESENTING ILLNESS:  Please see previous note for details on initial presentation   SUMMARY OF ONCOLOGIC HISTORY: Oncology History  CLL (chronic lymphocytic leukemia) (HCC)  12/15/2016 Pathology Results   pBlood Flow/CLL FISH: Peripheral blood flow cytometry positive for presence of monoclonal B cells. IHC -- positive for CD5, CD19, CD20, CD21, CD22, CD23, HLA-DR, and lambda light-chains. FISH -- positive for heterozygous del13q, negative for del 17p, del 11q, and tr 12.   12/22/2016 Imaging   CT N/C/A/P: Diffuse non-bulky lymphadenopathy in the chest, abdomen, and pelvis and no significant lymphadenopathy in the neck. Splenomegaly with spleen measuring 13.8 cm.   12/28/2016 Initial Diagnosis   CLL (chronic lymphocytic leukemia) (HCC)   12/29/2016 Pathology Results   Bone Marrow Bx: The sections show a cellularity ranging from 40 to 70% focally with numerous variably sized interstitial and paratrabecular lymphoid aggregates in addition to interstitial infiltrates of primarily small  lymphoid cells with high nuclear cytoplasmic ratio, round to slightly irregular nuclei, dense chromatin and small to inconspicuous nucleoli. This is admixed with scattering of prolymphocytes/paraimmunoblasts including the presence of small proliferation centers in some aggregates. The background shows trilineage hematopoiesis including abundance of megakaryocytes. The lymphoid aggregates are overwhelmingly composed of B cells as seen with CD20 and CD79a associated with CD5 co-expression. Cyclin-D1 is generally negative although there are scattered positive cells most likely correlating with the presence of prolymphocytes/paraimmunoblasts. There is an admixed very minor T cell population as highlighted with CD3. Erythroid precursors: Relatively decreased in number but with orderly and progressive maturation. Granulocytic precursors: Relatively decreased in number but with orderly and progressive maturation. Megakaryocytes: Abundant with predominantly normal morphology. Lymphocytes/plasma cells: The lymphocytes are markedly increased in number representing 80% of all cells and mostly consist of small lymphoid cells with very high nuclear cytoplasmic ratio, round to slightly irregular nuclei, coarse chromatin and generally inconspicuous nucleoli. Large plasma cell aggregates are not present.    01/27/2017 -  Chemotherapy   Ibrutinib  420mg  PO QDay    01/30/2020 - 02/19/2020 Chemotherapy   Patient is on Treatment Plan : NON-HODGKINS LYMPHOMA Rituximab  q7d       INTERVAL HISTORY: Wesley Bright is a 74 y.o. male who is here today for continued evaluation and management of CLL. He is accompanied by his wife today.   he was last seen by me on 12/17/2023; at the time he did not have any concerns and was doing well.   Today, he reports he is feeling well. He reports he is eating and drinking well. His weight is stable.  He is taking allergy medication, Allegra.  He denies any fevers/chills, drenching night sweats,  unexpected  weight change, abdominal pain, new lumps/bumps, and bleeding issues (nose bleeds, gum bleeds, abnormal/spontaneous bruising).  His PCP is monitoring his lungs and PSA levels.    REVIEW OF SYSTEMS:   10 Point review of systems of done and is negative except as noted above.  MEDICAL HISTORY Past Medical History:  Diagnosis Date   External hemorrhoid    Hypertension    Leukemia (HCC) 2018    SURGICAL HISTORY Past Surgical History:  Procedure Laterality Date   APPENDECTOMY     COLONOSCOPY  05/30/2020   Dr.Mansouraty   INGUINAL HERNIA REPAIR     left   KNEE SURGERY     Left Knee   UPPER GASTROINTESTINAL ENDOSCOPY  05/30/2020   Dr.Mansouraty    SOCIAL HISTORY Social History[1]  Social History   Social History Narrative   Not on file    SOCIAL DRIVERS OF HEALTH SDOH Screenings   Food Insecurity: Low Risk (03/07/2024)   Received from [facility]  Housing: Low Risk (03/07/2024)   Received from [facility]  Transportation Needs: No Transportation Needs (03/07/2024)   Received from [facility]  Utilities: Low Risk (03/07/2024)   Received from [facility]  Depression (PHQ2-9): Low Risk (06/23/2024)  Tobacco Use: High Risk (02/12/2022)   Received from [facility]     FAMILY HISTORY Family History  Problem Relation Age of Onset   Cancer Mother        Bladder - cause of death   Esophageal cancer Father    Cancer Father        Throat    Liver cancer Brother    Cancer Brother        Liver   Pancreatic cancer Maternal Uncle    Stomach cancer Maternal Uncle    Pancreatic cancer Paternal Uncle    Colon cancer Neg Hx    Inflammatory bowel disease Neg Hx    Rectal cancer Neg Hx      ALLERGIES: has no known allergies.  MEDICATIONS  Current Outpatient Medications  Medication Sig Dispense Refill   Ascorbic Acid (VITAMIN C PO) Take by mouth.     Cholecalciferol (VITAMIN D3 PO) Take by mouth.     Cyanocobalamin  (VITAMIN B-12 PO) Take by mouth.      omeprazole  (PRILOSEC) 40 MG capsule Take 1 capsule (40 mg total) by mouth daily. 30 capsule 6   Pyridoxine HCl (VITAMIN B-6 PO) Take by mouth.     No current facility-administered medications for this visit.    PHYSICAL EXAMINATION: ECOG PERFORMANCE STATUS: 1 - Symptomatic but completely ambulatory VITALS: Vitals:   06/23/24 1056 06/23/24 1101  BP: (!) 119/97 (!) 144/93  Pulse: 72   Resp: 17   Temp: 97.7 F (36.5 C)   SpO2: 97%    Filed Weights   06/23/24 1055 06/23/24 1056  Weight: 192 lb 12.8 oz (87.5 kg) 192 lb 3.2 oz (87.2 kg)   Body mass index is 26.07 kg/m.  GENERAL: alert, in no acute distress and comfortable SKIN: no acute rashes, no significant lesions EYES: conjunctiva are pink and non-injected, sclera anicteric OROPHARYNX: MMM, no exudates, no oropharyngeal erythema or ulceration NECK: supple, no JVD LYMPH:  no palpable lymphadenopathy in the cervical, axillary or inguinal regions LUNGS: clear to auscultation b/l with normal respiratory effort HEART: regular rate & rhythm ABDOMEN:  normoactive bowel sounds , non tender, not distended, no hepatosplenomegaly Extremity: no pedal edema PSYCH: alert & oriented x 3 with fluent speech NEURO: no focal motor/sensory deficits  LABORATORY DATA:  I have reviewed the data as listed     Latest Ref Rng & Units 06/23/2024   10:09 AM 12/17/2023   10:14 AM 08/27/2023    9:59 AM  CBC EXTENDED  WBC 4.0 - 10.5 K/uL 5.6  5.7  6.9   RBC 4.22 - 5.81 MIL/uL 5.87  5.68  6.24   Hemoglobin 13.0 - 17.0 g/dL 82.3  83.3  81.2   HCT 39.0 - 52.0 % 51.8  50.3  55.2   Platelets 150 - 400 K/uL 128  141  153   NEUT# 1.7 - 7.7 K/uL 3.3  3.2  4.1   Lymph# 0.7 - 4.0 K/uL 1.7  1.8  2.0        Latest Ref Rng & Units 06/23/2024   10:09 AM 12/17/2023   10:14 AM 08/27/2023    9:59 AM  CMP  Glucose 70 - 99 mg/dL 82  96  76   BUN 8 - 23 mg/dL 10  14  19    Creatinine 0.61 - 1.24 mg/dL 8.50  8.41  8.28   Sodium 135 - 145 mmol/L 141  139  140    Potassium 3.5 - 5.1 mmol/L 4.5  4.5  4.2   Chloride 98 - 111 mmol/L 105  108  107   CO2 22 - 32 mmol/L 26  26  27    Calcium  8.9 - 10.3 mg/dL 9.1  8.9  9.5   Total Protein 6.5 - 8.1 g/dL 6.9  7.0  7.6   Total Bilirubin 0.0 - 1.2 mg/dL 0.5  0.6  0.6   Alkaline Phos 38 - 126 U/L 135  116  128   AST 15 - 41 U/L 20  16  13    ALT 0 - 44 U/L 15  14  11      06/20/2019 Kidney Biopsy:     12/31/16 Flow Cytometry:      12/15/16 CLL FISH:     RADIOGRAPHIC STUDIES: I have personally reviewed the radiological images as listed and agreed with the findings in the report. No results found.  ASSESSMENT & PLAN:  74 y.o. male with  1. Chronic Lymphocytic Leukemia - monoalleilic 13q deletion. Was started on Ibrutinib  from 01/2017 due to fatigue, weight loss and significant splenomegaly. S/p Rituxan  x 4 in 2021. Currently on active surveillance and off ibrutinib  after receiving Rituxan .   2. H/o Grade 1 intermittent rash ?related to Ibrutinib  - no currently an issue.  Off ibrutinib    3.  Chronic kidney disease likely related to hypertensive arteriolosclerosis. And possible obstructive uropathy. -Patient had a presyncopal episode at work and stopped taking all his medications.  He notes he is feeling well without taking his medications and so he did not restart these. -Patient was recommended to follow-up with his primary care physician for management of his antihypertensives and other medications. -He was recommended to maintain fluid intake with water intake of at least 2 L a day.   3. Secondary polycythemia due to smoking and possible COPD Jak2 mutations neg making Polycythemia vera <1% likely. Plan -strongly counseled on smoking cessation. 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 4.     Patient Active Problem List    Diagnosis Date Noted   Mixed hyperlipidemia 02/14/2021   Exertional dyspnea     Syncope and  collapse 02/03/2021   Hx of adenomatous colonic polyps 04/09/2020   Abdominal distension 04/09/2020   Bloating 04/09/2020   History of duodenitis  04/09/2020   Counseling regarding advance care planning and goals of care 01/08/2020   Skin rash 10/03/2017   CLL (chronic lymphocytic leukemia) (HCC) 12/09/2016   General weakness 12/07/2016   Smoker 12/07/2016   Loss of weight 12/07/2016   GERD (gastroesophageal reflux disease) 12/30/2010   Chronic constipation 12/30/2010   Hemorrhoids, external 12/30/2010    PLAN: - Discussed lab results on 06/23/2024 in detail with patient: -WBC are normal from a CLL standpoint, 5.6k -lymphocytes are also normal -Hemoglobin high at 17.6 -RBC 5.8, may be elevated due to smoking habit, no genetic markers for polycythemia vera -CMP  -kidney numbers are improving 1.49 today  -liver enzymes wnl -recommened he take Vitamin D and Vitamin B-complex -suggested he cut down his smoking  -we will send in repeat low dose CT scan (for lung cancer screening and checking for other CLL abnormalities)  FOLLOW-UP  RTC for visit with Dr. Onesimo in 6 months   The total time spent in the appointment was *** minutes* .  All of the patient's questions were answered and the patient knows to call the clinic with any problems, questions, or concerns.  Emaline Onesimo MD MS AAHIVMS [facility] Hematology/Oncology Physician [facility]  *Total Encounter Time as defined by the Centers for Medicare and Medicaid Services includes, in addition to the face-to-face time of a patient visit (documented in the note above) non-face-to-face time: obtaining and reviewing outside history, ordering and reviewing medications, tests or procedures, care coordination (communications with other health care professionals or caregivers) and documentation in the medical record.  I, Alan Blowers, acting as a neurosurgeon for Emaline Onesimo, MD.,have documented all relevant documentation on the behalf  of Emaline Onesimo, MD,as directed by  Emaline Onesimo, MD while in the presence of Emaline Onesimo, MD.  I have reviewed the above documentation for accuracy and completeness, and I agree with the above.  Emaline Onesimo, MD    [1]  Social History Tobacco Use   Smoking status: Every Day    Current packs/day: 0.50    Average packs/day: 0.5 packs/day for 50.0 years (25.0 ttl pk-yrs)    Types: Cigarettes   Smokeless tobacco: Never  Vaping Use   Vaping status: Never Used  Substance Use Topics   Alcohol use: Yes    Comment: occasional   Drug use: No   "
